# Patient Record
Sex: Female | Born: 1990
Health system: Southern US, Community
[De-identification: ages and names within clinical notes are randomized; demographics above are authoritative.]

## PROBLEM LIST (undated history)

## (undated) ENCOUNTER — Inpatient Hospital Stay (HOSPITAL_COMMUNITY): Payer: Self-pay

## (undated) DIAGNOSIS — F319 Bipolar disorder, unspecified: Secondary | ICD-10-CM

## (undated) DIAGNOSIS — R58 Hemorrhage, not elsewhere classified: Secondary | ICD-10-CM

## (undated) DIAGNOSIS — O139 Gestational [pregnancy-induced] hypertension without significant proteinuria, unspecified trimester: Secondary | ICD-10-CM

## (undated) DIAGNOSIS — F32A Depression, unspecified: Secondary | ICD-10-CM

## (undated) DIAGNOSIS — B999 Unspecified infectious disease: Secondary | ICD-10-CM

## (undated) DIAGNOSIS — R51 Headache: Secondary | ICD-10-CM

## (undated) DIAGNOSIS — R519 Headache, unspecified: Secondary | ICD-10-CM

## (undated) DIAGNOSIS — D649 Anemia, unspecified: Secondary | ICD-10-CM

## (undated) DIAGNOSIS — F329 Major depressive disorder, single episode, unspecified: Secondary | ICD-10-CM

## (undated) HISTORY — PX: FRACTURE SURGERY: SHX138

## (undated) HISTORY — PX: CYSTOLITHOTOMY: SHX1421

## (undated) HISTORY — PX: CYST REMOVAL HAND: SHX6279

---
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
## 2008-10-18 ENCOUNTER — Emergency Department (HOSPITAL_COMMUNITY): Admission: EM | Admit: 2008-10-18 | Discharge: 2008-10-19 | Payer: Self-pay | Admitting: Emergency Medicine

## 2009-05-20 ENCOUNTER — Emergency Department (HOSPITAL_COMMUNITY): Admission: EM | Admit: 2009-05-20 | Discharge: 2009-05-20 | Payer: Self-pay | Admitting: Emergency Medicine

## 2009-12-13 ENCOUNTER — Inpatient Hospital Stay (HOSPITAL_COMMUNITY): Admission: AD | Admit: 2009-12-13 | Discharge: 2009-12-16 | Payer: Self-pay | Admitting: Obstetrics & Gynecology

## 2011-01-23 ENCOUNTER — Emergency Department (HOSPITAL_COMMUNITY)
Admission: EM | Admit: 2011-01-23 | Discharge: 2011-01-23 | Disposition: A | Discharge status: Home / Self Care | Payer: Managed Care, Other (non HMO) | Attending: Emergency Medicine | Admitting: Emergency Medicine

## 2011-01-23 DIAGNOSIS — R3911 Hesitancy of micturition: Secondary | ICD-10-CM | POA: Insufficient documentation

## 2011-01-23 DIAGNOSIS — N390 Urinary tract infection, site not specified: Secondary | ICD-10-CM | POA: Insufficient documentation

## 2011-01-23 DIAGNOSIS — R109 Unspecified abdominal pain: Secondary | ICD-10-CM | POA: Insufficient documentation

## 2011-01-23 DIAGNOSIS — R350 Frequency of micturition: Secondary | ICD-10-CM | POA: Insufficient documentation

## 2011-01-23 DIAGNOSIS — R10819 Abdominal tenderness, unspecified site: Secondary | ICD-10-CM | POA: Insufficient documentation

## 2011-01-23 LAB — URINE MICROSCOPIC-ADD ON

## 2011-01-23 LAB — URINALYSIS, ROUTINE W REFLEX MICROSCOPIC
Glucose, UA: NEGATIVE mg/dL
Hgb urine dipstick: NEGATIVE
Protein, ur: NEGATIVE mg/dL
pH: 7 (ref 5.0–8.0)

## 2011-01-25 LAB — URINE CULTURE
Colony Count: >=100000
Culture  Setup Time: 201203162134

## 2011-01-28 LAB — CBC
HCT: 31.9 % — ABNORMAL LOW (ref 36.0–46.0)
HCT: 37.1 % (ref 36.0–46.0)
Hemoglobin: 10.4 g/dL — ABNORMAL LOW (ref 12.0–15.0)
Hemoglobin: 12.3 g/dL (ref 12.0–15.0)
MCHC: 32.6 g/dL (ref 30.0–36.0)
MCV: 87.7 fL (ref 78.0–100.0)
MCV: 89.1 fL (ref 78.0–100.0)
Platelets: 260 10*3/uL (ref 150–400)
RBC: 3.58 MIL/uL — ABNORMAL LOW (ref 3.87–5.11)
RBC: 4.23 MIL/uL (ref 3.87–5.11)
RDW: 16.6 % — ABNORMAL HIGH (ref 11.5–15.5)
WBC: 16.1 10*3/uL — ABNORMAL HIGH (ref 4.0–10.5)

## 2011-03-24 NOTE — Op Note (Signed)
NAMEESTRELLA, ALCARAZ            ACCOUNT NO.:  [id number]   MEDICAL RECORD NO.:  [id number]          PATIENT TYPE:  AMB   LOCATION:  DSC                          FACILITY:  [facility]   PHYSICIAN:  Tennis Must Meyerdierks, M.D.DATE OF BIRTH:  [birth year]   DATE OF PROCEDURE:  05/02/2007  DATE OF DISCHARGE:                               OPERATIVE REPORT   PREOPERATIVE DIAGNOSIS:  Mass right wrist.   POSTOPERATIVE DIAGNOSIS:  Dorsal ganglion, right wrist.   PROCEDURE:  Excision of dorsal ganglion, right wrist.   SURGEON:  Lowell Bouton, M.D.   ANESTHESIA:  General.   OPERATIVE FINDINGS:  The patient had a small cyst that appeared to arise  from the capsular tissue at the scapholunate interval.  There was  gelatinous fluid in it.   PROCEDURE:  Under general anesthesia with a tourniquet on the right arm,  the right hand was prepped and draped in the usual fashion and after  exsanguinating the limb, the tourniquet was inflated to 250 mmHg.  A  transverse incision was made over the dorsum of the wrist at the  radiocarpal joint.  Sharp dissection was carried through the  subcutaneous tissues and bleeding points were coagulated.  Blunt  dissection was carried through the subcutaneous tissues down to the  extensor tendon sheath and the tendons of the second and fourth dorsal  compartment were retracted radially and ulnarly.  Blunt dissection was  carried down to the capsule and a small dorsal ganglion was identified.  It was sharply dissected out with a knife and there was gelatinous fluid  present.  The cyst was completely excised along with some capsular  tissue.  After completely excising the cyst, the radiocarpal joint was  examined and was found to have no arthritic changes.  The wound was  irrigated copiously with saline.  The capsular tissue was repaired with  4-0 Vicryl.  Subcutaneous tissue was closed with 4-0 Vicryl and the skin  with a 3-0 subcuticular Prolene.   Steri-Strips were applied and 50%  Marcaine was inserted in the skin edges for pain control.  The patient  had a sterile dressing applied along with a volar wrist splint.  She  tolerated the procedure well and went to the recovery room awake and  stable in good condition.      Lowell Bouton, M.D.  Electronically Signed     EMM/MEDQ  D:  05/02/2007  T:  05/02/2007  Job:  [id number]

## 2011-08-14 LAB — URINALYSIS, ROUTINE W REFLEX MICROSCOPIC
Bilirubin Urine: NEGATIVE
Glucose, UA: NEGATIVE mg/dL
Protein, ur: NEGATIVE mg/dL

## 2011-08-14 LAB — WET PREP, GENITAL: Yeast Wet Prep HPF POC: NONE SEEN

## 2011-08-14 LAB — URINE MICROSCOPIC-ADD ON: RBC / HPF: NONE SEEN RBC/hpf (ref <3)

## 2011-08-14 LAB — GC/CHLAMYDIA PROBE AMP, GENITAL: Chlamydia, DNA Probe: NEGATIVE

## 2011-08-14 LAB — RPR: RPR Ser Ql: NONREACTIVE

## 2011-08-14 LAB — PREGNANCY, URINE: Preg Test, Ur: NEGATIVE

## 2012-05-20 ENCOUNTER — Emergency Department (HOSPITAL_COMMUNITY): Payer: Managed Care, Other (non HMO)

## 2012-05-20 ENCOUNTER — Emergency Department (HOSPITAL_COMMUNITY)
Admission: EM | Admit: 2012-05-20 | Discharge: 2012-05-20 | Disposition: A | Discharge status: Home / Self Care | Payer: Managed Care, Other (non HMO) | Attending: Emergency Medicine | Admitting: Emergency Medicine

## 2012-05-20 ENCOUNTER — Encounter (HOSPITAL_COMMUNITY): Payer: Self-pay | Admitting: Emergency Medicine

## 2012-05-20 DIAGNOSIS — R1011 Right upper quadrant pain: Secondary | ICD-10-CM | POA: Insufficient documentation

## 2012-05-20 DIAGNOSIS — N390 Urinary tract infection, site not specified: Secondary | ICD-10-CM

## 2012-05-20 DIAGNOSIS — R109 Unspecified abdominal pain: Secondary | ICD-10-CM

## 2012-05-20 LAB — CBC WITH DIFFERENTIAL/PLATELET
Eosinophils Relative: 1 % (ref 0–5)
HCT: 40.9 % (ref 36.0–46.0)
Lymphocytes Relative: 23 % (ref 12–46)
Lymphs Abs: 1.9 10*3/uL (ref 0.7–4.0)
MCH: 29.8 pg (ref 26.0–34.0)
MCV: 88.9 fL (ref 78.0–100.0)
Monocytes Absolute: 1 10*3/uL (ref 0.1–1.0)
RBC: 4.6 MIL/uL (ref 3.87–5.11)
WBC: 8.3 10*3/uL (ref 4.0–10.5)

## 2012-05-20 LAB — COMPREHENSIVE METABOLIC PANEL
BUN: 10 mg/dL (ref 6–23)
CO2: 25 mEq/L (ref 19–32)
Calcium: 9.7 mg/dL (ref 8.4–10.5)
Creatinine, Ser: 0.76 mg/dL (ref 0.50–1.10)
GFR calc Af Amer: >90 mL/min (ref >90)
GFR calc non Af Amer: >90 mL/min (ref >90)
Glucose, Bld: 89 mg/dL (ref 70–99)

## 2012-05-20 LAB — URINE MICROSCOPIC-ADD ON

## 2012-05-20 LAB — URINALYSIS, ROUTINE W REFLEX MICROSCOPIC
Nitrite: POSITIVE — AB
Protein, ur: NEGATIVE mg/dL
Urobilinogen, UA: 0.2 mg/dL (ref 0.0–1.0)

## 2012-05-20 LAB — POCT PREGNANCY, URINE: Preg Test, Ur: NEGATIVE

## 2012-05-20 LAB — LIPASE, BLOOD: Lipase: 20 U/L (ref 11–59)

## 2012-05-20 MED ORDER — ONDANSETRON HCL 4 MG/2ML IJ SOLN
4.0000 mg | Freq: Once | INTRAMUSCULAR | Status: AC
  Administered 2012-05-20: 4 mg via INTRAVENOUS
  Filled 2012-05-20: qty 2

## 2012-05-20 MED ORDER — ONDANSETRON 4 MG PO TBDP: 4.0000 mg | ORAL_TABLET | Freq: Three times a day (TID) | ORAL | Status: AC | PRN

## 2012-05-20 MED ORDER — NITROFURANTOIN MONOHYD MACRO 100 MG PO CAPS: 100.0000 mg | ORAL_CAPSULE | Freq: Two times a day (BID) | ORAL | Status: AC

## 2012-05-20 MED ORDER — HYDROCODONE-ACETAMINOPHEN 5-325 MG PO TABS: 1.0000 | ORAL_TABLET | ORAL | Status: AC | PRN

## 2012-05-20 MED ORDER — DIPHENOXYLATE-ATROPINE 2.5-0.025 MG PO TABS: 1.0000 | ORAL_TABLET | Freq: Four times a day (QID) | ORAL | Status: AC | PRN

## 2012-05-20 MED ORDER — MORPHINE SULFATE 4 MG/ML IJ SOLN
4.0000 mg | Freq: Once | INTRAMUSCULAR | Status: AC
  Administered 2012-05-20: 4 mg via INTRAVENOUS
  Filled 2012-05-20: qty 1

## 2012-05-20 NOTE — ED Notes (Signed)
Pt c/o RUQ pain x 4 days with diarrhea

## 2012-05-20 NOTE — ED Notes (Signed)
Pt. States RUQ abdominal pain X4 days that does not radiate. Abdomen soft and tender only in RUQ. Denies N/V or fevers. States Diarrhea X2 days.

## 2012-05-20 NOTE — ED Notes (Addendum)
Pt states she started to have rt upper abdominal pain about 4 days ago. Pt rates her pain at a 7 and states that the pain gets worst with movement. Pt states she has not taken anything for the pain today. Pt states she took 2 tylenol yesterday w/o relieve. Pt denies n/v but c/o having diarrhea. Pt states she had a bowel movement twice yesterday but not today. Pt states she has appetite but has not eating a lot in the last 3 days. Pt states she has been drinking fluids regularly

## 2012-05-20 NOTE — ED Provider Notes (Signed)
History     CSN: [id number]  Arrival date & time 05/20/12  1115   First MD Initiated Contact with Patient 05/20/12 1132      Chief Complaint  Patient presents with  . Abdominal Pain    (Consider location/radiation/quality/duration/timing/severity/associated sxs/prior treatment) HPI History from patient. 21 year old female who presents with right upper quadrant abdominal pain which has been present for the past 4 days. She states this is a sharp feeling which worsens with palpation and movement. It improves with rest. She took Tylenol which was minimally helpful in relieving the pain. She has never had this in the past. Associated with diarrhea. She denies any nausea, vomiting, urinary symptoms, vaginal bleeding or discharge. No fever or chills. Normal appetite.  History reviewed. No pertinent past medical history.  History reviewed. No pertinent past surgical history.  History reviewed. No pertinent family history.  History  Substance Use Topics  . Smoking status: Never Smoker   . Smokeless tobacco: Not on file  . Alcohol Use: Yes     occasional    OB History    Grav Para Term Preterm Abortions TAB SAB Ect Mult Living                  Review of Systems  Constitutional: Negative for fever, chills and appetite change.  Respiratory: Negative for shortness of breath.   Cardiovascular: Negative for chest pain.  Gastrointestinal: Positive for abdominal pain and diarrhea. Negative for nausea and vomiting.  Genitourinary: Negative for dysuria, vaginal bleeding and vaginal discharge.  Musculoskeletal: Negative for myalgias.  Skin: Negative for color change and rash.  Neurological: Negative for dizziness and weakness.  All other systems reviewed and are negative.    Allergies  Review of patient's allergies indicates no known allergies.  Home Medications   Current Outpatient Rx  Name Route Sig Dispense Refill  . LAMOTRIGINE 150 MG PO TABS Oral Take 150 mg by mouth at  bedtime.    Marland Kitchen RISPERIDONE 1 MG PO TABS Oral Take 1 mg by mouth at bedtime.      BP 114/65  Pulse 72  Temp 98.6 F (37 C) (Oral)  Resp 20  SpO2 100%  Physical Exam  Nursing note and vitals reviewed. Constitutional: She appears well-developed and well-nourished. No distress.  HENT:  Head: Normocephalic and atraumatic.  Mouth/Throat: Oropharynx is clear and moist.  Eyes:       Normal appearance  Neck: Normal range of motion.  Cardiovascular: Normal rate, regular rhythm and normal heart sounds.   Pulmonary/Chest: Effort normal and breath sounds normal. She exhibits no tenderness.  Abdominal: Soft. Bowel sounds are normal. There is tenderness.       Mildly tender to palpation to the right upper quadrant without rebound or guarding. Negative Murphy's sign.  Musculoskeletal: Normal range of motion.  Neurological: She is alert.  Skin: Skin is warm and dry. She is not diaphoretic.  Psychiatric: She has a normal mood and affect.    ED Course  Procedures (including critical care time)  Labs Reviewed  CBC WITH DIFFERENTIAL - Abnormal; Notable for the following:    Monocytes Relative 13 (*)     All other components within normal limits  COMPREHENSIVE METABOLIC PANEL - Abnormal; Notable for the following:    Total Bilirubin 0.2 (*)     All other components within normal limits  URINALYSIS, ROUTINE W REFLEX MICROSCOPIC - Abnormal; Notable for the following:    APPearance CLOUDY (*)     Hgb urine dipstick  SMALL (*)     Nitrite POSITIVE (*)     Leukocytes, UA MODERATE (*)     All other components within normal limits  URINE MICROSCOPIC-ADD ON - Abnormal; Notable for the following:    Squamous Epithelial / LPF FEW (*)     Bacteria, UA MANY (*)     All other components within normal limits  LIPASE, BLOOD  POCT PREGNANCY, URINE   US Abdomen Complete  05/20/2012  *RADIOLOGY REPORT*  Clinical Data:  Right upper quadrant pain  ULTRASOUND ABDOMEN:  Technique:  Sonography of upper  abdominal structures was performed.  Comparison:  03/19/2005  Gallbladder:  Normally distended without stones or wall thickening. No pericholecystic fluid or sonographic Murphy sign.  Common bile duct:  7.3 mm diameter proximally tapering to 5.5 mm diameter. No intrahepatic biliary dilatation  Liver:  Normal appearance  IVC:  Normal appearance  Pancreas:  Normal appearance  Spleen:  Normal appearance, the 7.7 cm length  Right kidney:  10.5 cm length.  Tiny cyst 1.3 cm greatest size.  No solid mass or hydronephrosis.  Left kidney:  12.2 cm length. Normal morphology without mass or hydronephrosis.  Aorta:  Normal caliber  Other:  No free fluid  IMPRESSION: Tiny right renal cyst. Minimal dilatation of the CBD though this does taper slightly more distally. Correlation with LFTs recommended.  Original Report Authenticated By: Lollie Marrow, M.D.     1. Abdominal pain   2. Urinary tract infection       MDM  Pt presents with pain to RUQ x 4 days. On exam, slight tenderness to the same, neg Murphy's. Normal vitals. Pt's labs appear reassuring with the exception of UTI. No GB disease per [place]. Pt will be treated with macrobid for UTI and prescriptions given for symptomatic tx. Reasons to return for worsening, changing, or new symptoms discussed.        Grant Fontana, PA-C 05/21/12 [phone]

## 2012-05-20 NOTE — ED Notes (Signed)
Reassessed pt pain/ pt stated it is at a 5

## 2012-05-20 NOTE — ED Notes (Signed)
Pt is in ultrasound. Will come to c[facility] after 

## 2012-05-21 NOTE — ED Provider Notes (Signed)
Medical screening examination/treatment/procedure(s) were performed by non-physician practitioner and as supervising physician I was immediately available for consultation/collaboration.  Doug Sou, MD 05/21/12 [phone]

## 2012-07-09 ENCOUNTER — Emergency Department (HOSPITAL_COMMUNITY)
Admission: EM | Admit: 2012-07-09 | Discharge: 2012-07-09 | Disposition: A | Discharge status: Home / Self Care | Payer: Managed Care, Other (non HMO) | Attending: Emergency Medicine | Admitting: Emergency Medicine

## 2012-07-09 ENCOUNTER — Encounter (HOSPITAL_COMMUNITY): Payer: Self-pay | Admitting: *Deleted

## 2012-07-09 DIAGNOSIS — F319 Bipolar disorder, unspecified: Secondary | ICD-10-CM | POA: Insufficient documentation

## 2012-07-09 DIAGNOSIS — R109 Unspecified abdominal pain: Secondary | ICD-10-CM | POA: Insufficient documentation

## 2012-07-09 DIAGNOSIS — N390 Urinary tract infection, site not specified: Secondary | ICD-10-CM | POA: Insufficient documentation

## 2012-07-09 DIAGNOSIS — R110 Nausea: Secondary | ICD-10-CM | POA: Insufficient documentation

## 2012-07-09 HISTORY — DX: Bipolar disorder, unspecified: F31.9

## 2012-07-09 LAB — URINALYSIS, ROUTINE W REFLEX MICROSCOPIC
Bilirubin Urine: NEGATIVE
Glucose, UA: NEGATIVE mg/dL
Ketones, ur: NEGATIVE mg/dL
Leukocytes, UA: NEGATIVE
Nitrite: NEGATIVE
Protein, ur: 100 mg/dL — AB
Specific Gravity, Urine: 1.02 (ref 1.005–1.030)
Urobilinogen, UA: 0.2 mg/dL (ref 0.0–1.0)
pH: 7 (ref 5.0–8.0)

## 2012-07-09 LAB — BASIC METABOLIC PANEL WITH GFR
BUN: 11 mg/dL (ref 6–23)
CO2: 23 meq/L (ref 19–32)
Calcium: 9.2 mg/dL (ref 8.4–10.5)
Chloride: 97 meq/L (ref 96–112)
Creatinine, Ser: 0.93 mg/dL (ref 0.50–1.10)
GFR calc Af Amer: >90 mL/min
GFR calc non Af Amer: 86 mL/min — ABNORMAL LOW
Glucose, Bld: 101 mg/dL — ABNORMAL HIGH (ref 70–99)
Potassium: 3.8 meq/L (ref 3.5–5.1)
Sodium: 132 meq/L — ABNORMAL LOW (ref 135–145)

## 2012-07-09 LAB — CBC
HCT: 36.6 % (ref 36.0–46.0)
Hemoglobin: 12.6 g/dL (ref 12.0–15.0)
MCH: 30.1 pg (ref 26.0–34.0)
MCV: 87.6 fL (ref 78.0–100.0)
RBC: 4.18 MIL/uL (ref 3.87–5.11)
WBC: 16.5 10*3/uL — ABNORMAL HIGH (ref 4.0–10.5)

## 2012-07-09 LAB — PREGNANCY, URINE: Preg Test, Ur: NEGATIVE

## 2012-07-09 LAB — URINE MICROSCOPIC-ADD ON

## 2012-07-09 MED ORDER — DEXTROSE 5 % IV SOLN
1.0000 g | Freq: Once | INTRAVENOUS | Status: AC
  Administered 2012-07-09: 1 g via INTRAVENOUS
  Filled 2012-07-09: qty 10

## 2012-07-09 MED ORDER — ACETAMINOPHEN 500 MG PO TABS
1000.0000 mg | ORAL_TABLET | Freq: Once | ORAL | Status: AC
  Administered 2012-07-09: 1000 mg via ORAL
  Filled 2012-07-09: qty 2

## 2012-07-09 MED ORDER — SODIUM CHLORIDE 0.9 % IV SOLN
Freq: Once | INTRAVENOUS | Status: AC
  Administered 2012-07-09: 16:00:00 via INTRAVENOUS

## 2012-07-09 NOTE — ED Notes (Signed)
Right flank pain began 2 days ago. Seen by PMD yesterday and dx with UTI. Pt states she isn't feeling any better today.

## 2012-07-09 NOTE — ED Provider Notes (Signed)
History   This chart was scribed for Peggy Lennert, MD scribed by Magnus Sinning. The patient was seen in room APA05/APA05 at 1613   CSN: [id number]  Arrival date & time 07/09/12  1447   First MD Initiated Contact with Patient 07/09/12 1613      Chief Complaint  Patient presents with  . Flank Pain    (Consider location/radiation/quality/duration/timing/severity/associated sxs/prior treatment) HPI Comments: Peggy Mckenzie is a 21 y.o. female who presents to the Emergency Department complaining of constant moderate right sided flank pain with associated nausea.  Patient states she found out yesterday with PCP that she had a UTI, and explains she was started on an abx.She states she has taken the abx as prescribed, as well as tylenol with mild relief. Patient denies emesis and does not report any other complaints at this time.   Patient is a 21 y.o. female presenting with flank pain. The history is provided by the patient. No language interpreter was used.  Flank Pain This is a new problem. The current episode started yesterday. The problem occurs constantly. The problem has not changed since onset.Pertinent negatives include no chest pain, no abdominal pain and no headaches. Nothing aggravates the symptoms. Nothing relieves the symptoms. She has tried acetaminophen for the symptoms. The treatment provided mild relief.    Past Medical History  Diagnosis Date  . Bipolar 1 disorder     History reviewed. No pertinent past surgical history.  No family history on file.  History  Substance Use Topics  . Smoking status: Never Smoker   . Smokeless tobacco: Not on file  . Alcohol Use: Yes     occasional    Review of Systems  Constitutional: Negative for fatigue.  HENT: Negative for congestion, sinus pressure and ear discharge.   Eyes: Negative for discharge.  Respiratory: Negative for cough.   Cardiovascular: Negative for chest pain.  Gastrointestinal: Positive for nausea.  Negative for vomiting, abdominal pain and diarrhea.  Genitourinary: Positive for flank pain. Negative for frequency and hematuria.  Musculoskeletal: Negative for back pain.  Skin: Negative for rash.  Neurological: Negative for seizures and headaches.  Hematological: Negative.   Psychiatric/Behavioral: Negative for hallucinations.  All other systems reviewed and are negative.    Allergies  Review of patient's allergies indicates no known allergies.  Home Medications   Current Outpatient Rx  Name Route Sig Dispense Refill  . CIPROFLOXACIN HCL 500 MG PO TABS Oral Take 500 mg by mouth 2 (two) times daily. Patient picked up on 07/08/12    . LAMOTRIGINE 150 MG PO TABS Oral Take 150 mg by mouth at bedtime.      BP 120/68  Pulse 120  Temp 102.9 F (39.4 C) (Oral)  Resp 18  Ht 5\' 3"  (1.6 m)  Wt 160 lb (72.576 kg)  BMI 28.34 kg/m2  SpO2 99%  LMP 06/18/2012  Physical Exam  Nursing note and vitals reviewed. Constitutional: She is oriented to person, place, and time. She appears well-developed and well-nourished. No distress.  HENT:  Head: Normocephalic and atraumatic.  Eyes: Conjunctivae and EOM are normal.  Neck: Neck supple.  Cardiovascular: Normal rate.   Abdominal: She exhibits no distension. There is tenderness. There is no rebound.       Mild right flank tenderness   Musculoskeletal: Normal range of motion. She exhibits no edema.  Neurological: She is alert and oriented to person, place, and time.  Skin: Skin is warm and dry. She is not diaphoretic.  Psychiatric:  She has a normal mood and affect. Her behavior is normal.    ED Course  Procedures (including critical care time) DIAGNOSTIC STUDIES: Oxygen Saturation is 99% on room air, normal by my interpretation.    COORDINATION OF CARE: 16:20: Physical exam started. 16:21: Physical exam completed.  18:33: Performed recheck. Noted results still show the kidney infection and provided recommendations to drink plenty of  fluids, continue to take the abx and tylenol. Patient agreeable. Labs Reviewed  URINALYSIS, ROUTINE W REFLEX MICROSCOPIC - Abnormal; Notable for the following:    Hgb urine dipstick MODERATE (*)     Protein, ur 100 (*)     All other components within normal limits  CBC - Abnormal; Notable for the following:    WBC 16.5 (*)     All other components within normal limits  BASIC METABOLIC PANEL - Abnormal; Notable for the following:    Sodium 132 (*)     Glucose, Bld 101 (*)     GFR calc non Af Amer 86 (*)     All other components within normal limits  URINE MICROSCOPIC-ADD ON - Abnormal; Notable for the following:    Squamous Epithelial / LPF MANY (*)     Bacteria, UA FEW (*)     All other components within normal limits  PREGNANCY, URINE   No results found.   No diagnosis found.    MDM   The chart was scribed for me under my direct supervision.  I personally performed the history, physical, and medical decision making and all procedures in the evaluation of this patient.Peggy Lennert, MD 07/09/12 [phone]

## 2012-07-09 NOTE — ED Notes (Signed)
Pt states she is feeling better .

## 2012-07-09 NOTE — ED Notes (Addendum)
Pt diaphoretic, hot to touch, temp 102.9

## 2012-07-09 NOTE — ED Notes (Signed)
Discharge instructions reviewed with pt, questions answered. Pt verbalized understanding.  

## 2013-06-09 ENCOUNTER — Ambulatory Visit (INDEPENDENT_AMBULATORY_CARE_PROVIDER_SITE_OTHER): Payer: Managed Care, Other (non HMO) | Admitting: Family Medicine

## 2013-06-09 ENCOUNTER — Encounter: Payer: Self-pay | Admitting: Family Medicine

## 2013-06-09 VITALS — BP 110/72 | HR 78 | Temp 97.7°F | Resp 18 | Wt 170.0 lb

## 2013-06-09 DIAGNOSIS — R109 Unspecified abdominal pain: Secondary | ICD-10-CM

## 2013-06-09 LAB — CBC WITH DIFFERENTIAL/PLATELET
Basophils Absolute: 0 10*3/uL (ref 0.0–0.1)
Basophils Relative: 0 % (ref 0–1)
Eosinophils Absolute: 0.2 10*3/uL (ref 0.0–0.7)
Eosinophils Relative: 2 % (ref 0–5)
MCH: 30.6 pg (ref 26.0–34.0)
MCV: 89 fL (ref 78.0–100.0)
Neutrophils Relative %: 65 % (ref 43–77)
Platelets: 331 10*3/uL (ref 150–400)
RBC: 4.44 MIL/uL (ref 3.87–5.11)
RDW: 13.4 % (ref 11.5–15.5)

## 2013-06-09 MED ORDER — NAPROXEN 500 MG PO TABS: 500.0000 mg | ORAL_TABLET | Freq: Two times a day (BID) | ORAL | Status: DC

## 2013-06-09 NOTE — Progress Notes (Signed)
  Subjective:    Patient ID: Peggy Mckenzie, female    DOB: 13-Oct-1991, 22 y.o.   MRN: [id number]  HPI  Patient here with left side pain for the past 6 weeks. She states in the beginning was on and off and will be a dull ache for about an hour. Over the past week she's had increased pain still described as a dull aching in her left side it is nonradiating. She denies any particular injury. Denies any upper respiratory infection or any cough. She denies any dysuria. She did state that a few episodes the pain was worse with eating she denies any nausea vomiting. She has not taken any medication.  Review of Systems - per above  GEN- denies fatigue, fever, weight loss,weakness, recent illness CVS- denies chest pain, palpitations RESP- denies SOB, cough, wheeze ABD- denies N/V, change in stools, abd pain GU- denies dysuria, hematuria, dribbling, incontinence MSK- denies joint pain, +muscle aches, injury        Objective:   Physical Exam GEN- NAD, alert and oriented x3 CVS- RRR, no murmur RESP-CTAB ABD-NABS,soft,NT,ND, no CVA tenderness, no HSM, neg Rosvings MSK- TTP over T11 left side, no brusing noted, Spine NT, neg SLR, HIP FROM EXT- No edema Pulses- Radial 2+        Assessment & Plan:

## 2013-06-09 NOTE — Patient Instructions (Addendum)
Use the heating pad  Take the naprosyn twice a day with food We will call with labs

## 2013-06-10 LAB — COMPREHENSIVE METABOLIC PANEL
ALT: 9 U/L (ref 0–35)
Albumin: 4.6 g/dL (ref 3.5–5.2)
Alkaline Phosphatase: 58 U/L (ref 39–117)
CO2: 25 mEq/L (ref 19–32)
Glucose, Bld: 72 mg/dL (ref 70–99)
Potassium: 4.5 mEq/L (ref 3.5–5.3)
Sodium: 137 mEq/L (ref 135–145)
Total Bilirubin: 0.5 mg/dL (ref 0.3–1.2)
Total Protein: 6.9 g/dL (ref 6.0–8.3)

## 2013-06-11 ENCOUNTER — Encounter: Payer: Self-pay | Admitting: Family Medicine

## 2013-06-11 DIAGNOSIS — R109 Unspecified abdominal pain: Secondary | ICD-10-CM | POA: Insufficient documentation

## 2013-06-11 NOTE — Assessment & Plan Note (Signed)
I think this is more MSK pain, ? Related to her diet. Will check [facility] for LFT and alk phos Advised NSAIDS and heating pad ABdominal exam is benign, no urinary symptoms, no vaginal symptoms

## 2013-06-28 ENCOUNTER — Other Ambulatory Visit: Payer: Self-pay | Admitting: Family Medicine

## 2013-06-28 ENCOUNTER — Ambulatory Visit (INDEPENDENT_AMBULATORY_CARE_PROVIDER_SITE_OTHER): Payer: Managed Care, Other (non HMO) | Admitting: Family Medicine

## 2013-06-28 ENCOUNTER — Encounter: Payer: Self-pay | Admitting: Family Medicine

## 2013-06-28 VITALS — BP 130/80 | HR 70 | Temp 99.0°F | Ht 61.5 in | Wt 171.0 lb

## 2013-06-28 DIAGNOSIS — B9689 Other specified bacterial agents as the cause of diseases classified elsewhere: Secondary | ICD-10-CM

## 2013-06-28 DIAGNOSIS — N760 Acute vaginitis: Secondary | ICD-10-CM

## 2013-06-28 DIAGNOSIS — R109 Unspecified abdominal pain: Secondary | ICD-10-CM

## 2013-06-28 DIAGNOSIS — A499 Bacterial infection, unspecified: Secondary | ICD-10-CM

## 2013-06-28 LAB — URINALYSIS, ROUTINE W REFLEX MICROSCOPIC
Hgb urine dipstick: NEGATIVE
Ketones, ur: NEGATIVE mg/dL
Leukocytes, UA: NEGATIVE
Nitrite: NEGATIVE
Protein, ur: NEGATIVE mg/dL
Urobilinogen, UA: 0.2 mg/dL (ref 0.0–1.0)

## 2013-06-28 LAB — URINALYSIS, MICROSCOPIC ONLY
Casts: NONE SEEN
Crystals: NONE SEEN

## 2013-06-28 LAB — WET PREP FOR TRICH, YEAST, CLUE: Trich, Wet Prep: NONE SEEN

## 2013-06-28 MED ORDER — METRONIDAZOLE 500 MG PO TABS: 500.0000 mg | ORAL_TABLET | Freq: Two times a day (BID) | ORAL | Status: DC

## 2013-06-28 NOTE — Progress Notes (Signed)
  Subjective:    Patient ID: Peggy Mckenzie, female    DOB: 24-Jan-1991, 24 y.o.   MRN: [id number]  HPI  Pt here with right lower abdominal and pelvic pain for the past week. Feel sharp pains on and off. Denies change in stool, +nausea, no emesis. Denies dysuria. LMP 8/2 but only lasted 2 days. Mild vaginal discharge but no itching or foul odor. She did note things floating in urine.     Review of Systems   GEN- denies fatigue, fever, weight loss,weakness, recent illness CVS- denies chest pain, palpitations RESP- denies SOB, cough, wheeze ABD- denies N/V, change in stools, +abd pain GU- denies dysuria, hematuria, dribbling, incontinence MSK- denies joint pain, muscle aches, injury Neuro- denies headache, dizziness, syncope, seizure activity      Objective:   Physical Exam GEN- NAD, alert and oriented x3 CVS- RRR, no murmur RESP-CTAB ABD-NABS,soft,ND,mild TTP right side of pelvis, no no mass felt no rebound GU- normal external genitalia, no bleeding noted, yellow discharge from os, no CMT, uterus normal size, no ovarian mass, vaginal mucosa pink EXT- No edema Pulses- Radial, DP- 2+        Assessment & Plan:

## 2013-06-28 NOTE — Patient Instructions (Signed)
Take the antibiotics as prescribed If your pain worsens you will need CT scan  F/U as needed

## 2013-06-29 DIAGNOSIS — B9689 Other specified bacterial agents as the cause of diseases classified elsewhere: Secondary | ICD-10-CM | POA: Insufficient documentation

## 2013-06-29 DIAGNOSIS — R109 Unspecified abdominal pain: Secondary | ICD-10-CM | POA: Insufficient documentation

## 2013-06-29 NOTE — Assessment & Plan Note (Signed)
Seen earlier this month for left side pain now resolved Her abd exam is fairly benign, doubt appendicitis , pain more in pelvic region when she is pointing UA neg, U preg neg Recent labs neg Treat for BV If pain worsens obtain CT abd/pelvis

## 2013-06-29 NOTE — Assessment & Plan Note (Signed)
Flagyl x 7 days

## 2013-09-14 ENCOUNTER — Ambulatory Visit (INDEPENDENT_AMBULATORY_CARE_PROVIDER_SITE_OTHER): Payer: Managed Care, Other (non HMO) | Admitting: Physician Assistant

## 2013-09-14 ENCOUNTER — Other Ambulatory Visit: Payer: Self-pay | Admitting: Physician Assistant

## 2013-09-14 ENCOUNTER — Encounter: Payer: Self-pay | Admitting: Physician Assistant

## 2013-09-14 VITALS — BP 118/84 | HR 68 | Temp 98.5°F | Resp 18 | Wt 175.0 lb

## 2013-09-14 DIAGNOSIS — N760 Acute vaginitis: Secondary | ICD-10-CM

## 2013-09-14 DIAGNOSIS — N912 Amenorrhea, unspecified: Secondary | ICD-10-CM

## 2013-09-14 DIAGNOSIS — B9689 Other specified bacterial agents as the cause of diseases classified elsewhere: Secondary | ICD-10-CM

## 2013-09-14 DIAGNOSIS — A499 Bacterial infection, unspecified: Secondary | ICD-10-CM

## 2013-09-14 LAB — PREGNANCY, URINE: Preg Test, Ur: NEGATIVE

## 2013-09-14 LAB — WET PREP FOR TRICH, YEAST, CLUE

## 2013-09-14 MED ORDER — METRONIDAZOLE 500 MG PO TABS: 500.0000 mg | ORAL_TABLET | Freq: Two times a day (BID) | ORAL | Status: DC

## 2013-09-14 NOTE — Progress Notes (Signed)
   Patient ID: SHAMIYAH NGU MRN: [id number], DOB: 05/28/1991, 22 y.o. Date of Encounter: 09/14/2013, 4:21 PM    Chief Complaint:  Chief Complaint  Patient presents with  . c/o vag inf, late menses     HPI: 22 y.o. year old female has complaints of vaginal itching and irritation. Also has noticed some vaginal white discharge.  Also reports that her periods are usually very regular and every 28 days. Says that her last period was 08/11/13. Says she has done a home pregnancy test that was negative but wanted to get this checked.     Home Meds: See attached medication section for any medications that were entered at today's visit. The computer does not put those onto this list.The following list is a list of meds entered prior to today's visit.   Current Outpatient Prescriptions on File Prior to Visit  Medication Sig Dispense Refill  . lamoTRIgine ([name]) 150 MG tablet Take 150 mg by mouth at bedtime.       No current facility-administered medications on file prior to visit.    Allergies: No Known Allergies    Review of Systems: See HPI for pertinent ROS. All other ROS negative.    Physical Exam: Blood pressure 118/84, pulse 68, temperature 98.5 F (36.9 C), temperature source Oral, resp. rate 18, weight 175 lb (79.379 kg), last menstrual period 08/11/2013., Body mass index is 32.53 kg/(m^2). General:  Alert well-developed Hispanic female .Appears in no acute distress. Lungs: Clear bilaterally to auscultation without wheezes, rales, or rhonchi. Breathing is unlabored. Heart: Regular rhythm. No murmurs, rubs, or gallops. Abdomen: Soft, non-tender, non-distended with normoactive bowel sounds. No hepatomegaly. No rebound/guarding. No obvious abdominal masses. Msk:  Strength and tone normal for age. Pelvic exam: External genitalia normal. Vaginal mucosa normal. Cervix normal. There is a moderate amount of liquidy white discharge. Bimanual exam is normal with no cervical motion  tenderness and no adnexal or uterine mass. Extremities/Skin: Warm and dry. No clubbing or cyanosis. No edema. No rashes or suspicious lesions. Neuro: Alert and oriented X 3. Moves all extremities spontaneously. Gait is normal. CNII-XII grossly in tact. Psych:  Responds to questions appropriately with a normal affect.   Results for orders placed in visit on 09/14/13  WET PREP FOR TRICH, YEAST, CLUE      Result Value Range   Yeast Wet Prep HPF POC NONE SEEN  NONE SEEN   Trich, Wet Prep NONE SEEN  NONE SEEN   Clue Cells Wet Prep HPF POC FEW (*) NONE SEEN   WBC, Wet Prep HPF POC FEW  NONE SEEN  PREGNANCY, URINE      Result Value Range   Preg Test, Ur NEG       ASSESSMENT AND PLAN:  22 y.o. year old female with  1. Bacterial vaginosis - metroNIDAZOLE (FLAGYL) 500 MG tablet; Take 1 tablet (500 mg total) by mouth 2 (two) times daily.  Dispense: 14 tablet; Refill: 0  2. Vaginitis and vulvovaginitis - WET PREP FOR TRICH, YEAST, CLUE - GC/chlamydia probe amp, genital  3. Amenorrhea Discussed that her irregular menses may be secondary to hormone fluctuations. Also can occur with stress. Told her to monitor for now. If menses continue to be irregular in followup for further evaluation. As well I discussed contraception and she says that she does not want contraception. - Pregnancy, urine   Signed, [address] [place], [place], [facility] 09/14/2013 4:21 PM

## 2013-09-15 ENCOUNTER — Ambulatory Visit: Payer: Managed Care, Other (non HMO) | Admitting: Family Medicine

## 2013-09-15 LAB — GC/CHLAMYDIA PROBE AMP: CT Probe RNA: NEGATIVE

## 2013-09-24 ENCOUNTER — Encounter (HOSPITAL_COMMUNITY): Payer: Self-pay | Admitting: Emergency Medicine

## 2013-09-24 DIAGNOSIS — B86 Scabies: Secondary | ICD-10-CM | POA: Insufficient documentation

## 2013-09-24 DIAGNOSIS — Z79899 Other long term (current) drug therapy: Secondary | ICD-10-CM | POA: Insufficient documentation

## 2013-09-24 DIAGNOSIS — J40 Bronchitis, not specified as acute or chronic: Secondary | ICD-10-CM | POA: Insufficient documentation

## 2013-09-24 DIAGNOSIS — Z792 Long term (current) use of antibiotics: Secondary | ICD-10-CM | POA: Insufficient documentation

## 2013-09-24 DIAGNOSIS — F319 Bipolar disorder, unspecified: Secondary | ICD-10-CM | POA: Insufficient documentation

## 2013-09-25 ENCOUNTER — Emergency Department (HOSPITAL_COMMUNITY)
Admission: EM | Admit: 2013-09-25 | Discharge: 2013-09-25 | Disposition: A | Discharge status: Home / Self Care | Payer: Managed Care, Other (non HMO) | Attending: Emergency Medicine | Admitting: Emergency Medicine

## 2013-09-25 DIAGNOSIS — J40 Bronchitis, not specified as acute or chronic: Secondary | ICD-10-CM

## 2013-09-25 DIAGNOSIS — B029 Zoster without complications: Secondary | ICD-10-CM

## 2013-09-25 MED ORDER — FAMCICLOVIR 500 MG PO TABS: 500.0000 mg | ORAL_TABLET | Freq: Three times a day (TID) | ORAL | Status: DC

## 2013-09-25 MED ORDER — ONDANSETRON HCL 4 MG PO TABS
4.0000 mg | ORAL_TABLET | Freq: Once | ORAL | Status: AC
  Administered 2013-09-25: 4 mg via ORAL
  Filled 2013-09-25: qty 1

## 2013-09-25 MED ORDER — KETOROLAC TROMETHAMINE 10 MG PO TABS
10.0000 mg | ORAL_TABLET | Freq: Once | ORAL | Status: AC
  Administered 2013-09-25: 10 mg via ORAL
  Filled 2013-09-25: qty 1

## 2013-09-25 MED ORDER — PREDNISONE 50 MG PO TABS
60.0000 mg | ORAL_TABLET | Freq: Once | ORAL | Status: AC
  Administered 2013-09-25: 60 mg via ORAL
  Filled 2013-09-25 (×2): qty 1

## 2013-09-25 MED ORDER — PREDNISONE 10 MG PO TABS: ORAL_TABLET | ORAL | Status: DC

## 2013-09-25 MED ORDER — ACYCLOVIR 800 MG PO TABS
800.0000 mg | ORAL_TABLET | Freq: Once | ORAL | Status: AC
  Administered 2013-09-25: 800 mg via ORAL
  Filled 2013-09-25: qty 1

## 2013-09-25 MED ORDER — HYDROCODONE-ACETAMINOPHEN 5-325 MG PO TABS: 1.0000 | ORAL_TABLET | ORAL | Status: DC | PRN

## 2013-09-25 MED ORDER — HYDROCODONE-ACETAMINOPHEN 5-325 MG PO TABS
1.0000 | ORAL_TABLET | Freq: Once | ORAL | Status: AC
  Administered 2013-09-25: 1 via ORAL
  Filled 2013-09-25: qty 1

## 2013-09-25 NOTE — ED Provider Notes (Signed)
CSN: [id number]     Arrival date & time 09/24/13  2305 History   First MD Initiated Contact with Patient 09/24/13 2325     Chief Complaint  Patient presents with  . Arm Pain  . Insect Bite   (Consider location/radiation/quality/duration/timing/severity/associated sxs/prior Treatment) Patient is a 22 y.o. female presenting with rash. The history is provided by the patient.  Rash Location:  Torso Torso rash location:  Upper back and L chest Quality: blistering, burning, painful and redness   Quality: not bruising, not draining, not scaling and not weeping   Pain details:    Quality:  Aching and burning   Severity:  Moderate   Onset quality:  Gradual   Duration:  4 days   Progression:  Worsening Severity:  Moderate Onset quality:  Gradual Timing:  Constant Progression:  Worsening Chronicity:  New Context: not animal contact, not hot tub use, not medications, not new detergent/soap and not plant contact   Context comment:  Pt is unsure of insect bite. Relieved by:  Nothing Worsened by:  Nothing tried Associated symptoms: no abdominal pain, no fever, no headaches, no hoarse voice, no joint pain, no nausea, no periorbital edema, no shortness of breath, no throat swelling, no tongue swelling and not wheezing     Past Medical History  Diagnosis Date  . Bipolar 1 disorder    History reviewed. No pertinent past surgical history. No family history on file. History  Substance Use Topics  . Smoking status: Never Smoker   . Smokeless tobacco: Not on file  . Alcohol Use: Yes     Comment: occasional   OB History   Grav Para Term Preterm Abortions TAB SAB Ect Mult Living                 Review of Systems  Constitutional: Negative for fever and activity change.       All ROS Neg except as noted in HPI  HENT: Negative for hoarse voice and nosebleeds.   Eyes: Negative for photophobia and discharge.  Respiratory: Positive for cough. Negative for shortness of breath and wheezing.     Cardiovascular: Negative for chest pain and palpitations.  Gastrointestinal: Negative for nausea, abdominal pain and blood in stool.  Genitourinary: Negative for dysuria, frequency and hematuria.  Musculoskeletal: Negative for arthralgias, back pain and neck pain.  Skin: Positive for rash.  Neurological: Negative for dizziness, seizures, speech difficulty and headaches.  Psychiatric/Behavioral: Negative for hallucinations and confusion.    Allergies  Review of patient's allergies indicates no known allergies.  Home Medications   Current Outpatient Rx  Name  Route  Sig  Dispense  Refill  . lamoTRIgine ([name]) 100 MG tablet   Oral   Take 50 mg by mouth every morning.         . lamoTRIgine ([name]) 150 MG tablet   Oral   Take 150 mg by mouth at bedtime.         . metroNIDAZOLE (FLAGYL) 500 MG tablet   Oral   Take 1 tablet (500 mg total) by mouth 2 (two) times daily.   14 tablet   0   . famciclovir (FAMVIR) 500 MG tablet   Oral   Take 1 tablet (500 mg total) by mouth 3 (three) times daily.   21 tablet   0   . HYDROcodone-acetaminophen (NORCO) 5-325 MG per tablet   Oral   Take 1 tablet by mouth every 4 (four) hours as needed for moderate pain.   20  tablet   0   . predniSONE (DELTASONE) 10 MG tablet      5,4,3,2,1 - take with food   15 tablet   0    BP 147/72  Pulse 91  Temp(Src) 98.2 F (36.8 C) (Oral)  Resp 16  Ht 5\' 3"  (1.6 m)  Wt 174 lb (78.926 kg)  BMI 30.83 kg/m2  SpO2 100%  LMP 08/11/2013 Physical Exam  Nursing note and vitals reviewed. Constitutional: She is oriented to person, place, and time. She appears well-developed and well-nourished.  Non-toxic appearance.  HENT:  Head: Normocephalic.  Right Ear: Tympanic membrane and external ear normal.  Left Ear: Tympanic membrane and external ear normal.  Eyes: EOM and lids are normal. Pupils are equal, round, and reactive to light.  Neck: Normal range of motion. Neck supple. Carotid bruit is  not present.  Cardiovascular: Normal rate, regular rhythm, normal heart sounds, intact distal pulses and normal pulses.   Pulmonary/Chest: No respiratory distress. She has rhonchi.      Red raised lesions that seem to follow a dermatone. Painful to palpation. One lesion has a small blister. Chaperone present during exam. Bilat rhonchi present. Symmetrical rise and fall of the chest. Pt speaks in complete sentences.  Abdominal: Soft. Bowel sounds are normal. There is no tenderness. There is no guarding.  Musculoskeletal: Normal range of motion.  Lymphadenopathy:       Head (right side): No submandibular adenopathy present.       Head (left side): No submandibular adenopathy present.    She has no cervical adenopathy.  Neurological: She is alert and oriented to person, place, and time. She has normal strength. No cranial nerve deficit or sensory deficit.  Grip symmetrical. No motor or sensory deficit of the upper extremities.  Skin: Skin is warm and dry.  Psychiatric: She has a normal mood and affect. Her speech is normal.    ED Course  Procedures (including critical care time) Labs Review Labs Reviewed - No data to display Imaging Review No results found.  EKG Interpretation   None       MDM   1. Shingles   2. Bronchitis    **I have reviewed nursing notes, vital signs, and all appropriate lab and imaging results for this patient.*  Pt has a rash that is consistent with Shingles. Will treat with famvir and norco. Pt has course rhonchi present on lung exam. Will treat with prednisone and increase fluids. Pt to see her primary MD for rcheck in the office.  Pt reported pain in the right arm from the wrist to just above the elbow. No neuro-vascular deficit noted. Pt wil follow up with Dr Romeo Apple for orthopedic evaluation.  Kathie Dike, PA-C 09/25/13 0112  Kathie Dike, PA-C 09/25/13 1651

## 2013-09-25 NOTE — ED Provider Notes (Signed)
Medical screening examination/treatment/procedure(s) were performed by non-physician practitioner and as supervising physician I was immediately available for consultation/collaboration.    Vida Roller, MD 09/25/13 2250

## 2013-11-09 NOTE — L&D Delivery Note (Addendum)
Patient was C/C/+3 and pushed for 12 minutes with epidural.   NSVD  female infant, Apgars 8,9, weight P.   The patient had no lacerations. Fundus was firm. EBL was expected amount. Placenta was delivered intact. Vagina was clear.  Baby was vigorous and doing skin to skin with mother.  Peggy Mckenzie

## 2013-11-29 ENCOUNTER — Ambulatory Visit (INDEPENDENT_AMBULATORY_CARE_PROVIDER_SITE_OTHER): Payer: Self-pay | Admitting: Physician Assistant

## 2013-11-29 ENCOUNTER — Encounter: Payer: Self-pay | Admitting: Physician Assistant

## 2013-11-29 VITALS — BP 120/70 | HR 68 | Temp 98.5°F | Resp 18 | Wt 174.0 lb

## 2013-11-29 DIAGNOSIS — R112 Nausea with vomiting, unspecified: Secondary | ICD-10-CM

## 2013-11-29 DIAGNOSIS — A084 Viral intestinal infection, unspecified: Secondary | ICD-10-CM

## 2013-11-29 DIAGNOSIS — R197 Diarrhea, unspecified: Secondary | ICD-10-CM

## 2013-11-29 DIAGNOSIS — A088 Other specified intestinal infections: Secondary | ICD-10-CM

## 2013-11-29 LAB — INFLUENZA A AND B
INFLUENZA A AG: NEGATIVE
INFLUENZA B AG: NEGATIVE

## 2013-11-29 NOTE — Progress Notes (Signed)
    Patient ID: Peggy EdmanStephanie R Marquess MRN: [id number], DOB: Mar 14, 1991, 22 y.o. Date of Encounter: 11/29/2013, 3:10 PM    Chief Complaint:  Chief Complaint  Patient presents with  . nausea,chills, HA, diarrhea    x 1 day     HPI: 23 y.o. year old female states that yesterday  her stomach just seemed to be "off." Last night she developed nausea headache and diarrhea.  Today she has also had body aches and chills. She has had no vomiting. Has had 4 episodes of diarrhea today. 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 She went to work this morning but they sent her home. She is scheduled to be back to work tomorrow. She has just had some gurgily feeling in her abdomen but no focal/localized/significant amount of pain in her abdomen. No fevers.     Home Meds: See attached medication section for any medications that were entered at today's visit. The computer does not put those onto this list.The following list is a list of meds entered prior to today's visit.   Current Outpatient Prescriptions on File Prior to Visit  Medication Sig Dispense Refill  . lamoTRIgine ([name]) 100 MG tablet Take 50 mg by mouth every morning.      . lamoTRIgine ([name]) 150 MG tablet Take 150 mg by mouth at bedtime.       No current facility-administered medications on file prior to visit.    Allergies: No Known Allergies    Review of Systems: See HPI for pertinent ROS. All other ROS negative.    Physical Exam: Blood pressure 120/70, pulse 68, temperature 98.5 F (36.9 C), temperature source Oral, resp. rate 18, weight 174 lb (78.926 kg), last menstrual period 11/09/2013., Body mass index is 30.83 kg/(m^2). General: WNWD Female. Appears in no acute distress. Lungs: Clear bilaterally to auscultation without wheezes, rales, or rhonchi. Breathing is unlabored. Heart: Regular rhythm. No murmurs, rubs, or gallops. Abdomen: Soft, non-tender, non-distended with normoactive bowel sounds. No  hepatomegaly. No rebound/guarding. No obvious abdominal masses. Even with palpation, there is no area of tenderness. Even the Peri-umbilical and right lower quadrants are not tender with palpation. Msk:  Strength and tone normal for age. Extremities/Skin: Warm and dry. No clubbing or cyanosis. No edema. No rashes or suspicious lesions. Neuro: Alert and oriented X 3. Moves all extremities spontaneously. Gait is normal. CNII-XII grossly in tact. Psych:  Responds to questions appropriately with a normal affect.     ASSESSMENT AND PLAN:  23 y.o. year old female with  1. Viral gastroenteritis Discussed this is a virus and should resolve on its own. Discussed that in the meantime we need to prevent dehydration as she is losing extra fluid with this the diarrhea. Drink frequent small sips of fluids such as ginger ale frequently. Clear liquid diet including ginger ale and chicken noodle soup. Add crackers as tolerated. Once symptoms improve, gradually advance to a bland diet.  Note given for out of work for today and tomorrow. If Still with diarrhea tomorrow, call us and we will write her for out of work further. Follow up if symptoms worsen or she develops new symptoms especially fever or focal/localized abdominal pain.  2. Nausea vomiting and diarrhea - Influenza a and b: Negative   Signed, [address]Mary Beth CusterDixon, [place]PA, [facility] 11/29/2013 3:10 PM

## 2013-11-30 ENCOUNTER — Telehealth: Payer: Self-pay | Admitting: Family Medicine

## 2013-11-30 NOTE — Telephone Encounter (Signed)
Still with some diarrhea.  Needs one more day home before returning to work.

## 2013-12-04 NOTE — Telephone Encounter (Signed)
Pt was to call me back and tell me whether she was going to pick up note or have us fax to employer.  She never called back

## 2014-01-15 ENCOUNTER — Inpatient Hospital Stay (HOSPITAL_COMMUNITY)
Admission: AD | Admit: 2014-01-15 | Discharge: 2014-01-15 | Disposition: A | Discharge status: Home / Self Care | Payer: BC Managed Care – PPO | Source: Ambulatory Visit | Attending: Obstetrics and Gynecology | Admitting: Obstetrics and Gynecology

## 2014-01-15 ENCOUNTER — Encounter (HOSPITAL_COMMUNITY): Payer: Self-pay | Admitting: *Deleted

## 2014-01-15 DIAGNOSIS — O034 Incomplete spontaneous abortion without complication: Secondary | ICD-10-CM

## 2014-01-15 DIAGNOSIS — R109 Unspecified abdominal pain: Secondary | ICD-10-CM | POA: Insufficient documentation

## 2014-01-15 DIAGNOSIS — F319 Bipolar disorder, unspecified: Secondary | ICD-10-CM | POA: Insufficient documentation

## 2014-01-15 DIAGNOSIS — N898 Other specified noninflammatory disorders of vagina: Secondary | ICD-10-CM | POA: Insufficient documentation

## 2014-01-15 LAB — WET PREP, GENITAL
Clue Cells Wet Prep HPF POC: NONE SEEN
Trich, Wet Prep: NONE SEEN
Yeast Wet Prep HPF POC: NONE SEEN

## 2014-01-15 LAB — CBC
HCT: 39.1 % (ref 36.0–46.0)
Hemoglobin: 13 g/dL (ref 12.0–15.0)
MCH: 29.3 pg (ref 26.0–34.0)
MCHC: 33.2 g/dL (ref 30.0–36.0)
MCV: 88.1 fL (ref 78.0–100.0)
PLATELETS: 278 10*3/uL (ref 150–400)
RBC: 4.44 MIL/uL (ref 3.87–5.11)
RDW: 12.3 % (ref 11.5–15.5)
WBC: 8.2 10*3/uL (ref 4.0–10.5)

## 2014-01-15 LAB — URINALYSIS, ROUTINE W REFLEX MICROSCOPIC
Bilirubin Urine: NEGATIVE
Glucose, UA: NEGATIVE mg/dL
KETONES UR: NEGATIVE mg/dL
LEUKOCYTES UA: NEGATIVE
NITRITE: NEGATIVE
PH: 6 (ref 5.0–8.0)
Protein, ur: NEGATIVE mg/dL
SPECIFIC GRAVITY, URINE: 1.015 (ref 1.005–1.030)
UROBILINOGEN UA: 0.2 mg/dL (ref 0.0–1.0)

## 2014-01-15 LAB — POCT PREGNANCY, URINE: PREG TEST UR: NEGATIVE

## 2014-01-15 LAB — ABO/RH: ABO/RH(D): O POS

## 2014-01-15 LAB — URINE MICROSCOPIC-ADD ON

## 2014-01-15 LAB — HCG, QUANTITATIVE, PREGNANCY: hCG, Beta Chain, Quant, S: 10 m[IU]/mL — ABNORMAL HIGH (ref <5)

## 2014-01-15 MED ORDER — PROMETHAZINE HCL 25 MG PO TABS: 25.0000 mg | ORAL_TABLET | Freq: Four times a day (QID) | ORAL | Status: DC | PRN

## 2014-01-15 MED ORDER — HYDROCODONE-ACETAMINOPHEN 5-325 MG PO TABS: 1.0000 | ORAL_TABLET | Freq: Four times a day (QID) | ORAL | Status: DC | PRN

## 2014-01-15 NOTE — MAU Provider Note (Signed)
History     CSN: [id number]  Arrival date and time: 01/15/14 1615   None     Chief Complaint  Patient presents with  . Vaginal Bleeding  . Abdominal Pain   HPI This is a 23 y.o. female at early gestation who presents with bleeding   Had 5 + UPTs at home but they were all very light lines. Started seeing blood with wiping today. Mild cramping.  Has appt next week for New OB. Has not been to [facility] office since last year. Last pregnancy there was normal, vaginal delivery with no complications.   RN Note: 5 pos HPT's last week, appt @ [facility] next week. Noted blood with wiping @ 1330, sharp lower abd & pelvic pain.       OB History   Grav Para Term Preterm Abortions TAB SAB Ect Mult Living   1 1 1       1       Past Medical History  Diagnosis Date  . Bipolar 1 disorder     Past Surgical History  Procedure Laterality Date  . Fracture surgery    . Cyst removal hand      History reviewed. No pertinent family history.  History  Substance Use Topics  . Smoking status: Never Smoker   . Smokeless tobacco: Never Used  . Alcohol Use: Yes     Comment: occasional    Allergies: No Known Allergies  Prescriptions prior to admission  Medication Sig Dispense Refill  . lamoTRIgine ([name]) 150 MG tablet Take 150 mg by mouth at bedtime.      . Prenatal Vit-Fe Fumarate-FA (PRENATAL MULTIVITAMIN) TABS tablet Take 1 tablet by mouth daily at 12 noon.      . traZODone (DESYREL) 50 MG tablet Take 25 mg by mouth at bedtime as needed for sleep.        Review of Systems  Constitutional: Negative for fever, chills and malaise/fatigue.  Gastrointestinal: Positive for abdominal pain. Negative for nausea, vomiting, diarrhea and constipation.  Genitourinary: Negative for dysuria.       Light bleeding   Neurological: Negative for dizziness and headaches.   Physical Exam   Blood pressure 132/80, pulse 71, temperature 98.3 F (36.8 C), temperature source Oral, resp. rate 18, height  5' 2.5" (1.588 m), weight 78.926 kg (174 lb), last menstrual period 12/10/2013.  Physical Exam  Constitutional: She is oriented to person, place, and time. She appears well-developed and well-nourished. No distress.  HENT:  Head: Normocephalic.  Cardiovascular: Normal rate.   Respiratory: Effort normal.  GI: Soft. She exhibits no distension and no mass. There is tenderness (suprapubic). There is no rebound and no guarding.  Genitourinary: Uterus normal. Vaginal discharge (moderate blood in vault) found.  Uterus tender, small Adnexa tender bilaterally  Musculoskeletal: Normal range of motion.  Neurological: She is alert and oriented to person, place, and time.  Skin: Skin is warm and dry.  Psychiatric: She has a normal mood and affect.    MAU Course  Procedures  MDM Results for orders placed during the hospital encounter of 01/15/14 (from the past 24 hour(s))  URINALYSIS, ROUTINE W REFLEX MICROSCOPIC     Status: Abnormal   Collection Time    01/15/14  4:35 PM      Result Value Ref Range   Color, Urine YELLOW  YELLOW   APPearance CLEAR  CLEAR   Specific Gravity, Urine 1.015  1.005 - 1.030   pH 6.0  5.0 - 8.0   Glucose,  UA NEGATIVE  NEGATIVE mg/dL   Hgb urine dipstick SMALL (*) NEGATIVE   Bilirubin Urine NEGATIVE  NEGATIVE   Ketones, ur NEGATIVE  NEGATIVE mg/dL   Protein, ur NEGATIVE  NEGATIVE mg/dL   Urobilinogen, UA 0.2  0.0 - 1.0 mg/dL   Nitrite NEGATIVE  NEGATIVE   Leukocytes, UA NEGATIVE  NEGATIVE  URINE MICROSCOPIC-ADD ON     Status: None   Collection Time    01/15/14  4:35 PM      Result Value Ref Range   Squamous Epithelial / LPF RARE  RARE   RBC / HPF 0-2  <3 RBC/hpf  POCT PREGNANCY, URINE     Status: None   Collection Time    01/15/14  4:40 PM      Result Value Ref Range   Preg Test, Ur NEGATIVE  NEGATIVE  CBC     Status: None   Collection Time    01/15/14  5:40 PM      Result Value Ref Range   WBC 8.2  4.0 - 10.5 K/uL   RBC 4.44  3.87 - 5.11 MIL/uL    Hemoglobin 13.0  12.0 - 15.0 g/dL   HCT 81.139.1  91.436.0 - 78.246.0 %   MCV 88.1  78.0 - 100.0 fL   MCH 29.3  26.0 - 34.0 pg   MCHC 33.2  30.0 - 36.0 g/dL   RDW 95.612.3  21.311.5 - 08.615.5 %   Platelets 278  150 - 400 K/uL  ABO/RH     Status: None   Collection Time    01/15/14  5:40 PM      Result Value Ref Range   ABO/RH(D) O POS    HCG, QUANTITATIVE, PREGNANCY     Status: Abnormal   Collection Time    01/15/14  5:40 PM      Result Value Ref Range   hCG, Beta Chain, Quant, S 10 (*) <5 mIU/mL  WET PREP, GENITAL     Status: Abnormal   Collection Time    01/15/14  6:14 PM      Result Value Ref Range   Yeast Wet Prep HPF POC NONE SEEN  NONE SEEN   Trich, Wet Prep NONE SEEN  NONE SEEN   Clue Cells Wet Prep HPF POC NONE SEEN  NONE SEEN   WBC, Wet Prep HPF POC FEW (*) NONE SEEN     Assessment and Plan  A:  Pregnancy at 5.1wks by LMP      Low Quant of 10      Moderate bleeding, probably inevitable SAB given HCG values  P:  Discussed findings       Will repeat one more Quant on Wednesday        Advised to keep appt for next week but to change it to a post SAB visit  [facility] HospitalWILLIAMS,Peggy Mckenzie 01/15/2014, 5:36 PM

## 2014-01-15 NOTE — MAU Note (Signed)
5 pos HPT's last week, [facility]  Noted blood with wiping @ 1330, sharp lower abd & pelvic pain.

## 2014-01-15 NOTE — Discharge Instructions (Signed)
Incomplete Miscarriage °A miscarriage is the sudden loss of an unborn baby (fetus) before the 20th week of pregnancy. In an incomplete miscarriage, parts of the fetus or placenta (afterbirth) remain in the body.  °Having a miscarriage can be an emotional experience. Talk with your health care provider about any questions you may have about miscarrying, the grieving process, and your future pregnancy plans. °CAUSES  °· Problems with the fetal chromosomes that make it impossible for the baby to develop normally. Problems with the baby's genes or chromosomes are most often the result of errors that occur by chance as the embryo divides and grows. The problems are not inherited from the parents. °· Infection of the cervix or uterus. °· Hormone problems. °· Problems with the cervix, such as having an incompetent cervix. This is when the tissue in the cervix is not strong enough to hold the pregnancy. °· Problems with the uterus, such as an abnormally shaped uterus, uterine fibroids, or congenital abnormalities. °· Certain medical conditions. °· Smoking, drinking alcohol, or taking illegal drugs. °· Trauma. °SYMPTOMS  °· Vaginal bleeding or spotting, with or without cramps or pain. °· Pain or cramping in the abdomen or lower back. °· Passing fluid, tissue, or blood clots from the vagina. °DIAGNOSIS  °Your health care provider will perform a physical exam. You may also have an ultrasound to confirm the miscarriage. Blood or urine tests may also be ordered. °TREATMENT  °· Usually, a dilation and curettage (D&C) procedure is performed. During a D&C procedure, the cervix is widened (dilated) and any remaining fetal or placental tissue is gently removed from the uterus. °· Antibiotic medicines are prescribed if there is an infection. Other medicines may be given to reduce the size of the uterus (contract) if there is a lot of bleeding. °· If you have Rh negative blood and your baby was Rh positive, you will need an Rho(D)  immune globulin shot. This shot will protect any future baby from having Rh blood problems in future pregnancies. °· You may be confined to bed rest. This means you should stay in bed and only get up to use the bathroom. °HOME CARE INSTRUCTIONS  °· Rest as directed by your health care provider. °· Restrict activity as directed by your health care provider. You may be allowed to continue light activity if curettage was not done but you require further treatment. °· Keep track of the number of pads you use each day. Keep track of how soaked (saturated) they are. Record this information. °· Do not  use tampons. °· Do not douche or have sexual intercourse until approved by your health care provider. °· Keep all follow-up appointments for re-evaluation and continuing management. °· Only take over-the-counter or prescription medicines for pain, fever, or discomfort as directed by your health care provider. °· Take antibiotic medicine as directed by your health care provider. Make sure you finish it even if you start to feel better. °SEEK IMMEDIATE MEDICAL CARE IF:  °· You experience severe cramps in your stomach, back, or abdomen. °· You have an unexplained temperature (make sure to record these temperatures). °· You pass large clots or tissue (save these for your health care provider to inspect). °· Your bleeding increases. °· You become light-headed, weak, or have fainting episodes. °MAKE SURE YOU:  °· Understand these instructions. °· Will watch your condition. °· Will get help right away if you are not doing well or get worse. °Document Released: 10/26/2005 Document Revised: 08/16/2013 Document Reviewed: 05/25/2013 °  Exit[employer]® Patient Information ©2014 [employer]. ° °

## 2014-01-16 LAB — GC/CHLAMYDIA PROBE AMP
CT Probe RNA: NEGATIVE
GC PROBE AMP APTIMA: NEGATIVE

## 2014-01-17 ENCOUNTER — Inpatient Hospital Stay (HOSPITAL_COMMUNITY)
Admission: AD | Admit: 2014-01-17 | Discharge: 2014-01-17 | Disposition: A | Discharge status: Home / Self Care | Payer: BC Managed Care – PPO | Source: Ambulatory Visit | Attending: Obstetrics & Gynecology | Admitting: Obstetrics & Gynecology

## 2014-01-17 ENCOUNTER — Encounter (HOSPITAL_COMMUNITY): Payer: Self-pay

## 2014-01-17 DIAGNOSIS — F319 Bipolar disorder, unspecified: Secondary | ICD-10-CM | POA: Insufficient documentation

## 2014-01-17 DIAGNOSIS — O039 Complete or unspecified spontaneous abortion without complication: Secondary | ICD-10-CM | POA: Insufficient documentation

## 2014-01-17 DIAGNOSIS — R109 Unspecified abdominal pain: Secondary | ICD-10-CM | POA: Insufficient documentation

## 2014-01-17 LAB — HCG, QUANTITATIVE, PREGNANCY: HCG, BETA CHAIN, QUANT, S: 3 m[IU]/mL (ref <5)

## 2014-01-17 NOTE — MAU Provider Note (Signed)
History     CSN: [id number]  Arrival date and time: 01/17/14 1707   None     Chief Complaint  Patient presents with  . [name]   HPI This is a 23 y.o. female who presents for followup Quant HCG.  She was seen 2 days ago with heavy bleeding and negative Urine pregnancy test.  Peggy ButtersQuant was 10.  Today her bleeding is lighter and she has some cramps in vagina and lower abdomen. Has appt at office next week.    Previous visit note (2 days ago):   A: Pregnancy at 5.1wks by LMP  Low Quant of 10  Moderate bleeding, probably inevitable SAB given HCG values  P: Discussed findings  Will repeat one more Quant on Wednesday  Advised to keep appt for next week but to change it to a post SAB visit   OB History   Grav Para Term Preterm Abortions TAB SAB Ect Mult Living   2 1 1       1       Past Medical History  Diagnosis Date  . Bipolar 1 disorder     Past Surgical History  Procedure Laterality Date  . Fracture surgery    . Cyst removal hand      No family history on file.  History  Substance Use Topics  . Smoking status: Never Smoker   . Smokeless tobacco: Never Used  . Alcohol Use: Yes     Comment: occasional    Allergies: No Known Allergies  Prescriptions prior to admission  Medication Sig Dispense Refill  . HYDROcodone-acetaminophen (NORCO/VICODIN) 5-325 MG per tablet Take 1 tablet by mouth every 6 (six) hours as needed.  20 tablet  0  . lamoTRIgine ([name]) 150 MG tablet Take 150 mg by mouth at bedtime.      . Prenatal Vit-Fe Fumarate-FA (PRENATAL MULTIVITAMIN) TABS tablet Take 1 tablet by mouth daily at 12 noon.      . promethazine (PHENERGAN) 25 MG tablet Take 1 tablet (25 mg total) by mouth every 6 (six) hours as needed for nausea or vomiting.  20 tablet  0  . traZODone (DESYREL) 50 MG tablet Take 25 mg by mouth at bedtime as needed for sleep.        Review of Systems  Constitutional: Negative for fever, chills and malaise/fatigue.  Gastrointestinal:  Positive for abdominal pain. Negative for nausea, vomiting, diarrhea and constipation.  Genitourinary:       Light to moderate bleeding  Neurological: Negative for dizziness and headaches.   Physical Exam   Blood pressure 119/79, pulse 70, resp. rate 16, last menstrual period 12/10/2013, SpO2 100.00%.  Physical Exam  Constitutional: She is oriented to person, place, and time. She appears well-developed and well-nourished. No distress.  Cardiovascular: Normal rate.   Respiratory: Effort normal.  GI: Soft. There is no tenderness.  Genitourinary:  Pelvic exam deferred  Musculoskeletal: Normal range of motion.  Neurological: She is alert and oriented to person, place, and time.  Skin: Skin is warm and dry.  Psychiatric: She has a normal mood and affect.    MAU Course  Procedures  MDM Results for orders placed during the hospital encounter of 01/17/14 (from the past 24 hour(s))  HCG, QUANTITATIVE, PREGNANCY     Status: None   Collection Time    01/17/14  5:31 PM      Result Value Ref Range   hCG, Beta Chain, Quant, S 3  <5 mIU/mL     Assessment and Plan  A:  Pregnancy, Spontaneous Abortion  P;  Discussed with Dr Mora Appl       Discharge home       Call office in AM to arrange follow up appt. May want to keep next week's appt and change it to followup        Bleeding precautions.    [facility] 01/17/2014, 6:31 PM

## 2014-01-17 NOTE — Discharge Instructions (Signed)
Miscarriage A miscarriage is the sudden loss of an unborn baby (fetus) before the 20th week of pregnancy. Most miscarriages happen in the first 3 months of pregnancy. Sometimes, it happens before a woman even knows she is pregnant. A miscarriage is also called a "spontaneous miscarriage" or "early pregnancy loss." Having a miscarriage can be an emotional experience. Talk with your caregiver about any questions you may have about miscarrying, the grieving process, and your future pregnancy plans. CAUSES   Problems with the fetal chromosomes that make it impossible for the baby to develop normally. Problems with the baby's genes or chromosomes are most often the result of errors that occur, by chance, as the embryo divides and grows. The problems are not inherited from the parents.  Infection of the cervix or uterus.   Hormone problems.   Problems with the cervix, such as having an incompetent cervix. This is when the tissue in the cervix is not strong enough to hold the pregnancy.   Problems with the uterus, such as an abnormally shaped uterus, uterine fibroids, or congenital abnormalities.   Certain medical conditions.   Smoking, drinking alcohol, or taking illegal drugs.   Trauma.  Often, the cause of a miscarriage is unknown.  SYMPTOMS   Vaginal bleeding or spotting, with or without cramps or pain.  Pain or cramping in the abdomen or lower back.  Passing fluid, tissue, or blood clots from the vagina. DIAGNOSIS  Your caregiver will perform a physical exam. You may also have an ultrasound to confirm the miscarriage. Blood or urine tests may also be ordered. TREATMENT   Sometimes, treatment is not necessary if you naturally pass all the fetal tissue that was in the uterus. If some of the fetus or placenta remains in the body (incomplete miscarriage), tissue left behind may become infected and must be removed. Usually, a dilation and curettage (D and C) procedure is performed.  During a D and C procedure, the cervix is widened (dilated) and any remaining fetal or placental tissue is gently removed from the uterus.  Antibiotic medicines are prescribed if there is an infection. Other medicines may be given to reduce the size of the uterus (contract) if there is a lot of bleeding.  If you have Rh negative blood and your baby was Rh positive, you will need a Rh immunoglobulin shot. This shot will protect any future baby from having Rh blood problems in future pregnancies. HOME CARE INSTRUCTIONS   Your caregiver may order bed rest or may allow you to continue light activity. Resume activity as directed by your caregiver.  Have someone help with home and family responsibilities during this time.   Keep track of the number of sanitary pads you use each day and how soaked (saturated) they are. Write down this information.   Do not use tampons. Do not douche or have sexual intercourse until approved by your caregiver.   Only take over-the-counter or prescription medicines for pain or discomfort as directed by your caregiver.   Do not take aspirin. Aspirin can cause bleeding.   Keep all follow-up appointments with your caregiver.   If you or your partner have problems with grieving, talk to your caregiver or seek counseling to help cope with the pregnancy loss. Allow enough time to grieve before trying to get pregnant again.  SEEK IMMEDIATE MEDICAL CARE IF:   You have severe cramps or pain in your back or abdomen.  You have a fever.  You pass large blood clots (walnut-sized   or larger) ortissue from your vagina. Save any tissue for your caregiver to inspect.   Your bleeding increases.   You have a thick, bad-smelling vaginal discharge.  You become lightheaded, weak, or you faint.   You have chills.  MAKE SURE YOU:  Understand these instructions.  Will watch your condition.  Will get help right away if you are not doing well or get  worse. Document Released: 04/21/2001 Document Revised: 02/20/2013 Document Reviewed: 12/15/2011 Exit[employer] Patient Information 2014 [employer].  

## 2014-01-17 NOTE — MAU Note (Signed)
Patient to MAU for follow up BHCG. States she had called the office and reported left lower abdominal pain and was told to come to MAU for a repeat BHCG and an ultrasound. Patient reports bleeding that is a little more than spotting. States Dr. Mora ApplPinn was going to call orders.

## 2014-01-25 ENCOUNTER — Other Ambulatory Visit: Payer: Self-pay | Admitting: Obstetrics and Gynecology

## 2014-01-25 NOTE — MAU Provider Note (Signed)
I agree with above  WSP

## 2014-02-21 ENCOUNTER — Other Ambulatory Visit: Payer: Self-pay | Admitting: Obstetrics and Gynecology

## 2014-04-04 LAB — OB RESULTS CONSOLE GC/CHLAMYDIA
CHLAMYDIA, DNA PROBE: NEGATIVE
Gonorrhea: NEGATIVE

## 2014-04-04 LAB — OB RESULTS CONSOLE ABO/RH: RH TYPE: POSITIVE

## 2014-04-04 LAB — OB RESULTS CONSOLE HEPATITIS B SURFACE ANTIGEN: Hepatitis B Surface Ag: NEGATIVE

## 2014-04-04 LAB — OB RESULTS CONSOLE RPR: RPR: NONREACTIVE

## 2014-04-04 LAB — OB RESULTS CONSOLE ANTIBODY SCREEN: Antibody Screen: NEGATIVE

## 2014-04-04 LAB — OB RESULTS CONSOLE RUBELLA ANTIBODY, IGM: Rubella: IMMUNE

## 2014-04-04 LAB — OB RESULTS CONSOLE HIV ANTIBODY (ROUTINE TESTING): HIV: NONREACTIVE

## 2014-04-24 ENCOUNTER — Inpatient Hospital Stay (HOSPITAL_COMMUNITY)
Admission: AD | Admit: 2014-04-24 | Discharge: 2014-04-24 | Disposition: A | Discharge status: Home / Self Care | Payer: BC Managed Care – PPO | Source: Ambulatory Visit | Attending: Obstetrics and Gynecology | Admitting: Obstetrics and Gynecology

## 2014-04-24 ENCOUNTER — Encounter (HOSPITAL_COMMUNITY): Payer: Self-pay

## 2014-04-24 DIAGNOSIS — O9989 Other specified diseases and conditions complicating pregnancy, childbirth and the puerperium: Principal | ICD-10-CM

## 2014-04-24 DIAGNOSIS — E860 Dehydration: Secondary | ICD-10-CM | POA: Insufficient documentation

## 2014-04-24 DIAGNOSIS — R209 Unspecified disturbances of skin sensation: Secondary | ICD-10-CM | POA: Insufficient documentation

## 2014-04-24 DIAGNOSIS — R1011 Right upper quadrant pain: Secondary | ICD-10-CM | POA: Insufficient documentation

## 2014-04-24 DIAGNOSIS — O99891 Other specified diseases and conditions complicating pregnancy: Secondary | ICD-10-CM | POA: Insufficient documentation

## 2014-04-24 LAB — URINALYSIS, ROUTINE W REFLEX MICROSCOPIC
Bilirubin Urine: NEGATIVE
Glucose, UA: NEGATIVE mg/dL
Ketones, ur: NEGATIVE mg/dL
Leukocytes, UA: NEGATIVE
NITRITE: NEGATIVE
Protein, ur: NEGATIVE mg/dL
SPECIFIC GRAVITY, URINE: 1.015 (ref 1.005–1.030)
UROBILINOGEN UA: 0.2 mg/dL (ref 0.0–1.0)
pH: 6 (ref 5.0–8.0)

## 2014-04-24 LAB — URINE MICROSCOPIC-ADD ON

## 2014-04-24 NOTE — MAU Provider Note (Signed)
History     CSN: [id number]  Arrival date and time: 04/24/14 [id number]   First Provider Initiated Contact with Patient 04/24/14 1925      Chief Complaint  Patient presents with  . Dizziness  . Numbness  . Fatigue   HPI [name] is a 23 y.o. G3P1011 at 2941w2d who presents to MAU today with multiple complaints. The patient states that she has had frequent dizzy spells recently and sometimes will see spots during these episodes. She also states that she has had intermittent numbness and tingling of the upper and lower extremities. She also endorses RUQ pain after eating occasionally. She has had some nausea without vomiting, diarrhea or constipation. She states normal PO intake recently. She also states that she occasionally has sharp shooting pains in her vagina. She denies vaginal bleeding, discharge or LOF. She also denies fever.   OB History   Grav Para Term Preterm Abortions TAB SAB Ect Mult Living   3 1 1  1  1   1       Past Medical History  Diagnosis Date  . Bipolar 1 disorder     Past Surgical History  Procedure Laterality Date  . Cyst removal hand    . Fracture surgery Left     leg    History reviewed. No pertinent family history.  History  Substance Use Topics  . Smoking status: Never Smoker   . Smokeless tobacco: Never Used  . Alcohol Use: No     Comment: occasional    Allergies: No Known Allergies  Prescriptions prior to admission  Medication Sig Dispense Refill  . acetaminophen (TYLENOL) 500 MG tablet Take 500 mg by mouth every 6 (six) hours as needed for headache.      . calcium carbonate (TUMS - DOSED IN MG ELEMENTAL CALCIUM) 500 MG chewable tablet Chew 2 tablets by mouth daily as needed for indigestion or heartburn.      . folic acid (FOLVITE) 800 MCG tablet Take 400 mcg by mouth at bedtime.       . Prenatal Vit-Fe Fumarate-FA (PRENATAL MULTIVITAMIN) TABS tablet Take 1 tablet by mouth at bedtime.         Review of Systems  Constitutional:  Negative for fever and malaise/fatigue.  Gastrointestinal: Positive for nausea and abdominal pain. Negative for vomiting, diarrhea and constipation.  Genitourinary: Negative for dysuria, urgency and frequency.       Neg - vaginal bleeding, discharge, LOF  Neurological: Positive for dizziness and tingling. Negative for loss of consciousness and weakness.   Physical Exam   Blood pressure 134/72, pulse 83, temperature 99.6 F (37.6 C), temperature source Oral, resp. rate 16, height 5\' 2"  (1.575 m), weight 182 lb 3.2 oz (82.645 kg), last menstrual period 12/10/2013, SpO2 100.00%.  Physical Exam  Constitutional: She is oriented to person, place, and time. She appears well-developed and well-nourished. No distress.  HENT:  Head: Normocephalic and atraumatic.  Cardiovascular: Normal rate, regular rhythm and normal heart sounds.   Respiratory: Effort normal and breath sounds normal. No respiratory distress.  GI: Soft. Bowel sounds are normal. She exhibits no distension and no mass. There is no tenderness. There is no rebound and no guarding.  Musculoskeletal: She exhibits no edema and no tenderness.  Neurological: She is alert and oriented to person, place, and time. She has normal strength.  Skin: Skin is warm and dry. No erythema.  Psychiatric: She has a normal mood and affect.   Results for orders placed during  the hospital encounter of 04/24/14 (from the past 24 hour(s))  URINALYSIS, ROUTINE W REFLEX MICROSCOPIC     Status: Abnormal   Collection Time    04/24/14  7:00 PM      Result Value Ref Range   Color, Urine YELLOW  YELLOW   APPearance CLEAR  CLEAR   Specific Gravity, Urine 1.015  1.005 - 1.030   pH 6.0  5.0 - 8.0   Glucose, UA NEGATIVE  NEGATIVE mg/dL   Hgb urine dipstick TRACE (*) NEGATIVE   Bilirubin Urine NEGATIVE  NEGATIVE   Ketones, ur NEGATIVE  NEGATIVE mg/dL   Protein, ur NEGATIVE  NEGATIVE mg/dL   Urobilinogen, UA 0.2  0.0 - 1.0 mg/dL   Nitrite NEGATIVE  NEGATIVE    Leukocytes, UA NEGATIVE  NEGATIVE  URINE MICROSCOPIC-ADD ON     Status: Abnormal   Collection Time    04/24/14  7:00 PM      Result Value Ref Range   Squamous Epithelial / LPF FEW (*) RARE   WBC, UA 0-2  <3 WBC/hpf   RBC / HPF 0-2  <3 RBC/hpf   Bacteria, UA RARE  RARE     MAU Course  Procedures None  MDM FHR - 160 bpm with doppler Discussed with Dr. Claiborne Billingsallahan. Advised increased hydration and limit of fatty foods in diet. Ok for discharge. [name] as scheduled.   Assessment and Plan  A: SIUP at 2542w2d Dehydration RUQ pain  P: Discharge home Patient advised to increased PO hydration as tolerated Patient advised to rest and elevate LE at night Patient advised to limit fatty foods in her diet in attempt to avoid RUQ pain Patient advised to follow-up in the office as scheduled or sooner if symptoms worsen Patient may return to MAU as needed or if her condition were to change or worsen   Freddi StarrJulie N Ethier, PA-C  04/24/2014, 8:10 PM

## 2014-04-24 NOTE — Discharge Instructions (Signed)
Dehydration, Adult  Dehydration means your body does not have as much fluid as it needs. Your kidneys, brain, and heart will not work properly without the right amount of fluids and salt.   HOME CARE   Ask your doctor how to replace body fluid losses (rehydrate).   Drink enough fluids to keep your pee (urine) clear or pale yellow.   Drink small amounts of fluids often if you feel sick to your stomach (nauseous) or throw up (vomit).   Eat like you normally do.   Avoid:   Foods or drinks high in sugar.   Bubbly (carbonated) drinks.   Juice.   Very hot or cold fluids.   Drinks with caffeine.   Fatty, greasy foods.   Alcohol.   Tobacco.   Eating too much.   Gelatin desserts.   Wash your hands to avoid spreading germs (bacteria, viruses).   Only take medicine as told by your doctor.   Keep all doctor visits as told.  GET HELP RIGHT AWAY IF:    You cannot drink something without throwing up.   You get worse even with treatment.   Your vomit has blood in it or looks greenish.   Your poop (stool) has blood in it or looks black and tarry.   You have not peed in 6 to 8 hours.   You pee a small amount of very dark pee.   You have a fever.   You pass out (faint).   You have belly (abdominal) pain that gets worse or stays in one spot (localizes).   You have a rash, stiff neck, or bad headache.   You get easily annoyed, sleepy, or are hard to wake up.   You feel weak, dizzy, or very thirsty.  MAKE SURE YOU:    Understand these instructions.   Will watch your condition.   Will get help right away if you are not doing well or get worse.  Document Released: 08/22/2009 Document Revised: 01/18/2012 Document Reviewed: 06/15/2011  [employer] Patient Information 2014 [employer].

## 2014-04-24 NOTE — MAU Note (Signed)
Patient states that over the past few days she will have periods of feeling dizzy and seeing black spots, numbness in feet, toes and fingers that comes and goes. Has upper right abdominal pain after eating. Sharp pain in the vagina that comes and goes. Denies bleeding or discharge. States she just finished antibiotics yesterday for a kidney infection.

## 2014-05-17 ENCOUNTER — Encounter (HOSPITAL_COMMUNITY): Payer: Self-pay | Admitting: *Deleted

## 2014-05-17 ENCOUNTER — Inpatient Hospital Stay (HOSPITAL_COMMUNITY)
Admission: AD | Admit: 2014-05-17 | Discharge: 2014-05-17 | Disposition: A | Discharge status: Home / Self Care | Payer: BC Managed Care – PPO | Source: Ambulatory Visit | Attending: Obstetrics and Gynecology | Admitting: Obstetrics and Gynecology

## 2014-05-17 DIAGNOSIS — N390 Urinary tract infection, site not specified: Secondary | ICD-10-CM

## 2014-05-17 DIAGNOSIS — O99891 Other specified diseases and conditions complicating pregnancy: Secondary | ICD-10-CM | POA: Insufficient documentation

## 2014-05-17 DIAGNOSIS — O9989 Other specified diseases and conditions complicating pregnancy, childbirth and the puerperium: Principal | ICD-10-CM

## 2014-05-17 DIAGNOSIS — R109 Unspecified abdominal pain: Secondary | ICD-10-CM | POA: Insufficient documentation

## 2014-05-17 DIAGNOSIS — O26899 Other specified pregnancy related conditions, unspecified trimester: Secondary | ICD-10-CM

## 2014-05-17 LAB — URINE MICROSCOPIC-ADD ON

## 2014-05-17 LAB — URINALYSIS, ROUTINE W REFLEX MICROSCOPIC
Bilirubin Urine: NEGATIVE
GLUCOSE, UA: NEGATIVE mg/dL
KETONES UR: NEGATIVE mg/dL
Nitrite: NEGATIVE
PROTEIN: NEGATIVE mg/dL
Specific Gravity, Urine: 1.01 (ref 1.005–1.030)
Urobilinogen, UA: 0.2 mg/dL (ref 0.0–1.0)
pH: 6 (ref 5.0–8.0)

## 2014-05-17 MED ORDER — CEPHALEXIN 500 MG PO CAPS: 500.0000 mg | ORAL_CAPSULE | Freq: Four times a day (QID) | ORAL | Status: DC

## 2014-05-17 MED ORDER — PHENAZOPYRIDINE HCL 100 MG PO TABS: 100.0000 mg | ORAL_TABLET | Freq: Three times a day (TID) | ORAL | Status: DC | PRN

## 2014-05-17 NOTE — Discharge Instructions (Signed)

## 2014-05-17 NOTE — MAU Note (Signed)
Some cramping last night.  Constant since 0630 this morning. No leaking.

## 2014-05-17 NOTE — MAU Provider Note (Signed)
History     CSN: [id number]  Arrival date and time: 05/17/14 1124   First Provider Initiated Contact with Patient 05/17/14 1151      Chief Complaint  Patient presents with  . Abdominal Cramping   HPI This is a 23 y.o. female at 4646w6d who presents with c/o cramping in lower abdomen. Denies leaking or bleeding. No problems with this pregnancy so far. No hx PTL  RN Note:  Some cramping last night. Constant since 0630 this morning. No leaking.       OB History   Grav Para Term Preterm Abortions TAB SAB Ect Mult Living   3 1 1  1  1   1       Past Medical History  Diagnosis Date  . Bipolar 1 disorder     Past Surgical History  Procedure Laterality Date  . Cyst removal hand    . Fracture surgery Left     leg    History reviewed. No pertinent family history.  History  Substance Use Topics  . Smoking status: Never Smoker   . Smokeless tobacco: Never Used  . Alcohol Use: No     Comment: occasional    Allergies: No Known Allergies  Prescriptions prior to admission  Medication Sig Dispense Refill  . acetaminophen (TYLENOL) 500 MG tablet Take 500 mg by mouth every 6 (six) hours as needed for headache.      . folic acid (FOLVITE) 800 MCG tablet Take 400 mcg by mouth at bedtime.       . Prenatal Vit-Fe Fumarate-FA (PRENATAL MULTIVITAMIN) TABS tablet Take 1 tablet by mouth at bedtime.         Review of Systems  Constitutional: Negative for fever, chills and malaise/fatigue.  Gastrointestinal: Positive for abdominal pain. Negative for nausea, vomiting, diarrhea and constipation.  Genitourinary: Negative for dysuria.  Musculoskeletal: Negative for myalgias.  Neurological: Negative for dizziness.   Physical Exam   Blood pressure 122/71, pulse 79, temperature 98.8 F (37.1 C), temperature source Oral, resp. rate 18, last menstrual period 12/10/2013.  Physical Exam  Constitutional: She is oriented to person, place, and time. She appears well-developed and  well-nourished. No distress.  Cardiovascular: Normal rate.   Respiratory: Effort normal.  GI: Soft. She exhibits no distension and no mass. There is no tenderness. There is no rebound and no guarding.  Genitourinary: Vagina normal and uterus normal. No vaginal discharge found.  Cervix long/closed   Musculoskeletal: Normal range of motion.  Neurological: She is alert and oriented to person, place, and time.  Skin: Skin is warm and dry.  Psychiatric: She has a normal mood and affect.    MAU Course  Procedures  MDM Results for orders placed during the hospital encounter of 05/17/14 (from the past 24 hour(s))  URINALYSIS, ROUTINE W REFLEX MICROSCOPIC     Status: Abnormal   Collection Time    05/17/14 11:31 AM      Result Value Ref Range   Color, Urine YELLOW  YELLOW   APPearance CLEAR  CLEAR   Specific Gravity, Urine 1.010  1.005 - 1.030   pH 6.0  5.0 - 8.0   Glucose, UA NEGATIVE  NEGATIVE mg/dL   Hgb urine dipstick LARGE (*) NEGATIVE   Bilirubin Urine NEGATIVE  NEGATIVE   Ketones, ur NEGATIVE  NEGATIVE mg/dL   Protein, ur NEGATIVE  NEGATIVE mg/dL   Urobilinogen, UA 0.2  0.0 - 1.0 mg/dL   Nitrite NEGATIVE  NEGATIVE   Leukocytes, UA TRACE (*) NEGATIVE  URINE MICROSCOPIC-ADD ON     Status: Abnormal   Collection Time    05/17/14 11:31 AM      Result Value Ref Range   Squamous Epithelial / LPF RARE  RARE   WBC, UA 3-6  <3 WBC/hpf   RBC / HPF 11-20  <3 RBC/hpf   Bacteria, UA FEW (*) RARE     Assessment and Plan  A:  SIUP at [redacted]w[redacted]d       Probable UTI      Cramping  P:  Discussed with Dr Dareen Piano       Urine to culture       Will treat presumptively for UTI and use Pyridium for pain       Call office tomorrow with progress report  [facility] 05/17/2014, 11:59 AM

## 2014-08-14 ENCOUNTER — Encounter (HOSPITAL_COMMUNITY): Payer: Self-pay | Admitting: *Deleted

## 2014-08-14 ENCOUNTER — Inpatient Hospital Stay (HOSPITAL_COMMUNITY)
Admission: AD | Admit: 2014-08-14 | Discharge: 2014-08-14 | Disposition: A | Discharge status: Home / Self Care | Payer: BC Managed Care – PPO | Source: Ambulatory Visit | Attending: Obstetrics and Gynecology | Admitting: Obstetrics and Gynecology

## 2014-08-14 DIAGNOSIS — Z3A30 30 weeks gestation of pregnancy: Secondary | ICD-10-CM | POA: Insufficient documentation

## 2014-08-14 DIAGNOSIS — M549 Dorsalgia, unspecified: Secondary | ICD-10-CM | POA: Diagnosis present

## 2014-08-14 DIAGNOSIS — O4703 False labor before 37 completed weeks of gestation, third trimester: Secondary | ICD-10-CM

## 2014-08-14 HISTORY — DX: Unspecified infectious disease: B99.9

## 2014-08-14 HISTORY — DX: Headache: R51

## 2014-08-14 HISTORY — DX: Headache, unspecified: R51.9

## 2014-08-14 LAB — URINALYSIS, ROUTINE W REFLEX MICROSCOPIC
Bilirubin Urine: NEGATIVE
Glucose, UA: NEGATIVE mg/dL
Ketones, ur: NEGATIVE mg/dL
Leukocytes, UA: NEGATIVE
NITRITE: NEGATIVE
PH: 7 (ref 5.0–8.0)
Protein, ur: NEGATIVE mg/dL
SPECIFIC GRAVITY, URINE: 1.01 (ref 1.005–1.030)
Urobilinogen, UA: 0.2 mg/dL (ref 0.0–1.0)

## 2014-08-14 LAB — URINE MICROSCOPIC-ADD ON

## 2014-08-14 MED ORDER — NIFEDIPINE 10 MG PO CAPS: 10.0000 mg | ORAL_CAPSULE | ORAL | Status: DC | PRN

## 2014-08-14 MED ORDER — NIFEDIPINE 10 MG PO CAPS
10.0000 mg | ORAL_CAPSULE | ORAL | Status: AC
  Administered 2014-08-14: 10 mg via ORAL
  Filled 2014-08-14: qty 1

## 2014-08-14 NOTE — MAU Provider Note (Signed)
Chief Complaint:  Abdominal Pain and Back Pain   First Provider Initiated Contact with Patient 08/14/14 0311      HPI: Peggy Mckenzie is a 23 y.o. G3P1011 at 3630w2dwho presents to maternity admissions reporting constant back pain and intermittent abdominal pain, described as like menstrual cramps, with onset 24 hours ago.  She reports good fetal movement, denies LOF, vaginal bleeding, vaginal itching/burning, urinary symptoms, h/a, dizziness, n/v, or fever/chills.     Past Medical History: Past Medical History  Diagnosis Date  . Bipolar 1 disorder   . Infection     frequent UTI's  . Headache     Past obstetric history: OB History  Gravida Para Term Preterm AB SAB TAB Ectopic Multiple Living  3 1 1  1 1    1     # Outcome Date GA Lbr Len/2nd Weight Sex Delivery Anes PTL Lv  3 CUR           2 SAB 01/2014          1 TRM 12/14/09    M SVD EPI  Y      Past Surgical History: Past Surgical History  Procedure Laterality Date  . Cyst removal hand    . Fracture surgery Left     leg    Family History: No family history on file.  Social History: History  Substance Use Topics  . Smoking status: Never Smoker   . Smokeless tobacco: Never Used  . Alcohol Use: No     Comment: occasional    Allergies:  Allergies  Allergen Reactions  . Betadine [Povidone Iodine] Itching    Meds:  Prescriptions prior to admission  Medication Sig Dispense Refill  . cephALEXin (KEFLEX) 500 MG capsule Take 1 capsule (500 mg total) by mouth 4 (four) times daily.  28 capsule  2  . phenazopyridine (PYRIDIUM) 100 MG tablet Take 1 tablet (100 mg total) by mouth 3 (three) times daily as needed for pain.  10 tablet  0  . acetaminophen (TYLENOL) 500 MG tablet Take 500 mg by mouth every 6 (six) hours as needed for headache.      . folic acid (FOLVITE) 800 MCG tablet Take 400 mcg by mouth at bedtime.       . Prenatal Vit-Fe Fumarate-FA (PRENATAL MULTIVITAMIN) TABS tablet Take 1 tablet by mouth at  bedtime.         ROS: Pertinent findings in history of present illness.  Physical Exam  Blood pressure 128/75, pulse 97, temperature 98.7 F (37.1 C), temperature source Oral, resp. rate 18, height 5\' 3"  (1.6 m), weight 91.173 kg (201 lb), last menstrual period 12/10/2013, SpO2 100.00%. GENERAL: Well-developed, well-nourished female in no acute distress.  HEENT: normocephalic HEART: normal rate RESP: normal effort ABDOMEN: Soft, non-tender, gravid appropriate for gestational age EXTREMITIES: Nontender, no edema NEURO: alert and oriented SPECULUM EXAM: NEFG, physiologic discharge, no blood, cervix clean Dilation: Closed Effacement (%): Thick Cervical Position: Posterior Exam by:: Sharen CounterLisa Leftwich-Kirby CNM  FHT:  Baseline 135 , moderate variability, accelerations present (15x15), no decelerations Contractions: 4/hour, mild to palpation   Labs: Results for orders placed during the hospital encounter of 08/14/14 (from the past 24 hour(s))  URINALYSIS, ROUTINE W REFLEX MICROSCOPIC     Status: Abnormal   Collection Time    08/14/14  1:56 AM      Result Value Ref Range   Color, Urine YELLOW  YELLOW   APPearance CLEAR  CLEAR   Specific Gravity, Urine 1.010  1.005 -  1.030   pH 7.0  5.0 - 8.0   Glucose, UA NEGATIVE  NEGATIVE mg/dL   Hgb urine dipstick SMALL (*) NEGATIVE   Bilirubin Urine NEGATIVE  NEGATIVE   Ketones, ur NEGATIVE  NEGATIVE mg/dL   Protein, ur NEGATIVE  NEGATIVE mg/dL   Urobilinogen, UA 0.2  0.0 - 1.0 mg/dL   Nitrite NEGATIVE  NEGATIVE   Leukocytes, UA NEGATIVE  NEGATIVE  URINE MICROSCOPIC-ADD ON     Status: Abnormal   Collection Time    08/14/14  1:56 AM      Result Value Ref Range   Squamous Epithelial / LPF FEW (*) RARE   WBC, UA 0-2  <3 WBC/hpf   RBC / HPF 3-6  <3 RBC/hpf   Bacteria, UA FEW (*) RARE   Urine-Other MUCOUS PRESENT      ED Course Procardia 10 mg PO given  Assessment: 1. Preterm contractions, third trimester     Plan: Consult Dr  Tenny Craw Procardia dose in MAU Discharge home PTL precautions and fetal kick counts Procardia 10 mg Q 4-6 hours PRN F/U in office this week Return to MAU as needed for emergencies     [name] Information   Schedule an appointment as soon as possible for a visit with Almon Hercules., MD. (this week)    Specialty:  Obstetrics and Gynecology   Contact information:   [address] [place] [address] [phone]       Follow up with [facility] [place] [facility]ERN[facility] ADMISS[facility]. (As needed for emergencies)    Contact information:   [address] [id number] [place] [address] [phone]       Medication List    STOP taking these medications       cephALEXin 500 MG capsule  Commonly known as:  KEFLEX     phenazopyridine 100 MG tablet  Commonly known as:  PYRIDIUM      TAKE these medications       acetaminophen 500 MG tablet  Commonly known as:  TYLENOL  Take 500 mg by mouth every 6 (six) hours as needed for headache.     folic acid 800 MCG tablet  Commonly known as:  FOLVITE  Take 400 mcg by mouth at bedtime.     NIFEdipine 10 MG capsule  Commonly known as:  PROCARDIA  Take 1 capsule (10 mg total) by mouth every 4 (four) hours as needed.     prenatal multivitamin Tabs tablet  Take 1 tablet by mouth at bedtime.        Sharen Counter Certified Nurse-Midwife 08/14/2014 4:58 AM

## 2014-08-14 NOTE — Progress Notes (Signed)
No s/s of adverse reaction to medication given. Pt to dc home.

## 2014-08-14 NOTE — MAU Note (Signed)
Pt reports constant lower back ache and lower abd cramping for the last 24 hours.

## 2014-08-14 NOTE — Discharge Instructions (Signed)

## 2014-09-02 ENCOUNTER — Emergency Department (HOSPITAL_COMMUNITY): Payer: BC Managed Care – PPO

## 2014-09-02 ENCOUNTER — Encounter (HOSPITAL_COMMUNITY): Payer: Self-pay | Admitting: Emergency Medicine

## 2014-09-02 ENCOUNTER — Emergency Department (HOSPITAL_COMMUNITY)
Admission: EM | Admit: 2014-09-02 | Discharge: 2014-09-02 | Disposition: A | Discharge status: Home / Self Care | Payer: BC Managed Care – PPO | Attending: Emergency Medicine | Admitting: Emergency Medicine

## 2014-09-02 DIAGNOSIS — Z79899 Other long term (current) drug therapy: Secondary | ICD-10-CM | POA: Diagnosis not present

## 2014-09-02 DIAGNOSIS — O9A219 Injury, poisoning and certain other consequences of external causes complicating pregnancy, unspecified trimester: Secondary | ICD-10-CM | POA: Diagnosis present

## 2014-09-02 DIAGNOSIS — Y929 Unspecified place or not applicable: Secondary | ICD-10-CM | POA: Insufficient documentation

## 2014-09-02 DIAGNOSIS — Y939 Activity, unspecified: Secondary | ICD-10-CM | POA: Diagnosis not present

## 2014-09-02 DIAGNOSIS — W0110XA Fall on same level from slipping, tripping and stumbling with subsequent striking against unspecified object, initial encounter: Secondary | ICD-10-CM | POA: Insufficient documentation

## 2014-09-02 DIAGNOSIS — W19XXXA Unspecified fall, initial encounter: Secondary | ICD-10-CM

## 2014-09-02 DIAGNOSIS — Z8659 Personal history of other mental and behavioral disorders: Secondary | ICD-10-CM | POA: Diagnosis not present

## 2014-09-02 DIAGNOSIS — S8002XA Contusion of left knee, initial encounter: Secondary | ICD-10-CM | POA: Diagnosis not present

## 2014-09-02 DIAGNOSIS — S8392XA Sprain of unspecified site of left knee, initial encounter: Secondary | ICD-10-CM | POA: Diagnosis not present

## 2014-09-02 DIAGNOSIS — Z3A30 30 weeks gestation of pregnancy: Secondary | ICD-10-CM | POA: Insufficient documentation

## 2014-09-02 DIAGNOSIS — T1490XA Injury, unspecified, initial encounter: Secondary | ICD-10-CM

## 2014-09-02 DIAGNOSIS — Z87440 Personal history of urinary (tract) infections: Secondary | ICD-10-CM | POA: Diagnosis not present

## 2014-09-02 NOTE — Discharge Instructions (Signed)
Knee Sprain °A knee sprain is a tear in one of the strong, fibrous tissues that connect the bones (ligaments) in your knee. The severity of the sprain depends on how much of the ligament is torn. The tear can be either partial or complete. °CAUSES  °Often, sprains are a result of a fall or injury. The force of the impact causes the fibers of your ligament to stretch too much. This excess tension causes the fibers of your ligament to tear. °SIGNS AND SYMPTOMS  °You may have some loss of motion in your knee. Other symptoms include: °· Bruising. °· Pain in the knee area. °· Tenderness of the knee to the touch. °· Swelling. °DIAGNOSIS  °To diagnose a knee sprain, your health care provider will physically examine your knee. Your health care provider may also suggest an X-ray exam of your knee to make sure no bones are broken. °TREATMENT  °If your ligament is only partially torn, treatment usually involves keeping the knee in a fixed position (immobilization) or bracing your knee for activities that require movement for several weeks. To do this, your health care provider will apply a bandage, cast, or splint to keep your knee from moving and to support your knee during movement until it heals. For a partially torn ligament, the healing process usually takes 4-6 weeks. °If your ligament is completely torn, depending on which ligament it is, you may need surgery to reconnect the ligament to the bone or reconstruct it. After surgery, a cast or splint may be applied and will need to stay on your knee for 4-6 weeks while your ligament heals. °HOME CARE INSTRUCTIONS °· Keep your injured knee elevated to decrease swelling. °· To ease pain and swelling, apply ice to the injured area: °¨ Put ice in a plastic bag. °¨ Place a towel between your skin and the bag. °¨ Leave the ice on for 20 minutes, 2-3 times a day. °· Only take medicine for pain as directed by your health care provider. °· Do not leave your knee unprotected until  pain and stiffness go away (usually 4-6 weeks). °· If you have a cast or splint, do not allow it to get wet. If you have been instructed not to remove it, cover it with a plastic bag when you shower or bathe. Do not swim. °· Your health care provider may suggest exercises for you to do during your recovery to prevent or limit permanent weakness and stiffness. °SEEK IMMEDIATE MEDICAL CARE IF: °· Your cast or splint becomes damaged. °· Your pain becomes worse. °· You have significant pain, swelling, or numbness below the cast or splint. °MAKE SURE YOU: °· Understand these instructions. °· Will watch your condition. °· Will get help right away if you are not doing well or get worse. °Document Released: 10/26/2005 Document Revised: 08/16/2013 Document Reviewed: 06/07/2013 °ExitCare® Patient Information ©2015 [employer]. This information is not intended to replace advice given to you by your health care provider. Make sure you discuss any questions you have with your health care provider. °Crutch Use °Crutches are used to take weight off one of your legs or feet when you stand or walk. It is important to use crutches that fit properly. When fitted properly: °· Each crutch should be 2-3 finger widths below the armpit. °· Your weight should be supported by your hand, and not by resting the armpit on the crutch. ° °RISKS AND COMPLICATIONS °Damage to the nerves that extend from your armpit to your hand   and arm. To prevent this from happening, make sure your crutches fit properly and do not put pressure on your armpit when using them. HOW TO USE YOUR CRUTCHES If you have been instructed to use partial weight bearing, apply (bear) the amount of weight as your health care provider suggests. Do not bear weight in an amount that causes pain to the area of injury. Walking 1. Step with the crutches. 2. Swing the healthy leg slightly ahead of the crutches. Going Up Steps If there is no handrail: 1. Step up with the  healthy leg. 2. Step up with the crutches and injured leg. 3. Continue in this way. If there is a handrail: 1. Hold both crutches in one hand. 2. Place your free hand on the handrail. 3. While putting your weight on your arms, lift your healthy leg to the step. 4. Bring the crutches and the injured leg up to that step. 5. Continue in this way. Going Down Steps Be very careful, as going down stairs with crutches is very challenging. If there is no handrail: 1. Step down with the injured leg and crutches. 2. Step down with the healthy leg. If there is a handrail: 1. Place your hand on the handrail. 2. Hold both crutches with your free hand. 3. Lower your injured leg and crutch to the step below you. Make sure to keep the crutch tips in the center of the step, never on the edge. 4. Lower your healthy leg to that step. 5. Continue in this way. Standing Up 1. Hold the injured leg forward. 2. Grab the armrest with one hand and the top of the crutches with the other hand. 3. Using these supports, pull yourself up to a standing position. Sitting Down 1. Hold the injured leg forward. 2. Grab the armrest with one hand and the top of the crutches with the other hand. 3. Lower yourself to a sitting position. SEEK MEDICAL CARE IF:  You still feel unsteady on your feet.  You develop new pain, for example in your armpits, back, shoulder, wrist, or hip.  You develop any numbness or tingling. SEEK IMMEDIATE MEDICAL CARE IF: You fall. Document Released: 10/23/2000 Document Revised: 10/31/2013 Document Reviewed: 07/03/2013 [facility] Patient Information 2015 [place], [place]. This information is not intended to replace advice given to you by your health care provider. Make sure you discuss any questions you have with your health care provider.

## 2014-09-02 NOTE — Progress Notes (Signed)
Spoke with Dr. Dareen PianoAnderson. U/S results= placenta posterior, NL fluid. No abruption. Category 1 fhr tracing. No uc's. Pt okay to be d/c home. Obstetrically cleared.

## 2014-09-02 NOTE — Progress Notes (Signed)
Pt is a G3P1 at 33 weeks . She says she fell today and hit her knee. Says that when she fell her thigh did come into contact with her abd. No c/o vag bleeding or leaking of fluid. Pt's left knee is swollen and has been x-rayed. Results are not back yet.

## 2014-09-02 NOTE — Progress Notes (Signed)
Spoke with Dr. Freida BusmanAllen. Pt is obstetrically cleared and can be d/c home.

## 2014-09-02 NOTE — ED Notes (Signed)
MD at bedside. 

## 2014-09-02 NOTE — ED Notes (Signed)
Ice pack and PO fluids given to patient by Alexia FreestonePatty, RN, per EDP

## 2014-09-02 NOTE — ED Provider Notes (Signed)
CSN: [id number]     Arrival date & time 09/02/14  1019 History   First MD Initiated Contact with Patient 09/02/14 1248     Chief Complaint  Patient presents with  . Knee Pain  . Fall     (Consider location/radiation/quality/duration/timing/severity/associated sxs/prior Treatment) HPI Comments: Patient here complaining of left knee pain after she tripped over a baby gate yesterday. States she did strike the lower portion of her abdomen but denies any abdominal pain at this time. She is currently [redacted] weeks pregnant and she is had no contractions or vaginal bleeding. She notes normal fetal movement. Did have some low back pain but denies any radicular symptoms. History of surgery on her left knee in the past. Pain characterized as sharp and worse with walking better with rest. Has used Tylenol with temporary relief.  Patient is a 23 y.o. female presenting with knee pain and fall. The history is provided by the patient.  Knee Pain Fall    Past Medical History  Diagnosis Date  . Bipolar 1 disorder   . Infection     frequent UTI's  . Headache    Past Surgical History  Procedure Laterality Date  . Cyst removal hand    . Fracture surgery Left     leg   History reviewed. No pertinent family history. History  Substance Use Topics  . Smoking status: Never Smoker   . Smokeless tobacco: Never Used  . Alcohol Use: No     Comment: occasional   OB History   Grav Para Term Preterm Abortions TAB SAB Ect Mult Living   3 1 1  1  1   1      Review of Systems  All other systems reviewed and are negative.     Allergies  Betadine  Home Medications   Prior to Admission medications   Medication Sig Start Date End Date Taking? Authorizing Provider  acetaminophen (TYLENOL) 500 MG tablet Take 500 mg by mouth every 6 (six) hours as needed for headache.   Yes Historical Provider, MD  NIFEdipine (PROCARDIA) 10 MG capsule Take 1 capsule (10 mg total) by mouth every 4 (four) hours as needed.  08/14/14  Yes Hurshel PartyLisa A Leftwich-Kirby, CNM  Prenatal Vit-Fe Fumarate-FA (PRENATAL MULTIVITAMIN) TABS tablet Take 1 tablet by mouth at bedtime.    Yes Historical Provider, MD  ranitidine (ZANTAC) 150 MG tablet Take 150 mg by mouth at bedtime.   Yes Historical Provider, MD   BP 112/94  Pulse 93  Temp(Src) 98 F (36.7 C) (Oral)  Resp 20  SpO2 100%  LMP 12/10/2013 Physical Exam  Vitals reviewed. Constitutional: She is oriented to person, place, and time. She appears well-developed and well-nourished.  Non-toxic appearance. No distress.  HENT:  Head: Normocephalic and atraumatic.  Eyes: Conjunctivae, EOM and lids are normal. Pupils are equal, round, and reactive to light.  Neck: Normal range of motion. Neck supple. No tracheal deviation present. No mass present.  Cardiovascular: Normal rate, regular rhythm and normal heart sounds.  Exam reveals no gallop.   No murmur heard. Pulmonary/Chest: Effort normal and breath sounds normal. No stridor. No respiratory distress. She has no decreased breath sounds. She has no wheezes. She has no rhonchi. She has no rales.  Abdominal: Soft. Normal appearance and bowel sounds are normal. She exhibits no distension. There is no tenderness. There is no rigidity, no rebound, no guarding and no CVA tenderness.  No visible bruising noted  Musculoskeletal: Normal range of motion. She exhibits no  edema.       Left knee: She exhibits ecchymosis. She exhibits no effusion and no deformity. Tenderness found. Medial joint line, lateral joint line and patellar tendon tenderness noted.       Legs: Neurological: She is alert and oriented to person, place, and time. She has normal strength. No cranial nerve deficit or sensory deficit. GCS eye subscore is 4. GCS verbal subscore is 5. GCS motor subscore is 6.  Skin: Skin is warm and dry. No abrasion and no rash noted.  Psychiatric: She has a normal mood and affect. Her speech is normal and behavior is normal.    ED Course    Procedures (including critical care time) Labs Review Labs Reviewed - No data to display  Imaging Review No results found.   EKG Interpretation None      MDM   Final diagnoses:  Fall    Patient was seen by the rapid response nurse and had OB ultrasound which showed no signs of abruption. This was discussed with the patient's OB doctor and there is no acute obstetrical emergency. X-ray was without fracture and patient will be given crutches and follow-up with her doctor    Toy BakerAnthony T Kolson Chovanec, MD 09/02/14 1517

## 2014-09-02 NOTE — ED Notes (Signed)
RR OB RN at bedside.  

## 2014-09-02 NOTE — ED Notes (Addendum)
Rapid response OB nurse called at [phone] and made aware of FHT and that patient reports a decrease in fetal movement/activity since she fell Per RR OB RN, recommend that patient have OB US Per RR OB RN, if US testing will take too long, [facility]  Will make EDP aware of above

## 2014-09-02 NOTE — ED Notes (Signed)
Patient transported to X-ray via stretcher Patient in NAD upon leaving for testing 

## 2014-09-02 NOTE — ED Notes (Signed)
Per EDP, RR OB RN needs to come monitor patient RR OB RN called and made aware Per RN, [facility]

## 2014-09-02 NOTE — ED Notes (Signed)
This RN spoke w/ Rapid Response L & D nurse.  Anderson MD does not think the PT needs to be monitored, but would like the Pt to have an OB ultrasound.  He can be reached at [phone] (cell).

## 2014-09-02 NOTE — ED Notes (Signed)
FHT 128 bpm Patient reports a decrease in fetal movement since she fell EDP made aware

## 2014-09-02 NOTE — ED Notes (Signed)
Pt c/o L knee and lower back pain after tripping and falling over a baby gait.  Pain score 7/10, increasing w/ ambulation.  Pt reports "my knee hit my belly as I fell, but I don't have any abdominal pain."

## 2014-09-10 ENCOUNTER — Encounter (HOSPITAL_COMMUNITY): Payer: Self-pay | Admitting: Emergency Medicine

## 2014-09-17 ENCOUNTER — Other Ambulatory Visit: Payer: Self-pay | Admitting: Obstetrics and Gynecology

## 2014-09-17 LAB — OB RESULTS CONSOLE GBS: GBS: NEGATIVE

## 2014-10-16 ENCOUNTER — Encounter (HOSPITAL_COMMUNITY): Payer: Self-pay | Admitting: *Deleted

## 2014-10-16 ENCOUNTER — Telehealth (HOSPITAL_COMMUNITY): Payer: Self-pay | Admitting: *Deleted

## 2014-10-16 NOTE — Telephone Encounter (Signed)
Preadmission screen  

## 2014-10-17 ENCOUNTER — Inpatient Hospital Stay (HOSPITAL_COMMUNITY)
Admission: RE | Admit: 2014-10-17 | Discharge: 2014-10-19 | DRG: 775 | Disposition: A | Discharge status: Home / Self Care | Payer: BC Managed Care – PPO | Source: Ambulatory Visit | Attending: Obstetrics and Gynecology | Admitting: Obstetrics and Gynecology

## 2014-10-17 ENCOUNTER — Encounter (HOSPITAL_COMMUNITY): Payer: Self-pay

## 2014-10-17 DIAGNOSIS — F319 Bipolar disorder, unspecified: Secondary | ICD-10-CM | POA: Diagnosis present

## 2014-10-17 DIAGNOSIS — Z3490 Encounter for supervision of normal pregnancy, unspecified, unspecified trimester: Secondary | ICD-10-CM

## 2014-10-17 DIAGNOSIS — Z3A39 39 weeks gestation of pregnancy: Secondary | ICD-10-CM | POA: Diagnosis present

## 2014-10-17 LAB — CBC
HCT: 34.6 % — ABNORMAL LOW (ref 36.0–46.0)
HEMOGLOBIN: 11.9 g/dL — AB (ref 12.0–15.0)
MCH: 30.3 pg (ref 26.0–34.0)
MCHC: 34.4 g/dL (ref 30.0–36.0)
MCV: 88 fL (ref 78.0–100.0)
Platelets: 261 10*3/uL (ref 150–400)
RBC: 3.93 MIL/uL (ref 3.87–5.11)
RDW: 14.6 % (ref 11.5–15.5)
WBC: 9.5 10*3/uL (ref 4.0–10.5)

## 2014-10-17 LAB — RPR

## 2014-10-17 LAB — TYPE AND SCREEN
ABO/RH(D): O POS
Antibody Screen: NEGATIVE

## 2014-10-17 MED ORDER — EPHEDRINE 5 MG/ML INJ
10.0000 mg | INTRAVENOUS | Status: DC | PRN
  Filled 2014-10-17: qty 2

## 2014-10-17 MED ORDER — ACETAMINOPHEN 325 MG PO TABS: 650.0000 mg | ORAL_TABLET | ORAL | Status: DC | PRN

## 2014-10-17 MED ORDER — OXYCODONE-ACETAMINOPHEN 5-325 MG PO TABS
1.0000 | ORAL_TABLET | ORAL | Status: DC | PRN
Start: 2014-10-17 — End: 2014-10-18

## 2014-10-17 MED ORDER — LACTATED RINGERS IV SOLN: 500.0000 mL | INTRAVENOUS | Status: DC | PRN

## 2014-10-17 MED ORDER — LACTATED RINGERS IV SOLN
500.0000 mL | Freq: Once | INTRAVENOUS | Status: AC
  Administered 2014-10-18: 500 mL via INTRAVENOUS

## 2014-10-17 MED ORDER — PHENYLEPHRINE 40 MCG/ML (10ML) SYRINGE FOR IV PUSH (FOR BLOOD PRESSURE SUPPORT)
80.0000 ug | PREFILLED_SYRINGE | INTRAVENOUS | Status: DC | PRN
  Filled 2014-10-17: qty 10
  Filled 2014-10-17: qty 2

## 2014-10-17 MED ORDER — OXYTOCIN 40 UNITS IN LACTATED RINGERS INFUSION - SIMPLE MED
1.0000 m[IU]/min | INTRAVENOUS | Status: DC
  Administered 2014-10-17: 2 m[IU]/min via INTRAVENOUS
  Filled 2014-10-17: qty 1000

## 2014-10-17 MED ORDER — OXYTOCIN 40 UNITS IN LACTATED RINGERS INFUSION - SIMPLE MED: 62.5000 mL/h | INTRAVENOUS | Status: DC

## 2014-10-17 MED ORDER — OXYCODONE-ACETAMINOPHEN 5-325 MG PO TABS: 2.0000 | ORAL_TABLET | ORAL | Status: DC | PRN

## 2014-10-17 MED ORDER — CITRIC ACID-SODIUM CITRATE 334-500 MG/5ML PO SOLN: 30.0000 mL | ORAL | Status: DC | PRN

## 2014-10-17 MED ORDER — LIDOCAINE HCL (PF) 1 % IJ SOLN
30.0000 mL | INTRAMUSCULAR | Status: DC | PRN
  Filled 2014-10-17: qty 30

## 2014-10-17 MED ORDER — BUTORPHANOL TARTRATE 1 MG/ML IJ SOLN
1.0000 mg | INTRAMUSCULAR | Status: AC
  Administered 2014-10-17: 1 mg via INTRAVENOUS

## 2014-10-17 MED ORDER — LACTATED RINGERS IV SOLN
INTRAVENOUS | Status: DC
  Administered 2014-10-17 (×2): 125 mL/h via INTRAVENOUS
  Administered 2014-10-18 (×2): via INTRAVENOUS

## 2014-10-17 MED ORDER — PHENYLEPHRINE 40 MCG/ML (10ML) SYRINGE FOR IV PUSH (FOR BLOOD PRESSURE SUPPORT)
80.0000 ug | PREFILLED_SYRINGE | INTRAVENOUS | Status: DC | PRN
  Filled 2014-10-17: qty 2

## 2014-10-17 MED ORDER — DIPHENHYDRAMINE HCL 50 MG/ML IJ SOLN: 12.5000 mg | INTRAMUSCULAR | Status: DC | PRN

## 2014-10-17 MED ORDER — ONDANSETRON HCL 4 MG/2ML IJ SOLN: 4.0000 mg | Freq: Four times a day (QID) | INTRAMUSCULAR | Status: DC | PRN

## 2014-10-17 MED ORDER — TERBUTALINE SULFATE 1 MG/ML IJ SOLN: 0.2500 mg | Freq: Once | INTRAMUSCULAR | Status: AC | PRN

## 2014-10-17 MED ORDER — BUTORPHANOL TARTRATE 1 MG/ML IJ SOLN
INTRAMUSCULAR | Status: AC
  Filled 2014-10-17: qty 1

## 2014-10-17 MED ORDER — OXYTOCIN BOLUS FROM INFUSION: 500.0000 mL | INTRAVENOUS | Status: DC

## 2014-10-17 MED ORDER — FENTANYL 2.5 MCG/ML BUPIVACAINE 1/10 % EPIDURAL INFUSION (WH - ANES)
14.0000 mL/h | INTRAMUSCULAR | Status: DC | PRN
  Administered 2014-10-18: 14 mL/h via EPIDURAL
  Filled 2014-10-17: qty 125

## 2014-10-17 NOTE — Plan of Care (Signed)
Problem: Consults Goal: Automotive engineer Patient Education (See Patient Education module for education specifics.)  Outcome: Completed/Met Date Met:  10/17/14 Goal: Birthing Suites Patient Information Press F2 to bring up selections list   Pt 37-[redacted] weeks EGA and Inpatient induction Goal: Skin Care Protocol Initiated - if Braden Score 18 or less If consults are not indicated, leave blank or document N/A  Outcome: Not Applicable Date Met:  86/16/83 Goal: Chaplain Consult Outcome: Not Applicable Date Met:  72/90/21 Goal: Case Manager Consult Outcome: Not Applicable Date Met:  11/55/20 Goal: Lactation Consult Initiated if indicated Outcome: Not Applicable Date Met:  80/22/33 Goal: Home Health Consult Outcome: Not Applicable Date Met:  61/22/44 Goal: Neonatologist Consult Outcome: Not Applicable Date Met:  97/53/00 Goal: Nutrition Consult-if indicated Outcome: Not Applicable Date Met:  51/10/21 Goal: Perinatal education as indicated Outcome: Not Applicable Date Met:  11/73/56 Goal: Physical Therapy Consult Outcome: Not Applicable Date Met:  70/14/10 Goal: Social Worker Consult (Birthing Suites) Outcome: Not Applicable Date Met:  30/13/14 Goal: NICU Tour Outcome: Not Applicable Date Met:  38/88/75 Goal: Diabetes Guidelines per MD order/protocol Outcome: Not Applicable Date Met:  79/72/82 Goal: Prenatal labs/testing reviewed upon admission Outcome: Completed/Met Date Met:  10/17/14 Goal: Orientation to unit: Plan of Care Outcome: Completed/Met Date Met:  10/17/14 Goal: Orientation to unit: Room Outcome: Completed/Met Date Met:  10/17/14 Goal: Orientation to unit: [place] (smoking, visitation, chaplain services, helpline)  Outcome: Completed/Met Date Met:  10/17/14 Goal: Orientation to unit: Other (Specify with a note) Outcome: Not Applicable Date Met:  04/10/55  Problem: Phase I Progression Outcomes Goal:  Assess per MD/Nurse,Routine-VS,FHR,UC,Head to Toe assess Outcome: Completed/Met Date Met:  10/17/14 Goal: Obtain and review prenatal records Outcome: Completed/Met Date Met:  10/17/14 Goal: Tolerating diet Outcome: Completed/Met Date Met:  10/17/14 Goal: Medications/IV Fluids N/A Outcome: Not Applicable Date Met:  15/37/94 Goal: Induction meds as ordered Outcome: Completed/Met Date Met:  10/17/14 Goal: Pitocin as ordered Outcome: Completed/Met Date Met:  10/17/14

## 2014-10-17 NOTE — Progress Notes (Signed)
Mildly uncomfortable w contractions  SVE: unchanged 3/50/posterior EFM: Cat 1 Toco: q683min  G3P1011 @ 7363w3d w EIOL No cervical change despite 10h pitocin, now at 20mU. Discussed options, including resting pitocin and restarting, AROM, or potentially d/c home given elective nature of induction and intact membranes.   Will rest off pit x 2hr, then restart.  FSR

## 2014-10-17 NOTE — H&P (Signed)
23 y.o. [id number] @ 5995w3d presents for elective IOL at term.  Dated by 1st trimester US.  Occ ctx.  Otherwise has good fetal movement. Scant dark brown vaginal bleeding this AM.  Past Medical History  Diagnosis Date  . Bipolar 1 disorder   . Infection     frequent UTI's  . Headache     Past Surgical History  Procedure Laterality Date  . Cyst removal hand    . Fracture surgery Left     leg    OB History  Gravida Para Term Preterm AB SAB TAB Ectopic Multiple Living  3 1 1  1 1    1     # Outcome Date GA Lbr Len/2nd Weight Sex Delivery Anes PTL Lv  3 Current           2 SAB 01/2014          1 Term 12/14/09    M Vag-Spont EPI  Y      History   Social History  . Marital Status: Married    Spouse Name: N/A    Number of Children: N/A  . Years of Education: N/A   Occupational History  . Not on file.   Social History Main Topics  . Smoking status: Never Smoker   . Smokeless tobacco: Never Used  . Alcohol Use: No     Comment: occasional  . Drug Use: No  . Sexual Activity: Yes    Birth Control/ Protection: None   Other Topics Concern  . Not on file   Social History Narrative   Betadine    Prenatal Transfer Tool  Maternal Diabetes: No Genetic Screening: Normal Maternal Ultrasounds/Referrals: Normal Fetal Ultrasounds or other Referrals:  None Maternal Substance Abuse:  No Significant Maternal Medications:  None Significant Maternal Lab Results: Lab values include: Group B Strep negative  ABO, Rh: O/Positive/-- (05/27 0000) Antibody: Negative (05/27 0000) Rubella:  Immune RPR: Nonreactive (05/27 0000)  HBsAg: Negative (05/27 0000)  HIV: Non-reactive (05/27 0000)  GBS: Negative (11/09 0000)    Other PNC: uncomplicated.    Filed Vitals:   10/17/14 0839  BP: 123/69  Pulse: 81  Temp:   Resp:      General:  NAD Lungs: CTAB Cardiac: RRR Abdomen:  soft, gravid, EFW 7.5-8# Ex:  1+ edema SVE:  3/50/-1/soft/posterior, vtx by sutures FHTs:  150s, mod var, +  accels, Cat 1 Toco:  q5-7 min   A/P   23 y.o. 2095w3d  G3P1011 presents for elective IOL Cervix favorable, will start pitocin Epidural upon maternal request H/o BPAD type 1: on lamictal at start of pregnancy.  Plan short interval f/u w psych after delivery given risk of PPD. FSR/ vtx/ GBS neg  Peggy Mckenzie, [facility]ANNA

## 2014-10-18 ENCOUNTER — Inpatient Hospital Stay (HOSPITAL_COMMUNITY): Payer: BC Managed Care – PPO | Admitting: Anesthesiology

## 2014-10-18 ENCOUNTER — Encounter (HOSPITAL_COMMUNITY): Payer: Self-pay

## 2014-10-18 MED ORDER — SODIUM CHLORIDE 0.9 % IV SOLN: 250.0000 mL | INTRAVENOUS | Status: DC | PRN

## 2014-10-18 MED ORDER — ZOLPIDEM TARTRATE 5 MG PO TABS: 5.0000 mg | ORAL_TABLET | Freq: Every evening | ORAL | Status: DC | PRN

## 2014-10-18 MED ORDER — METHYLERGONOVINE MALEATE 0.2 MG/ML IJ SOLN: 0.2000 mg | INTRAMUSCULAR | Status: DC | PRN

## 2014-10-18 MED ORDER — DIBUCAINE 1 % RE OINT: 1.0000 "application " | TOPICAL_OINTMENT | RECTAL | Status: DC | PRN

## 2014-10-18 MED ORDER — FERROUS SULFATE 325 (65 FE) MG PO TABS
325.0000 mg | ORAL_TABLET | Freq: Two times a day (BID) | ORAL | Status: DC
  Administered 2014-10-19: 325 mg via ORAL
  Filled 2014-10-18: qty 1

## 2014-10-18 MED ORDER — TETANUS-DIPHTH-ACELL PERTUSSIS 5-2.5-18.5 LF-MCG/0.5 IM SUSP: 0.5000 mL | Freq: Once | INTRAMUSCULAR | Status: DC

## 2014-10-18 MED ORDER — SENNOSIDES-DOCUSATE SODIUM 8.6-50 MG PO TABS
2.0000 | ORAL_TABLET | ORAL | Status: DC
  Administered 2014-10-18: 2 via ORAL
  Filled 2014-10-18: qty 2

## 2014-10-18 MED ORDER — ONDANSETRON HCL 4 MG/2ML IJ SOLN: 4.0000 mg | INTRAMUSCULAR | Status: DC | PRN

## 2014-10-18 MED ORDER — SODIUM CHLORIDE 0.9 % IJ SOLN: 3.0000 mL | INTRAMUSCULAR | Status: DC | PRN

## 2014-10-18 MED ORDER — MAGNESIUM HYDROXIDE 400 MG/5ML PO SUSP: 30.0000 mL | ORAL | Status: DC | PRN

## 2014-10-18 MED ORDER — OXYCODONE-ACETAMINOPHEN 5-325 MG PO TABS
1.0000 | ORAL_TABLET | ORAL | Status: DC | PRN
  Administered 2014-10-18: 1 via ORAL
  Filled 2014-10-18: qty 1

## 2014-10-18 MED ORDER — SIMETHICONE 80 MG PO CHEW: 80.0000 mg | CHEWABLE_TABLET | ORAL | Status: DC | PRN

## 2014-10-18 MED ORDER — BUTORPHANOL TARTRATE 1 MG/ML IJ SOLN
1.0000 mg | INTRAMUSCULAR | Status: DC | PRN
  Administered 2014-10-18: 1 mg via INTRAVENOUS
  Filled 2014-10-18: qty 1

## 2014-10-18 MED ORDER — LANOLIN HYDROUS EX OINT: TOPICAL_OINTMENT | CUTANEOUS | Status: DC | PRN

## 2014-10-18 MED ORDER — MEASLES, MUMPS & RUBELLA VAC ~~LOC~~ INJ: 0.5000 mL | INJECTION | Freq: Once | SUBCUTANEOUS | Status: DC

## 2014-10-18 MED ORDER — ONDANSETRON HCL 4 MG PO TABS: 4.0000 mg | ORAL_TABLET | ORAL | Status: DC | PRN

## 2014-10-18 MED ORDER — IBUPROFEN 800 MG PO TABS
800.0000 mg | ORAL_TABLET | Freq: Three times a day (TID) | ORAL | Status: DC
  Administered 2014-10-18 – 2014-10-19 (×2): 800 mg via ORAL
  Filled 2014-10-18 (×2): qty 1

## 2014-10-18 MED ORDER — WITCH HAZEL-GLYCERIN EX PADS: 1.0000 "application " | MEDICATED_PAD | CUTANEOUS | Status: DC | PRN

## 2014-10-18 MED ORDER — METHYLERGONOVINE MALEATE 0.2 MG PO TABS: 0.2000 mg | ORAL_TABLET | ORAL | Status: DC | PRN

## 2014-10-18 MED ORDER — SODIUM CHLORIDE 0.9 % IJ SOLN: 3.0000 mL | Freq: Two times a day (BID) | INTRAMUSCULAR | Status: DC

## 2014-10-18 MED ORDER — BENZOCAINE-MENTHOL 20-0.5 % EX AERO
1.0000 "application " | INHALATION_SPRAY | CUTANEOUS | Status: DC | PRN
  Administered 2014-10-19: 1 via TOPICAL
  Filled 2014-10-18: qty 56

## 2014-10-18 MED ORDER — OXYCODONE-ACETAMINOPHEN 5-325 MG PO TABS: 2.0000 | ORAL_TABLET | ORAL | Status: DC | PRN

## 2014-10-18 MED ORDER — LIDOCAINE HCL (PF) 1 % IJ SOLN
INTRAMUSCULAR | Status: DC | PRN
  Administered 2014-10-18 (×2): 9 mL

## 2014-10-18 MED ORDER — DIPHENHYDRAMINE HCL 25 MG PO CAPS: 25.0000 mg | ORAL_CAPSULE | Freq: Four times a day (QID) | ORAL | Status: DC | PRN

## 2014-10-18 MED ORDER — FENTANYL 2.5 MCG/ML BUPIVACAINE 1/10 % EPIDURAL INFUSION (WH - ANES)
INTRAMUSCULAR | Status: DC | PRN
  Administered 2014-10-18: 14 mL/h via EPIDURAL

## 2014-10-18 MED ORDER — PRENATAL MULTIVITAMIN CH
1.0000 | ORAL_TABLET | Freq: Every day | ORAL | Status: DC
Start: 2014-10-19 — End: 2014-10-19

## 2014-10-18 NOTE — Plan of Care (Signed)
Problem: Phase II Progression Outcomes Goal: Activity as indicated Outcome: Completed/Met Date Met:  10/18/14

## 2014-10-18 NOTE — Anesthesia Procedure Notes (Signed)
Epidural Patient location during procedure: OB Start time: 10/18/2014 7:07 AM End time: 10/18/2014 7:11 AM  Staffing Anesthesiologist: Leilani AbleHATCHETT, Milia Warth Performed by: anesthesiologist   Preanesthetic Checklist Completed: patient identified, surgical consent, pre-op evaluation, timeout performed, IV checked, risks and benefits discussed and monitors and equipment checked  Epidural Patient position: sitting Prep: site prepped and draped and DuraPrep Patient monitoring: continuous pulse ox and blood pressure Approach: midline Location: L3-L4 Injection technique: LOR air  Needle:  Needle type: Tuohy  Needle gauge: 17 G Needle length: 9 cm and 9 Needle insertion depth: 7 cm Catheter type: closed end flexible Catheter size: 19 Gauge Catheter at skin depth: 12 cm Test dose: negative and Other  Assessment Sensory level: T9 Events: blood not aspirated, injection not painful, no injection resistance, negative IV test and no paresthesia  Additional Notes Reason for block:procedure for pain

## 2014-10-18 NOTE — Progress Notes (Signed)
Attempt to AROM by Dr Chestine Sporelark, unable d/t pt discomfort. Plan to place epidural then AROM when comfortable. Bolus started

## 2014-10-18 NOTE — Plan of Care (Signed)
Problem: Phase II Progression Outcomes Goal: Clear liquid diet Outcome: Completed/Met Date Met:  10/18/14

## 2014-10-18 NOTE — Plan of Care (Signed)
Problem: Phase II Progression Outcomes Goal: Fetal monitoring per orders Outcome: Completed/Met Date Met:  10/18/14

## 2014-10-18 NOTE — Plan of Care (Signed)
Problem: Phase I Progression Outcomes Goal: Other Phase I Outcomes/Goals Outcome: Not Applicable Date Met:  10/18/14     

## 2014-10-18 NOTE — Plan of Care (Signed)
Problem: Phase I Progression Outcomes Goal: IV Pain medications as ordered Outcome: Completed/Met Date Met:  10/18/14

## 2014-10-18 NOTE — Anesthesia Preprocedure Evaluation (Signed)
Anesthesia Evaluation  Patient identified by MRN, date of birth, ID band Patient awake    Reviewed: Allergy & Precautions, H&P , NPO status , Patient's Chart, lab work & pertinent test results  Airway Mallampati: II  TM Distance: >3 FB Neck ROM: full    Dental no notable dental hx.    Pulmonary neg pulmonary ROS,    Pulmonary exam normal       Cardiovascular negative cardio ROS      Neuro/Psych    GI/Hepatic negative GI ROS, Neg liver ROS,   Endo/Other  Morbid obesity  Renal/GU negative Renal ROS     Musculoskeletal   Abdominal (+) + obese,   Peds  Hematology negative hematology ROS (+)   Anesthesia Other Findings   Reproductive/Obstetrics (+) Pregnancy                             Anesthesia Physical Anesthesia Plan  ASA: III  Anesthesia Plan: Epidural   Post-op Pain Management:    Induction:   Airway Management Planned:   Additional Equipment:   Intra-op Plan:   Post-operative Plan:   Informed Consent: I have reviewed the patients History and Physical, chart, labs and discussed the procedure including the risks, benefits and alternatives for the proposed anesthesia with the patient or authorized representative who has indicated his/her understanding and acceptance.     Plan Discussed with:   Anesthesia Plan Comments:         Anesthesia Quick Evaluation  

## 2014-10-18 NOTE — Plan of Care (Signed)
Problem: Phase I Progression Outcomes Goal: Medical plan of care initiated within 2 hrs of admission Outcome: Completed/Met Date Met:  10/18/14

## 2014-10-18 NOTE — Plan of Care (Signed)
Problem: Phase I Progression Outcomes Goal: Voiding adequately Outcome: Completed/Met Date Met:  10/18/14 Goal: Foley catheter patent Outcome: Not Applicable Date Met:  34/91/79 Goal: OOB as tolerated unless otherwise ordered Outcome: Completed/Met Date Met:  10/18/14 Goal: IS, TCDB as ordered Outcome: Not Applicable Date Met:  15/05/69 Goal: VS, stable, temp < 100.4 degrees F Outcome: Completed/Met Date Met:  10/18/14 Goal: Initial discharge plan identified Outcome: Completed/Met Date Met:  10/18/14 Goal: Other Phase I Outcomes/Goals Outcome: Completed/Met Date Met:  10/18/14  Problem: Phase II Progression Outcomes Goal: Incision intact & without signs/symptoms of infection Outcome: Not Applicable Date Met:  79/48/01 Goal: Rh isoimmunization per orders Outcome: Not Applicable Date Met:  65/53/74 Goal: Tolerating diet Outcome: Completed/Met Date Met:  10/18/14  Problem: Discharge Progression Outcomes Goal: Remove staples per MD order Outcome: Not Applicable Date Met:  82/70/78 Goal: MMR given as ordered Outcome: Not Applicable Date Met:  67/54/49

## 2014-10-18 NOTE — Plan of Care (Signed)
Problem: Phase II Progression Outcomes Goal: Regular diet Outcome: Not Applicable Date Met:  73/71/06

## 2014-10-18 NOTE — Plan of Care (Signed)
Problem: Phase II Progression Outcomes Goal: Empty bladder prn Outcome: Completed/Met Date Met:  10/18/14

## 2014-10-18 NOTE — Plan of Care (Signed)
Problem: Phase I Progression Outcomes Goal: OOB as tolerated unless otherwise ordered Outcome: Completed/Met Date Met:  10/18/14     

## 2014-10-18 NOTE — Plan of Care (Signed)
Problem: Phase I Progression Outcomes Goal: Discharge home if all goals are met Outcome: Not Applicable Date Met:  16/42/90

## 2014-10-18 NOTE — Plan of Care (Signed)
Problem: Phase II Progression Outcomes Goal: Other Phase II Outcomes/Goals Outcome: Not Applicable Date Met:  10/18/14     

## 2014-10-18 NOTE — Progress Notes (Signed)
Pt just now beginning to get uncomfortable w contractions  Discomfort w cervical exam has increased significantly overnight and she is unable to tolerate SVE EFM: 130s, mod var, + accels, Cat 1 Toco: q2-3 min  Long discussion again with [place] regarding options.  Her membranes are still in tact.  She has been on pitocin for >20h with suspected minimal cervical change (unable to assess sve accurately due to patient discomfort).  Discussed epidural for pain controlled followed by AROM or discharge home to await spontaneously labor.  She is aware that ROM may not be successful at inducing labor, at which  Point our only other option would be cesarean delivery.  She is not interested in stopping the induction.  She elects to obtain epidural followed by AROM.

## 2014-10-18 NOTE — Progress Notes (Signed)
Pt comfortable with epidural.  FHTS 120s gSTV, NST R Toc q3 SVE 4-5/80/-1, AROM clear

## 2014-10-19 LAB — CBC
HCT: 32.4 % — ABNORMAL LOW (ref 36.0–46.0)
HEMOGLOBIN: 10.6 g/dL — AB (ref 12.0–15.0)
MCH: 29 pg (ref 26.0–34.0)
MCHC: 32.7 g/dL (ref 30.0–36.0)
MCV: 88.8 fL (ref 78.0–100.0)
Platelets: 258 10*3/uL (ref 150–400)
RBC: 3.65 MIL/uL — ABNORMAL LOW (ref 3.87–5.11)
RDW: 14.9 % (ref 11.5–15.5)
WBC: 12.1 10*3/uL — ABNORMAL HIGH (ref 4.0–10.5)

## 2014-10-19 LAB — CCBB MATERNAL DONOR DRAW

## 2014-10-19 NOTE — Progress Notes (Signed)
Patient is eating, ambulating, voiding.  Pain control is good.  Filed Vitals:   10/18/14 1811 10/18/14 1953 10/19/14 0320 10/19/14 0515  BP: 141/75 141/64 130/64 135/74  Pulse: 98 94 82 81  Temp: 98.4 F (36.9 C) 99.4 F (37.4 C) 98.2 F (36.8 C) 98 F (36.7 C)  TempSrc: Oral Oral Oral Oral  Resp:  18 20 20   Height:      Weight:      SpO2: 98% 99% 98%     Fundus firm Perineum without swelling.  Lab Results  Component Value Date   WBC 12.1* 10/19/2014   HGB 10.6* 10/19/2014   HCT 32.4* 10/19/2014   MCV 88.8 10/19/2014   PLT 258 10/19/2014    --/--/O POS (12/09 0745)/RI  A/P Post partum day 1.  Routine care.  Expect d/c routine.    Dakari Stabler A

## 2014-10-19 NOTE — Discharge Summary (Signed)
Obstetric Discharge Summary Reason for Admission: induction of labor Prenatal Procedures: none Intrapartum Procedures: spontaneous vaginal delivery Postpartum Procedures: none Complications-Operative and Postpartum: none HEMOGLOBIN  Date Value Ref Range Status  10/19/2014 10.6* 12.0 - 15.0 g/dL Final   HCT  Date Value Ref Range Status  10/19/2014 32.4* 36.0 - 46.0 % Final    Discharge Diagnoses: Term Pregnancy-delivered  Discharge Information: Date: 10/19/2014 Activity: pelvic rest Diet: routine Medications: Ibuprofen Condition: stable Instructions: refer to practice specific booklet Discharge to: home [name] Information    Follow up with Isha Seefeld A, MD In 4 weeks.   Specialty:  Obstetrics and Gynecology   Contact information:   [address] [place] [address] [phone]       Newborn Data: Live born female  Birth Weight: 7 lb 13.9 oz (3570 g) APGAR: 8, 9  Home with mother.  Peggy Mckenzie A 10/19/2014, 7:49 AM

## 2014-10-19 NOTE — Anesthesia Postprocedure Evaluation (Signed)
Anesthesia Post Note  Patient: Peggy EdmanStephanie R Castrillo  Procedure(s) Performed: * No procedures listed *  Anesthesia type: Epidural  Patient location: Mother/Baby  Post pain: Pain level controlled  Post assessment: Post-op Vital signs reviewed  Last Vitals:  Filed Vitals:   10/19/14 0826  BP: 122/69  Pulse: 79  Temp: 36.8 C  Resp: 19    Post vital signs: Reviewed  Level of consciousness:alert  Complications: No apparent anesthesia complications

## 2014-10-22 NOTE — Addendum Note (Signed)
Addendum  created 10/22/14 1733 by Dana AllanAmy Guthrie Lemme, MD   Modules edited: Notes Section   Notes Section:  File: [id number]

## 2014-10-22 NOTE — Anesthesia Post-op Follow-up Note (Signed)
Called patient after referred for c/o headache.  Patient had epidural around 0700 on 10/18/14.  Had SVD at 1232 on 10/18/14.  Pt noted onset of headache around 1400 on 10/18/14 and has had headache ever since.  HA has not responded to tylenol or ibuprofen.  Improves with lying down.  Is not interrupting her sleep.  She endorses photophobia and upper neck stiffness, but denies visual disturbance, phonophobia, N/V.  Has not improved or worsened over the past four days.  Saw Dr Claiborne Billingsallahan at the office today - BP was 130s/80s.  Some of patient's history is consistent with PDPH (positional HA, neck stiffness, photophobia), but timing of onset and lack of documented dural puncture are not.  Discussed natural history of PDPH with patient.  Discussed management options.  At this time, patient elects to try conservative management with Fioricet (per Dr Claiborne Billingsallahan whom I spoke with directly), increased PO fluid intake, increased bedrest, and increased PO caffeine intake.  We discussed that she should contact us or come to MAU if she continues to have the headache after 7 days, or if headache is unbearable and she wants to try other treatment options (ACTH, or EBP).  Patient expressed understanding and gratitude for plan.  Jasmine DecemberA. Daimion Adamcik, MD

## 2015-08-29 ENCOUNTER — Other Ambulatory Visit: Payer: Self-pay | Admitting: Obstetrics and Gynecology

## 2015-09-17 ENCOUNTER — Ambulatory Visit (INDEPENDENT_AMBULATORY_CARE_PROVIDER_SITE_OTHER): Payer: BLUE CROSS/BLUE SHIELD | Admitting: Physician Assistant

## 2015-09-17 VITALS — BP 132/80 | HR 64 | Temp 98.2°F | Resp 18 | Ht 62.0 in | Wt 168.8 lb

## 2015-09-17 DIAGNOSIS — Z202 Contact with and (suspected) exposure to infections with a predominantly sexual mode of transmission: Secondary | ICD-10-CM | POA: Diagnosis not present

## 2015-09-17 DIAGNOSIS — N898 Other specified noninflammatory disorders of vagina: Secondary | ICD-10-CM | POA: Diagnosis not present

## 2015-09-17 LAB — POCT URINALYSIS DIP (MANUAL ENTRY)
Bilirubin, UA: NEGATIVE
Blood, UA: NEGATIVE
Glucose, UA: NEGATIVE
Ketones, POC UA: NEGATIVE
Leukocytes, UA: NEGATIVE
Nitrite, UA: NEGATIVE
Protein Ur, POC: NEGATIVE
Spec Grav, UA: 1.02
Urobilinogen, UA: 0.2
pH, UA: 7

## 2015-09-17 LAB — POCT WET + KOH PREP
Trich by wet prep: ABSENT
Yeast by KOH: ABSENT
Yeast by wet prep: ABSENT

## 2015-09-17 NOTE — Progress Notes (Signed)
Subjective:    Patient ID: Peggy Mckenzie, female    DOB: [birth year], 24 y.o.   MRN: [id number]  HPI Patient presents for STD screen following finding out her husband has been unfaithful for the past 2 months. States that she was treated for BV 1 week ago with Flagyl for 7 days, however, discharge has gotten worse. Believes that she initially started having vaginal discharge 6 weeks ago. States that it smells worse and has more of it. Discharge white in color. Now having vaginal pain and irritation. Denies fever/chills, myalgias, pelvic/back/abdominal pain, N/V, urinary/bowel sx, vaginal bleeding. Last pap 2015 was normal. LMP 09/06/15 normal. Had Hep C screening 1 month ago when had exposure from needle stick at work and was cleared. Med allergy betadine.    Review of Systems As noted above.    Objective:   Physical Exam  Constitutional: She is oriented to person, place, and time. She appears well-developed and well-nourished. No distress.  Blood pressure 132/80, pulse 64, temperature 98.2 F (36.8 C), temperature source Oral, resp. rate 18, height  (1.575 m), weight 168 lb 12.8 oz (76.567 kg), last menstrual period 09/06/2015, SpO2 98 %, unknown if currently breastfeeding.   HENT:  Head: Normocephalic and atraumatic.  Right Ear: External ear normal.  Left Ear: External ear normal.  Eyes: Conjunctivae are normal. Right eye exhibits no discharge. Left eye exhibits no discharge. No scleral icterus.  Pulmonary/Chest: Effort normal.  Abdominal: Soft. Bowel sounds are normal. She exhibits no distension. There is no hepatosplenomegaly. There is no tenderness. There is no rebound, no guarding and no CVA tenderness. No hernia.  Genitourinary: Uterus normal. No labial fusion. There is no rash, tenderness, lesion or injury on the right labia. There is no rash, tenderness, lesion or injury on the left labia. Cervix exhibits no motion tenderness, no discharge and no friability. Right adnexum  displays no mass, no tenderness and no fullness. Left adnexum displays no mass, no tenderness and no fullness. There is tenderness in the vagina. No erythema or bleeding in the vagina. No foreign body around the vagina. No signs of injury around the vagina. Vaginal discharge (copious amount of off-white discharge) found.  Neurological: She is alert and oriented to person, place, and time.  Skin: Skin is warm and dry. No rash noted. She is not diaphoretic. No erythema. No pallor.  Psychiatric: She has a normal mood and affect. Her behavior is normal. Judgment and thought content normal.   Results for orders placed or performed in visit on 09/17/15  POCT urinalysis dipstick  Result Value Ref Range   Color, UA yellow yellow   Clarity, UA hazy (A) clear   Glucose, UA negative negative   Bilirubin, UA negative negative   Ketones, POC UA negative negative   Spec Grav, UA 1.020    Blood, UA negative negative   pH, UA 7.0    Protein Ur, POC negative negative   Urobilinogen, UA 0.2    Nitrite, UA Negative Negative   Leukocytes, UA Negative Negative  POCT Wet + KOH Prep  Result Value Ref Range   Yeast by KOH Absent Present, Absent   Yeast by wet prep Absent Present, Absent   WBC by wet prep Moderate (A) None, Few, Too numerous to count   Clue Cells Wet Prep HPF POC None None, Too numerous to count   Trich by wet prep Absent Present, Absent   Bacteria Wet Prep HPF POC Few None, Few, Too numerous to count  Epithelial Cells By [employer] Financial[employer]et Pref (UMFC) Few None, Few, Too numerous to count   RBC,UR,HPF,POC None None RBC/hpf       Assessment & Plan:  1. Vaginal discharge 2. STD exposure Wet prep neg will await rest of labs to make definitive diagnosis, but appears that BV from previous infection cleared. - POCT urinalysis dipstick - POCT Wet + KOH Prep - GC/Chlamydia Probe Amp - HIV antibody - RPR    Calvary Difranco PA-C  [facility] 09/17/2015  5:56 PM

## 2015-09-18 ENCOUNTER — Telehealth: Payer: Self-pay | Admitting: Physician Assistant

## 2015-09-18 LAB — GC/CHLAMYDIA PROBE AMP
CT Probe RNA: NEGATIVE
GC Probe RNA: NEGATIVE

## 2015-09-18 LAB — RPR

## 2015-09-18 LAB — HIV ANTIBODY (ROUTINE TESTING W REFLEX): HIV 1&2 Ab, 4th Generation: NONREACTIVE

## 2015-09-18 NOTE — Telephone Encounter (Signed)
Left vmail. Results negative.

## 2015-09-20 ENCOUNTER — Other Ambulatory Visit: Payer: Self-pay | Admitting: Obstetrics and Gynecology

## 2015-09-20 ENCOUNTER — Encounter: Payer: Self-pay | Admitting: *Deleted

## 2015-11-10 NOTE — L&D Delivery Note (Signed)
Delivery Note At 12:55 PM a viable and healthy female was delivered via Vaginal, Spontaneous Delivery (Presentation: Right Occipiput anterior;  ).  APGAR: 9, 10; weight pending .   Placenta status: spontaneous, intact.  Cord: 3V    Anesthesia:  Epidural Episiotomy: None Lacerations: None Suture Repair: na Est. Blood Loss (mL): 50  Mom to postpartum.  Baby to Couplet care / Skin to Skin.  Mykira Hofmeister H. 08/21/2016, 1:10 PM

## 2016-01-02 ENCOUNTER — Other Ambulatory Visit: Payer: Self-pay | Admitting: Obstetrics and Gynecology

## 2016-03-09 ENCOUNTER — Other Ambulatory Visit: Payer: Self-pay | Admitting: Obstetrics and Gynecology

## 2016-07-07 ENCOUNTER — Inpatient Hospital Stay (HOSPITAL_COMMUNITY)
Admission: AD | Admit: 2016-07-07 | Discharge: 2016-07-07 | Disposition: A | Discharge status: Home / Self Care | Payer: BLUE CROSS/BLUE SHIELD | Source: Ambulatory Visit | Attending: Obstetrics & Gynecology | Admitting: Obstetrics & Gynecology

## 2016-07-07 ENCOUNTER — Encounter (HOSPITAL_COMMUNITY): Payer: Self-pay | Admitting: *Deleted

## 2016-07-07 DIAGNOSIS — Z3A33 33 weeks gestation of pregnancy: Secondary | ICD-10-CM | POA: Diagnosis not present

## 2016-07-07 DIAGNOSIS — Z87440 Personal history of urinary (tract) infections: Secondary | ICD-10-CM | POA: Diagnosis not present

## 2016-07-07 DIAGNOSIS — O4703 False labor before 37 completed weeks of gestation, third trimester: Secondary | ICD-10-CM | POA: Insufficient documentation

## 2016-07-07 HISTORY — DX: Bipolar disorder, unspecified: F31.9

## 2016-07-07 HISTORY — DX: Major depressive disorder, single episode, unspecified: F32.9

## 2016-07-07 HISTORY — DX: Depression, unspecified: F32.A

## 2016-07-07 LAB — URINE MICROSCOPIC-ADD ON

## 2016-07-07 LAB — URINALYSIS, ROUTINE W REFLEX MICROSCOPIC
Bilirubin Urine: NEGATIVE
Glucose, UA: NEGATIVE mg/dL
KETONES UR: NEGATIVE mg/dL
Nitrite: NEGATIVE
PROTEIN: NEGATIVE mg/dL
Specific Gravity, Urine: 1.01 (ref 1.005–1.030)
pH: 5.5 (ref 5.0–8.0)

## 2016-07-07 LAB — FETAL FIBRONECTIN: Fetal Fibronectin: NEGATIVE

## 2016-07-07 MED ORDER — NIFEDIPINE 10 MG PO CAPS: 10.0000 mg | ORAL_CAPSULE | Freq: Two times a day (BID) | ORAL | 0 refills | Status: DC | PRN

## 2016-07-07 MED ORDER — NIFEDIPINE 10 MG PO CAPS
10.0000 mg | ORAL_CAPSULE | Freq: Once | ORAL | Status: AC
  Administered 2016-07-07: 10 mg via ORAL
  Filled 2016-07-07: qty 1

## 2016-07-07 MED ORDER — LACTATED RINGERS IV BOLUS (SEPSIS)
1000.0000 mL | Freq: Once | INTRAVENOUS | Status: AC
  Administered 2016-07-07: 1000 mL via INTRAVENOUS

## 2016-07-07 NOTE — MAU Note (Signed)
Started having some contractions and cramping this morning around 7.  The longer she worked, the worse they were getting.no bleeding or leaking. No hx of PTL. Denies urinary symptoms. Had some diarrhea yesterday

## 2016-07-07 NOTE — Discharge Instructions (Signed)

## 2016-07-07 NOTE — MAU Provider Note (Signed)
History     CSN: [id number]  Arrival date and time: 07/07/16 1407   First Provider [id number] Contact with Patient 07/07/16 1452      Chief Complaint  Patient presents with  . Contractions   HPI  Pt is [age] [id number] who presents with abd contractions like menstrual cramps. 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  Pt started having cramping and contractions as the day progressed.  Pt has ctx about 4 in 30 minutes. Pt denies bleeding or LOF. Pt had PTL with previous pregnancy and was given Procardia and carried pregnancy to term. Pt denies UTI sx,or  Constipation.  nPt has had some  diarrhea RN note: Started having some contractions and cramping this morning around 7.  The longer she worked, the worse they were getting.no bleeding or leaking. No hx of PTL. Denies urinary symptoms. Had some diarrhea yesterday  Past Medical History:  Diagnosis Date  . Bipolar 1 disorder (HCC)   . Bipolar 1 disorder, depressed (HCC)    currently stable, no meds, no problems  . Depression   . Headache   . Infection    frequent UTI's    Past Surgical History:  Procedure Laterality Date  . CYST REMOVAL HAND    . FRACTURE SURGERY Left    leg    Family History  Problem Relation Age of Onset  . Birth defects Paternal Aunt     spina bifida  . Hypertension Paternal Grandmother     Social History  Substance Use Topics  . Smoking status: Never Smoker  . Smokeless tobacco: Never Used  . Alcohol use No     Comment: occasional,, not now    Allergies:  Allergies  Allergen Reactions  . Betadine [Povidone Iodine] Itching    Prescriptions Prior to Admission  Medication Sig Dispense Refill Last Dose  . Prenatal Vit-Fe Fumarate-FA (PRENATAL MULTIVITAMIN) TABS tablet Take 1 tablet by mouth at bedtime.    07/06/2016 at Unknown time    Review of Systems  Constitutional: Negative for chills and fever.  Gastrointestinal: Positive for abdominal pain and diarrhea. Negative  for constipation, nausea and vomiting.  Genitourinary: Negative for dysuria and urgency.  Musculoskeletal: Positive for back pain.  Neurological: Negative for dizziness and headaches.   Physical Exam   Blood pressure 115/72, pulse 90, temperature 99 F (37.2 C), temperature source Oral, resp. rate 18, last menstrual period 09/06/2015, unknown if currently breastfeeding.  Physical Exam  Nursing note and vitals reviewed. Constitutional: She is oriented to person, place, and time. She appears well-developed and well-nourished. No distress.  HENT:  Head: Normocephalic.  Eyes: Pupils are equal, round, and reactive to light.  Neck: Normal range of motion. Neck supple.  Cardiovascular: Normal rate.   GI: Soft.  Ctx q 4-10 minutes- mild  Genitourinary:  Genitourinary Comments: Cervix closed inner os, parous cervix by RN  Musculoskeletal: Normal range of motion.  Neurological: She is alert and oriented to person, place, and time.  Skin: Skin is warm and dry.  Psychiatric: She has a normal mood and affect.    MAU Course  Procedures Ctx 5-9 minutes, mild with baseline 130-135 bpm 15x15 accelerations and 6-25 bpm variability Pt given Procardia and IVF  Pt's ctx ceased and pt discharged home with RX for Procardia 10mg  2 times daily for ctx. Pt has appointment in office on Thurs Results for orders placed or performed during the hospital encounter of 07/07/16 (from the past 24 hour(s))  Urinalysis, Routine  w reflex microscopic (not at [facility])     Status: Abnormal   Collection Time: 07/07/16  2:19 PM  Result Value Ref Range   Color, Urine YELLOW YELLOW   APPearance CLEAR CLEAR   Specific Gravity, Urine 1.010 1.005 - 1.030   pH 5.5 5.0 - 8.0   Glucose, UA NEGATIVE NEGATIVE mg/dL   Hgb urine dipstick TRACE (A) NEGATIVE   Bilirubin Urine NEGATIVE NEGATIVE   Ketones, ur NEGATIVE NEGATIVE mg/dL   Protein, ur NEGATIVE NEGATIVE mg/dL   Nitrite NEGATIVE NEGATIVE   Leukocytes, UA SMALL (A)  NEGATIVE  Urine microscopic-add on     Status: Abnormal   Collection Time: 07/07/16  2:19 PM  Result Value Ref Range   Squamous Epithelial / LPF 6-30 (A) NONE SEEN   WBC, UA 0-5 0 - 5 WBC/hpf   RBC / HPF 0-5 0 - 5 RBC/hpf   Bacteria, UA MANY (A) NONE SEEN  Fetal fibronectin     Status: None   Collection Time: 07/07/16  3:35 PM  Result Value Ref Range   Fetal Fibronectin NEGATIVE NEGATIVE    Assessment and Plan  Threatened preterm labor- Rx Procardia 10mg  BID prn ctx F/u in office as scheduled on Thursday Return for increase in pain, any bleeding or concerns Work note - out tomorrow then f/u in office thursday Fetal fibronectin neg Discussed with Dr. Janit Bern 07/07/2016, 2:52 PM

## 2016-07-21 ENCOUNTER — Other Ambulatory Visit: Payer: Self-pay | Admitting: Obstetrics & Gynecology

## 2016-07-21 LAB — OB RESULTS CONSOLE GBS: STREP GROUP B AG: NEGATIVE

## 2016-08-02 ENCOUNTER — Encounter (HOSPITAL_COMMUNITY): Payer: Self-pay | Admitting: *Deleted

## 2016-08-02 ENCOUNTER — Inpatient Hospital Stay (HOSPITAL_COMMUNITY)
Admission: AD | Admit: 2016-08-02 | Discharge: 2016-08-03 | Disposition: A | Discharge status: Home / Self Care | Payer: BLUE CROSS/BLUE SHIELD | Source: Ambulatory Visit | Attending: Obstetrics & Gynecology | Admitting: Obstetrics & Gynecology

## 2016-08-02 DIAGNOSIS — O4703 False labor before 37 completed weeks of gestation, third trimester: Secondary | ICD-10-CM | POA: Insufficient documentation

## 2016-08-02 DIAGNOSIS — Z3A36 36 weeks gestation of pregnancy: Secondary | ICD-10-CM | POA: Insufficient documentation

## 2016-08-02 NOTE — MAU Note (Signed)
Contractions all day, lower back pain and cramping.

## 2016-08-03 DIAGNOSIS — O4703 False labor before 37 completed weeks of gestation, third trimester: Secondary | ICD-10-CM | POA: Diagnosis not present

## 2016-08-03 DIAGNOSIS — Z3A36 36 weeks gestation of pregnancy: Secondary | ICD-10-CM | POA: Diagnosis not present

## 2016-08-19 ENCOUNTER — Other Ambulatory Visit: Payer: Self-pay | Admitting: Obstetrics and Gynecology

## 2016-08-21 ENCOUNTER — Encounter (HOSPITAL_COMMUNITY): Payer: Self-pay

## 2016-08-21 ENCOUNTER — Inpatient Hospital Stay (HOSPITAL_COMMUNITY): Payer: BLUE CROSS/BLUE SHIELD | Admitting: Anesthesiology

## 2016-08-21 ENCOUNTER — Inpatient Hospital Stay (HOSPITAL_COMMUNITY)
Admission: RE | Admit: 2016-08-21 | Discharge: 2016-08-22 | DRG: 775 | Disposition: A | Discharge status: Home / Self Care | Payer: BLUE CROSS/BLUE SHIELD | Source: Ambulatory Visit | Attending: Obstetrics and Gynecology | Admitting: Obstetrics and Gynecology

## 2016-08-21 DIAGNOSIS — Z3A39 39 weeks gestation of pregnancy: Secondary | ICD-10-CM | POA: Diagnosis not present

## 2016-08-21 DIAGNOSIS — Z8249 Family history of ischemic heart disease and other diseases of the circulatory system: Secondary | ICD-10-CM | POA: Diagnosis not present

## 2016-08-21 DIAGNOSIS — O26893 Other specified pregnancy related conditions, third trimester: Secondary | ICD-10-CM | POA: Diagnosis present

## 2016-08-21 DIAGNOSIS — Z6838 Body mass index (BMI) 38.0-38.9, adult: Secondary | ICD-10-CM | POA: Diagnosis not present

## 2016-08-21 DIAGNOSIS — O99214 Obesity complicating childbirth: Principal | ICD-10-CM | POA: Diagnosis present

## 2016-08-21 DIAGNOSIS — Z3490 Encounter for supervision of normal pregnancy, unspecified, unspecified trimester: Secondary | ICD-10-CM

## 2016-08-21 LAB — CBC
HCT: 37.6 % (ref 36.0–46.0)
HEMOGLOBIN: 12.7 g/dL (ref 12.0–15.0)
MCH: 30.5 pg (ref 26.0–34.0)
MCHC: 33.8 g/dL (ref 30.0–36.0)
MCV: 90.2 fL (ref 78.0–100.0)
Platelets: 251 10*3/uL (ref 150–400)
RBC: 4.17 MIL/uL (ref 3.87–5.11)
RDW: 13.9 % (ref 11.5–15.5)
WBC: 11.4 10*3/uL — ABNORMAL HIGH (ref 4.0–10.5)

## 2016-08-21 LAB — TYPE AND SCREEN
ABO/RH(D): O POS
Antibody Screen: NEGATIVE

## 2016-08-21 LAB — RPR: RPR Ser Ql: NONREACTIVE

## 2016-08-21 MED ORDER — LACTATED RINGERS IV SOLN
500.0000 mL | INTRAVENOUS | Status: DC | PRN
  Administered 2016-08-21: 500 mL via INTRAVENOUS

## 2016-08-21 MED ORDER — IBUPROFEN 600 MG PO TABS
600.0000 mg | ORAL_TABLET | Freq: Four times a day (QID) | ORAL | Status: DC
  Administered 2016-08-21 – 2016-08-22 (×4): 600 mg via ORAL
  Filled 2016-08-21 (×5): qty 1

## 2016-08-21 MED ORDER — LACTATED RINGERS IV SOLN
500.0000 mL | Freq: Once | INTRAVENOUS | Status: DC
Start: 2016-08-21 — End: 2016-08-21

## 2016-08-21 MED ORDER — PRENATAL MULTIVITAMIN CH
1.0000 | ORAL_TABLET | Freq: Every day | ORAL | Status: DC
  Administered 2016-08-22: 1 via ORAL
  Filled 2016-08-21: qty 1

## 2016-08-21 MED ORDER — LACTATED RINGERS IV SOLN: 500.0000 mL | Freq: Once | INTRAVENOUS | Status: DC

## 2016-08-21 MED ORDER — OXYTOCIN 40 UNITS IN LACTATED RINGERS INFUSION - SIMPLE MED: 2.5000 [IU]/h | INTRAVENOUS | Status: DC

## 2016-08-21 MED ORDER — FENTANYL 2.5 MCG/ML BUPIVACAINE 1/10 % EPIDURAL INFUSION (WH - ANES): 14.0000 mL/h | INTRAMUSCULAR | Status: DC | PRN

## 2016-08-21 MED ORDER — FLEET ENEMA 7-19 GM/118ML RE ENEM: 1.0000 | ENEMA | RECTAL | Status: DC | PRN

## 2016-08-21 MED ORDER — LIDOCAINE HCL (PF) 1 % IJ SOLN
INTRAMUSCULAR | Status: DC | PRN
  Administered 2016-08-21: 7 mL via EPIDURAL
  Administered 2016-08-21: 8 mL via EPIDURAL

## 2016-08-21 MED ORDER — DIPHENHYDRAMINE HCL 50 MG/ML IJ SOLN: 12.5000 mg | INTRAMUSCULAR | Status: DC | PRN

## 2016-08-21 MED ORDER — OXYTOCIN 40 UNITS IN LACTATED RINGERS INFUSION - SIMPLE MED: 1.0000 m[IU]/min | INTRAVENOUS | Status: DC

## 2016-08-21 MED ORDER — SENNOSIDES-DOCUSATE SODIUM 8.6-50 MG PO TABS
2.0000 | ORAL_TABLET | ORAL | Status: DC
  Administered 2016-08-22: 2 via ORAL
  Filled 2016-08-21: qty 2

## 2016-08-21 MED ORDER — ACETAMINOPHEN 325 MG PO TABS: 650.0000 mg | ORAL_TABLET | ORAL | Status: DC | PRN

## 2016-08-21 MED ORDER — LIDOCAINE HCL (PF) 1 % IJ SOLN
30.0000 mL | INTRAMUSCULAR | Status: DC | PRN
  Filled 2016-08-21: qty 30

## 2016-08-21 MED ORDER — ZOLPIDEM TARTRATE 5 MG PO TABS: 5.0000 mg | ORAL_TABLET | Freq: Every evening | ORAL | Status: DC | PRN

## 2016-08-21 MED ORDER — OXYCODONE-ACETAMINOPHEN 5-325 MG PO TABS: 2.0000 | ORAL_TABLET | ORAL | Status: DC | PRN

## 2016-08-21 MED ORDER — DIBUCAINE 1 % RE OINT: 1.0000 "application " | TOPICAL_OINTMENT | RECTAL | Status: DC | PRN

## 2016-08-21 MED ORDER — PHENYLEPHRINE 40 MCG/ML (10ML) SYRINGE FOR IV PUSH (FOR BLOOD PRESSURE SUPPORT): 80.0000 ug | PREFILLED_SYRINGE | INTRAVENOUS | Status: DC | PRN

## 2016-08-21 MED ORDER — FENTANYL 2.5 MCG/ML BUPIVACAINE 1/10 % EPIDURAL INFUSION (WH - ANES)
14.0000 mL/h | INTRAMUSCULAR | Status: DC | PRN
  Administered 2016-08-21: 14 mL/h via EPIDURAL
  Filled 2016-08-21: qty 125

## 2016-08-21 MED ORDER — COCONUT OIL OIL: 1.0000 "application " | TOPICAL_OIL | Status: DC | PRN

## 2016-08-21 MED ORDER — ONDANSETRON HCL 4 MG PO TABS: 4.0000 mg | ORAL_TABLET | ORAL | Status: DC | PRN

## 2016-08-21 MED ORDER — OXYTOCIN 40 UNITS IN LACTATED RINGERS INFUSION - SIMPLE MED
1.0000 m[IU]/min | INTRAVENOUS | Status: DC
  Administered 2016-08-21: 1 m[IU]/min via INTRAVENOUS
  Filled 2016-08-21: qty 1000

## 2016-08-21 MED ORDER — METHYLERGONOVINE MALEATE 0.2 MG PO TABS: 0.2000 mg | ORAL_TABLET | ORAL | Status: DC | PRN

## 2016-08-21 MED ORDER — PHENYLEPHRINE 40 MCG/ML (10ML) SYRINGE FOR IV PUSH (FOR BLOOD PRESSURE SUPPORT)
80.0000 ug | PREFILLED_SYRINGE | INTRAVENOUS | Status: DC | PRN
  Filled 2016-08-21: qty 5

## 2016-08-21 MED ORDER — METHYLERGONOVINE MALEATE 0.2 MG/ML IJ SOLN: 0.2000 mg | INTRAMUSCULAR | Status: DC | PRN

## 2016-08-21 MED ORDER — PHENYLEPHRINE 40 MCG/ML (10ML) SYRINGE FOR IV PUSH (FOR BLOOD PRESSURE SUPPORT)
80.0000 ug | PREFILLED_SYRINGE | INTRAVENOUS | Status: DC | PRN
  Filled 2016-08-21: qty 10
  Filled 2016-08-21: qty 5

## 2016-08-21 MED ORDER — EPHEDRINE 5 MG/ML INJ
10.0000 mg | INTRAVENOUS | Status: DC | PRN
  Filled 2016-08-21: qty 4

## 2016-08-21 MED ORDER — SOD CITRATE-CITRIC ACID 500-334 MG/5ML PO SOLN: 30.0000 mL | ORAL | Status: DC | PRN

## 2016-08-21 MED ORDER — BENZOCAINE-MENTHOL 20-0.5 % EX AERO: 1.0000 "application " | INHALATION_SPRAY | CUTANEOUS | Status: DC | PRN

## 2016-08-21 MED ORDER — EPHEDRINE 5 MG/ML INJ: 10.0000 mg | INTRAVENOUS | Status: DC | PRN

## 2016-08-21 MED ORDER — TETANUS-DIPHTH-ACELL PERTUSSIS 5-2.5-18.5 LF-MCG/0.5 IM SUSP: 0.5000 mL | Freq: Once | INTRAMUSCULAR | Status: DC

## 2016-08-21 MED ORDER — LACTATED RINGERS IV SOLN
INTRAVENOUS | Status: DC
  Administered 2016-08-21 (×2): 125 mL/h via INTRAVENOUS

## 2016-08-21 MED ORDER — WITCH HAZEL-GLYCERIN EX PADS: 1.0000 "application " | MEDICATED_PAD | CUTANEOUS | Status: DC | PRN

## 2016-08-21 MED ORDER — OXYCODONE-ACETAMINOPHEN 5-325 MG PO TABS: 1.0000 | ORAL_TABLET | ORAL | Status: DC | PRN

## 2016-08-21 MED ORDER — OXYTOCIN BOLUS FROM INFUSION
500.0000 mL | Freq: Once | INTRAVENOUS | Status: AC
  Administered 2016-08-21: 500 mL via INTRAVENOUS

## 2016-08-21 MED ORDER — ONDANSETRON HCL 4 MG/2ML IJ SOLN: 4.0000 mg | Freq: Four times a day (QID) | INTRAMUSCULAR | Status: DC | PRN

## 2016-08-21 MED ORDER — DIPHENHYDRAMINE HCL 25 MG PO CAPS: 25.0000 mg | ORAL_CAPSULE | Freq: Four times a day (QID) | ORAL | Status: DC | PRN

## 2016-08-21 MED ORDER — METHYLERGONOVINE MALEATE 0.2 MG/ML IJ SOLN
0.2000 mg | Freq: Once | INTRAMUSCULAR | Status: AC
  Administered 2016-08-21: 0.2 mg via INTRAMUSCULAR
  Filled 2016-08-21: qty 1

## 2016-08-21 MED ORDER — ONDANSETRON HCL 4 MG/2ML IJ SOLN: 4.0000 mg | INTRAMUSCULAR | Status: DC | PRN

## 2016-08-21 MED ORDER — TERBUTALINE SULFATE 1 MG/ML IJ SOLN
0.2500 mg | Freq: Once | INTRAMUSCULAR | Status: DC | PRN
  Filled 2016-08-21: qty 1

## 2016-08-21 MED ORDER — SIMETHICONE 80 MG PO CHEW: 80.0000 mg | CHEWABLE_TABLET | ORAL | Status: DC | PRN

## 2016-08-21 NOTE — Anesthesia Procedure Notes (Signed)
Epidural Patient location during procedure: OB Start time: 08/21/2016 10:50 AM End time: 08/21/2016 10:54 AM  Staffing Anesthesiologist: Leilani AbleHATCHETT, Jovanny Tobey Performed: anesthesiologist   Preanesthetic Checklist Completed: patient identified, surgical consent, pre-op evaluation, timeout performed, IV checked, risks and benefits discussed and monitors and equipment checked  Epidural Patient position: sitting Prep: site prepped and draped and DuraPrep Patient monitoring: continuous pulse ox and blood pressure Approach: midline Location: L3-L4 Injection technique: LOR air  Needle:  Needle type: Tuohy  Needle gauge: 17 G Needle length: 9 cm and 9 Needle insertion depth: 7 cm Catheter type: closed end flexible Catheter size: 19 Gauge Catheter at skin depth: 12 cm Test dose: negative  Assessment Sensory level: T9 Events: blood not aspirated, injection not painful, no injection resistance, negative IV test and no paresthesia  Additional Notes Reason for block:procedure for pain

## 2016-08-21 NOTE — Anesthesia Preprocedure Evaluation (Signed)
Anesthesia Evaluation  Patient identified by MRN, date of birth, ID band Patient awake    Reviewed: Allergy & Precautions, H&P , NPO status , Patient's Chart, lab work & pertinent test results  Airway Mallampati: II  TM Distance: >3 FB Neck ROM: full    Dental no notable dental hx.    Pulmonary neg pulmonary ROS,    Pulmonary exam normal        Cardiovascular negative cardio ROS Normal cardiovascular exam     Neuro/Psych    GI/Hepatic negative GI ROS, Neg liver ROS,   Endo/Other  Morbid obesity  Renal/GU negative Renal ROS     Musculoskeletal   Abdominal (+) + obese,   Peds  Hematology negative hematology ROS (+)   Anesthesia Other Findings   Reproductive/Obstetrics (+) Pregnancy                             Anesthesia Physical Anesthesia Plan  ASA: III  Anesthesia Plan: Epidural   Post-op Pain Management:    Induction:   Airway Management Planned:   Additional Equipment:   Intra-op Plan:   Post-operative Plan:   Informed Consent: I have reviewed the patients History and Physical, chart, labs and discussed the procedure including the risks, benefits and alternatives for the proposed anesthesia with the patient or authorized representative who has indicated his/her understanding and acceptance.     Plan Discussed with:   Anesthesia Plan Comments:         Anesthesia Quick Evaluation  

## 2016-08-21 NOTE — Anesthesia Pain Management Evaluation Note (Signed)
  [facility] Pain Management Visit Note  Patient: Peggy EdmanStephanie R Palazzi, 25 y.o., female  "Hello I am a member of the anesthesia team at [facility]. We have an anesthesia team available at all times to provide care throughout the hospital, including epidural management and anesthesia for C-section. I don't know your plan for the delivery whether it a natural birth, water birth, IV sedation, nitrous supplementation, doula or epidural, but we want to meet your pain goals."   1.Was your pain managed to your expectations on prior hospitalizations?   Yes   2.What is your expectation for pain management during this hospitalization?     Epidural  3.How can we help you reach that goal? Epidural now.  Record the patient's initial score and the patient's pain goal.   Pain: 5  Pain Goal: 3 [facility] wants you to be able to say your pain was always managed very well.  [name] 08/21/2016

## 2016-08-21 NOTE — H&P (Signed)
Peggy EdmanStephanie R Franchi is a 25 y.o. female presenting for elective induction of labor  25 yo [id number] @ 39+3 presents for elective IOL. Her pregnancy has been uncomplicated to this point.  OB History    Gravida Para Term Preterm AB Living   4 2 2   1 2    SAB TAB Ectopic Multiple Live Births   1     0 2     Past Medical History:  Diagnosis Date  . Bipolar 1 disorder (HCC)   . Bipolar 1 disorder, depressed (HCC)    currently stable, no meds, no problems  . Depression   . Headache   . Infection    frequent UTI's   Past Surgical History:  Procedure Laterality Date  . CYST REMOVAL HAND    . FRACTURE SURGERY Left    leg   Family History: family history includes Birth defects in her paternal aunt; Hypertension in her paternal grandmother. Social History:  reports that she has never smoked. She has never used smokeless tobacco. She reports that she does not drink alcohol or use drugs.     Maternal Diabetes: No Genetic Screening: Normal Maternal Ultrasounds/Referrals: Normal Fetal Ultrasounds or other Referrals:  None Maternal Substance Abuse:  No Significant Maternal Medications:  None Significant Maternal Lab Results:  None Other Comments:  None  ROS History Dilation: 4 Effacement (%): 50 Station: -3 Exam by:: MD Cambryn Charters Blood pressure 126/67, pulse 76, temperature 98.2 F (36.8 C), temperature source Oral, resp. rate 16, height 5\' 3"  (1.6 m), weight 99.3 kg (219 lb), last menstrual period 09/06/2015, unknown if currently breastfeeding. Exam Physical Exam  Prenatal labs: ABO, Rh:  O pos Antibody:  Neg Rubella:  Imm RPR: NON REAC (11/08 1443)  HBsAg:   Neg HIV: NONREACTIVE (11/08 1443)  GBS: Negative (09/12 0000)   Assessment/Plan: 1) Admit 2) Pitocin 3) AROM when able 4) Epidural on request   Masud Holub H. 08/21/2016, 10:05 AM

## 2016-08-21 NOTE — Anesthesia Postprocedure Evaluation (Signed)
Anesthesia Post Note  Patient: Peggy EdmanStephanie R Mckenzie  Procedure(s) Performed: * No procedures listed *  Patient location during evaluation: Mother Baby Anesthesia Type: Epidural Level of consciousness: awake Pain management: pain level controlled Vital Signs Assessment: post-procedure vital signs reviewed and stable Respiratory status: spontaneous breathing Cardiovascular status: stable Postop Assessment: no headache, no backache, epidural receding, patient able to bend at knees, no signs of nausea or vomiting and adequate PO intake Anesthetic complications: no     Last Vitals:  Vitals:   08/21/16 1535 08/21/16 1640  BP: (!) 141/80 133/74  Pulse: 89 91  Resp: 20 18  Temp: 36.9 C 37.2 C    Last Pain:  Vitals:   08/21/16 1640  TempSrc: Oral  PainSc: 0-No pain   Pain Goal:                 [name]

## 2016-08-22 ENCOUNTER — Encounter (HOSPITAL_COMMUNITY): Payer: Self-pay

## 2016-08-22 LAB — CBC
HEMATOCRIT: 34.2 % — AB (ref 36.0–46.0)
HEMOGLOBIN: 11.6 g/dL — AB (ref 12.0–15.0)
MCH: 30.6 pg (ref 26.0–34.0)
MCHC: 33.9 g/dL (ref 30.0–36.0)
MCV: 90.2 fL (ref 78.0–100.0)
Platelets: 232 10*3/uL (ref 150–400)
RBC: 3.79 MIL/uL — ABNORMAL LOW (ref 3.87–5.11)
RDW: 13.9 % (ref 11.5–15.5)
WBC: 12 10*3/uL — ABNORMAL HIGH (ref 4.0–10.5)

## 2016-08-22 MED ORDER — DOCUSATE SODIUM 100 MG PO CAPS: 100.0000 mg | ORAL_CAPSULE | Freq: Two times a day (BID) | ORAL | 0 refills | Status: DC

## 2016-08-22 MED ORDER — IBUPROFEN 600 MG PO TABS: 600.0000 mg | ORAL_TABLET | Freq: Four times a day (QID) | ORAL | 0 refills | Status: DC | PRN

## 2016-08-22 MED ORDER — OXYCODONE-ACETAMINOPHEN 5-325 MG PO TABS: 1.0000 | ORAL_TABLET | ORAL | 0 refills | Status: DC | PRN

## 2016-08-22 NOTE — Discharge Summary (Signed)
Obstetric Discharge Summary Reason for Admission: induction of labor Prenatal Procedures: ultrasound Intrapartum Procedures: spontaneous vaginal delivery Postpartum Procedures: none Complications-Operative and Postpartum: none Hemoglobin  Date Value Ref Range Status  08/22/2016 11.6 (L) 12.0 - 15.0 g/dL Final   HCT  Date Value Ref Range Status  08/22/2016 34.2 (L) 36.0 - 46.0 % Final    Physical Exam:  General: alert, cooperative and appears stated age 61Lochia: appropriate Uterine Fundus: firm  Discharge Diagnoses: Term Pregnancy-delivered  Discharge Information: Date: 08/22/2016 Activity: pelvic rest Diet: routine Medications: Ibuprofen, Colace and Percocet Condition: improved Instructions: refer to practice specific booklet Discharge to: home [name] Information    Courtnay Petrilla H., MD Follow up in 4 week(s).   Specialty:  Obstetrics and Gynecology Why:  follow-up for a postpartum evaluation  Contact information: [address] [place] [address] [phone]           Newborn Data: Live born female  Birth Weight: 7 lb 9.2 oz (3435 g) APGAR: 9, 10  Home with mother.  Jaamal Farooqui H. 08/22/2016, 10:52 AM

## 2016-08-22 NOTE — Clinical Social Work Maternal (Signed)
  CLINICAL SOCIAL WORK MATERNAL/CHILD NOTE  Patient Details  Name: Peggy Mckenzie MRN: [id number] Date of Birth: 02/17/1991  Date:  17-Nov-2015  Clinical Social Worker Initiating Note:  Laurey Arrow Date/ Time Initiated:  08/22/16/1229     Child's Name:  [facility]   Legal Guardian:  Mother   Need for Interpreter:  None   Date of Referral:  2015-12-29     Reason for Referral:  Behavioral Health Issues, including SI    Referral Source:  [employer]   Address:  [place]. [place] [address]  Phone number:  [phone]   Household Members:  Self, Minor Children, Spouse   Natural Supports (not living in the home):  Spouse/significant other, Extended Family, Parent   Professional Supports: Transport planner ([facility])   Employment: Full-time   Type of Work: Passenger transport manager   Education:  Chiropractor Resources:  Multimedia programmer   Other Resources:      Cultural/Religious Considerations Which May Impact Care: None Reported  Strengths:  Ability to meet basic needs , Lexicographer chosen , Home prepared for child , Understanding of illness   Risk Factors/Current Problems:  Mental Health Concerns    Cognitive State:  Able to Concentrate , Alert , Linear Thinking , Insightful    Mood/Affect:  Bright , Happy , Interested , Comfortable    CSW Assessment: CSW met with MOB to complete an assessment for a consult for bipolar dx).  MOB was inviting, polite, and interested in meeting with CSW. CSW inquired about MOB's supports, and MOB stated that she and FOB/Husband resides together, and the family has a wealth of supports.  MOB expressed that she and FOB are excited about being new parents again and this is there first daughter. CSW inquired about MOB's MH hx and MOB acknowledged a hx of bipolar dx.  MOB reported that MOB was dx after MOB's first child and MOB has been consistent with medication  management. CSW educated MOB about PPD.  CSW informed MOB of possible supports and interventions to decrease PPD.  CSW also encouraged MOB to seek medical attention if needed for increased signs, symptoms for PPD. CSW offered MOB resources for outpatient MH counseling, and MOB declined.  MOB communicated that she is currently receiving counseling and medication management at [facility].  Per MOB, MOB's next appointment is schedule for November 3rd.  However, MOB is hoping that the practice has a patient cancelation and MOB can receive an appointment sooner. CSW praised MOB for being proactive about MOB's MH.  CSW reviewed safe sleep, and SIDS. MOB was knowledgeable and asked appropriate questions.  MOB communicated that she has a bassinet for the baby, and has read information regarding SIDS. CSW thanked MOB for MOB's willingness to meet with CSW.  MOB did not have any further questions, concerns, or needs at this time.   CSW Plan/Description:  No Further Intervention Required/No Barriers to Discharge, Information/Referral to [employer] , Patient/Family Education    Laurey Arrow, MSW, [employer] Clinical Social Work [phone]    Dimple Nanas, LCSW 07-18-16, 12:42 PM

## 2016-08-22 NOTE — Progress Notes (Signed)
Post Partum Day 1 Subjective: no complaints, up ad lib, voiding and tolerating PO  Objective: Blood pressure (!) 102/56, pulse 67, temperature 97.9 F (36.6 C), temperature source Oral, resp. rate 18, height 5\' 3"  (1.6 m), weight 99.3 kg (219 lb), last menstrual period 09/06/2015, SpO2 99 %, unknown if currently breastfeeding.  Physical Exam:  General: alert, cooperative and appears stated age Lochia: appropriate Uterine Fundus: firm   Recent Labs  08/21/16 0800 08/22/16 0554  HGB 12.7 11.6*  HCT 37.6 34.2*    Assessment/Plan: Pt desires early discharge Continue routine PP care   LOS: 1 day   Honour Schwieger H. 08/22/2016, 10:48 AM

## 2016-08-22 NOTE — Discharge Instructions (Signed)

## 2016-09-22 ENCOUNTER — Other Ambulatory Visit: Payer: Self-pay | Admitting: Obstetrics and Gynecology

## 2016-09-23 LAB — CYTOLOGY - PAP

## 2016-10-28 ENCOUNTER — Other Ambulatory Visit: Payer: Self-pay | Admitting: Obstetrics and Gynecology

## 2019-11-10 DIAGNOSIS — O2300 Infections of kidney in pregnancy, unspecified trimester: Secondary | ICD-10-CM

## 2019-11-10 HISTORY — DX: Infections of kidney in pregnancy, unspecified trimester: O23.00

## 2020-04-21 ENCOUNTER — Emergency Department (HOSPITAL_COMMUNITY): Payer: 59

## 2020-04-21 ENCOUNTER — Encounter (HOSPITAL_COMMUNITY): Payer: Self-pay | Admitting: Emergency Medicine

## 2020-04-21 ENCOUNTER — Emergency Department (HOSPITAL_COMMUNITY)
Admission: EM | Admit: 2020-04-21 | Discharge: 2020-04-21 | Disposition: A | Discharge status: Home / Self Care | Payer: 59 | Attending: Emergency Medicine | Admitting: Emergency Medicine

## 2020-04-21 ENCOUNTER — Other Ambulatory Visit: Payer: Self-pay

## 2020-04-21 DIAGNOSIS — S0083XA Contusion of other part of head, initial encounter: Secondary | ICD-10-CM | POA: Insufficient documentation

## 2020-04-21 DIAGNOSIS — Y9289 Other specified places as the place of occurrence of the external cause: Secondary | ICD-10-CM | POA: Insufficient documentation

## 2020-04-21 DIAGNOSIS — M25522 Pain in left elbow: Secondary | ICD-10-CM | POA: Diagnosis not present

## 2020-04-21 DIAGNOSIS — S1083XA Contusion of other specified part of neck, initial encounter: Secondary | ICD-10-CM | POA: Diagnosis not present

## 2020-04-21 DIAGNOSIS — R519 Headache, unspecified: Secondary | ICD-10-CM

## 2020-04-21 DIAGNOSIS — S59901A Unspecified injury of right elbow, initial encounter: Secondary | ICD-10-CM | POA: Diagnosis not present

## 2020-04-21 DIAGNOSIS — Y939 Activity, unspecified: Secondary | ICD-10-CM | POA: Diagnosis not present

## 2020-04-21 DIAGNOSIS — Y999 Unspecified external cause status: Secondary | ICD-10-CM | POA: Insufficient documentation

## 2020-04-21 DIAGNOSIS — Z79891 Long term (current) use of opiate analgesic: Secondary | ICD-10-CM | POA: Insufficient documentation

## 2020-04-21 DIAGNOSIS — M5481 Occipital neuralgia: Secondary | ICD-10-CM | POA: Diagnosis not present

## 2020-04-21 DIAGNOSIS — M25521 Pain in right elbow: Secondary | ICD-10-CM

## 2020-04-21 DIAGNOSIS — M79642 Pain in left hand: Secondary | ICD-10-CM | POA: Diagnosis not present

## 2020-04-21 MED ORDER — CYCLOBENZAPRINE HCL 10 MG PO TABS: 10.0000 mg | ORAL_TABLET | Freq: Two times a day (BID) | ORAL | 0 refills | Status: DC | PRN

## 2020-04-21 MED ORDER — ACETAMINOPHEN 500 MG PO TABS: 500.0000 mg | ORAL_TABLET | Freq: Four times a day (QID) | ORAL | 0 refills | Status: DC | PRN

## 2020-04-21 MED ORDER — IBUPROFEN 600 MG PO TABS: 600.0000 mg | ORAL_TABLET | Freq: Four times a day (QID) | ORAL | 0 refills | Status: DC | PRN

## 2020-04-21 MED ORDER — ACETAMINOPHEN 500 MG PO TABS
1000.0000 mg | ORAL_TABLET | Freq: Once | ORAL | Status: AC
  Administered 2020-04-21: 1000 mg via ORAL
  Filled 2020-04-21: qty 2
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
## 2020-04-21 NOTE — Discharge Instructions (Addendum)
1. Medications: Alternate 600 mg of ibuprofen and [id number] mg of Tylenol every 3-6 hours as needed for pain. Do not exceed 4000 mg of Tylenol daily.  Take ibuprofen with food to avoid upset stomach issues.  Take Flexeril as needed for muscle relaxation but do not drive, drink alcohol or operate heavy machinery while taking this medication as it can cause drowsiness.  You can also cut the tablets in half if they feel too strong. 2. Treatment: rest, apply ice or heat whichever feels best 20 minutes at a time, take hot showers and hot baths and do some gentle stretching, elevate and use brace, drink plenty of fluids 3. Follow Up: Please followup with orthopedics as directed or your PCP in 1 week if no improvement for discussion of your diagnoses and further evaluation after today's visit; if you do not have a primary care doctor use the resource guide provided to find one; Please return to the ER for worsening symptoms or other concerns such as worsening swelling, redness of the skin, severe headaches, vomiting, shortness of breath or chest pain, fevers, loss of pulses, or loss of feeling
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
## 2020-04-24 DIAGNOSIS — S46911A Strain of unspecified muscle, fascia and tendon at shoulder and upper arm level, right arm, initial encounter: Secondary | ICD-10-CM | POA: Diagnosis not present

## 2020-05-17 DIAGNOSIS — S46911D Strain of unspecified muscle, fascia and tendon at shoulder and upper arm level, right arm, subsequent encounter: Secondary | ICD-10-CM | POA: Diagnosis not present

## 2020-07-16 ENCOUNTER — Emergency Department (HOSPITAL_COMMUNITY)
Admission: EM | Admit: 2020-07-16 | Discharge: 2020-07-16 | Disposition: A | Discharge status: Home / Self Care | Payer: 59 | Attending: Emergency Medicine | Admitting: Emergency Medicine

## 2020-07-16 ENCOUNTER — Emergency Department (HOSPITAL_COMMUNITY): Payer: 59

## 2020-07-16 ENCOUNTER — Encounter (HOSPITAL_COMMUNITY): Payer: Self-pay | Admitting: Emergency Medicine

## 2020-07-16 ENCOUNTER — Other Ambulatory Visit: Payer: Self-pay

## 2020-07-16 DIAGNOSIS — N10 Acute tubulo-interstitial nephritis: Secondary | ICD-10-CM | POA: Diagnosis not present

## 2020-07-16 DIAGNOSIS — R109 Unspecified abdominal pain: Secondary | ICD-10-CM | POA: Diagnosis present

## 2020-07-16 DIAGNOSIS — N12 Tubulo-interstitial nephritis, not specified as acute or chronic: Secondary | ICD-10-CM

## 2020-07-16 DIAGNOSIS — N200 Calculus of kidney: Secondary | ICD-10-CM | POA: Diagnosis not present

## 2020-07-16 DIAGNOSIS — N3289 Other specified disorders of bladder: Secondary | ICD-10-CM | POA: Diagnosis not present

## 2020-07-16 LAB — URINALYSIS, ROUTINE W REFLEX MICROSCOPIC
Bilirubin Urine: NEGATIVE
Glucose, UA: NEGATIVE mg/dL
Hgb urine dipstick: NEGATIVE
Ketones, ur: NEGATIVE mg/dL
Nitrite: POSITIVE — AB
Protein, ur: NEGATIVE mg/dL
Specific Gravity, Urine: 1.012 (ref 1.005–1.030)
WBC, UA: >50 WBC/hpf — ABNORMAL HIGH (ref 0–5)
pH: 7 (ref 5.0–8.0)

## 2020-07-16 LAB — CBC
HCT: 44.6 % (ref 36.0–46.0)
Hemoglobin: 14.1 g/dL (ref 12.0–15.0)
MCH: 28.5 pg (ref 26.0–34.0)
MCHC: 31.6 g/dL (ref 30.0–36.0)
MCV: 90.1 fL (ref 80.0–100.0)
Platelets: 371 10*3/uL (ref 150–400)
RBC: 4.95 MIL/uL (ref 3.87–5.11)
RDW: 13.6 % (ref 11.5–15.5)
WBC: 9.5 10*3/uL (ref 4.0–10.5)
nRBC: 0 % (ref 0.0–0.2)

## 2020-07-16 LAB — BASIC METABOLIC PANEL
Anion gap: 9 (ref 5–15)
BUN: 9 mg/dL (ref 6–20)
CO2: 27 mmol/L (ref 22–32)
Calcium: 9.3 mg/dL (ref 8.9–10.3)
Chloride: 102 mmol/L (ref 98–111)
Creatinine, Ser: 0.72 mg/dL (ref 0.44–1.00)
GFR calc Af Amer: >60 mL/min (ref >60)
GFR calc non Af Amer: >60 mL/min (ref >60)
Glucose, Bld: 90 mg/dL (ref 70–99)
Potassium: 4.7 mmol/L (ref 3.5–5.1)
Sodium: 138 mmol/L (ref 135–145)

## 2020-07-16 LAB — I-STAT BETA HCG BLOOD, ED (MC, WL, AP ONLY): I-stat hCG, quantitative: <5 m[IU]/mL (ref <5)

## 2020-07-16 MED ORDER — IBUPROFEN 800 MG PO TABS: 800.0000 mg | ORAL_TABLET | Freq: Three times a day (TID) | ORAL | 0 refills | Status: DC

## 2020-07-16 MED ORDER — SULFAMETHOXAZOLE-TRIMETHOPRIM 800-160 MG PO TABS: 1.0000 | ORAL_TABLET | Freq: Two times a day (BID) | ORAL | 0 refills | Status: AC

## 2020-07-16 MED ORDER — KETOROLAC TROMETHAMINE 60 MG/2ML IM SOLN
60.0000 mg | Freq: Once | INTRAMUSCULAR | Status: AC
  Administered 2020-07-16: 60 mg via INTRAMUSCULAR
  Filled 2020-07-16: qty 2

## 2020-07-16 MED ORDER — ACETAMINOPHEN 500 MG PO TABS
1000.0000 mg | ORAL_TABLET | Freq: Once | ORAL | Status: AC
  Administered 2020-07-16: 1000 mg via ORAL
  Filled 2020-07-16: qty 2

## 2020-07-16 NOTE — Discharge Instructions (Addendum)
Your workup today was consistent with a UTI that most likely spread to your kidneys.   Your CT scan did show a kidney stone in the left kidney. This could be contributing to your pain. It has not tried to move out yet.   I have provided you an outpatient Urology referral.   Please take the antibiotic as prescribed until finished even if your symptoms resolve. You may take Ibuprofen for pain.   Return to the ER for worsening pain, fevers, chills, nausea, vomiting, etc

## 2020-07-16 NOTE — ED Triage Notes (Signed)
Per pt, states left flank pain since this am-no dysuria-history of kidney stones-hospitalized last year for kidney infection

## 2020-07-16 NOTE — ED Provider Notes (Signed)
[facility]-EMERGENCY DEPT Provider Note   CSN: [id number] Arrival date & time: 07/16/20  0263     History Chief Complaint  Patient presents with  . Flank Pain    Peggy Mckenzie is a 29 y.o. female.  HPI 29 year old female with a history of bipolar 1 disorder, depression, frequent UTIs presents to the ER with complaints of left-sided flank pain that started this morning.  Patient states that this morning she felt a little bit of discomfort while urinating, and then proceeded to have left-sided flank pain.  Patient has a frequent history of kidney stones and has required lithotripsy before.  Also endorsed sepsis from pyelonephritis prior.  States that this pain feels like she has a kidney stone.  Denies any nausea or vomiting.  No fevers or chills.  Denies any vaginal symptoms.  She is sexually active with one partner.  Denies any vaginal bleeding.  Normal bowel movements, no bleeding.  No chest pain or shortness of breath.    Past Medical History:  Diagnosis Date  . Bipolar 1 disorder (HCC)   . Bipolar 1 disorder, depressed (HCC)    currently stable, no meds, no problems  . Depression   . Headache   . Infection    frequent UTI's    Patient Active Problem List   Diagnosis Date Noted  . Spontaneous vaginal delivery 08/21/2016  . Postpartum state 10/18/2014  . Pregnancy 10/17/2014  . Abdominal pain, unspecified site 06/29/2013  . BV (bacterial vaginosis) 06/29/2013  . Side pain 06/11/2013    Past Surgical History:  Procedure Laterality Date  . CYST REMOVAL HAND    . FRACTURE SURGERY Left    leg     OB History    Gravida  4   Para  3   Term  3   Preterm      AB  1   Living  3     SAB  1   TAB      Ectopic      Multiple  0   Live Births  3           Family History  Problem Relation Age of Onset  . Birth defects Paternal Aunt        spina bifida  . Hypertension Paternal Grandmother     Social History   Tobacco Use    . Smoking status: Never Smoker  . Smokeless tobacco: Never Used  Substance Use Topics  . Alcohol use: No    Comment: occasional,, not now  . Drug use: No    Home Medications Prior to Admission medications   Medication Sig Start Date End Date Taking? Authorizing Provider  acetaminophen (TYLENOL) 500 MG tablet Take 1 tablet (500 mg total) by mouth every 6 (six) hours as needed. 04/21/20   Fawze, Mina A, PA-C  cyclobenzaprine (FLEXERIL) 10 MG tablet Take 1 tablet (10 mg total) by mouth 2 (two) times daily as needed for muscle spasms. 04/21/20   Fawze, Mina A, PA-C  docusate sodium (COLACE) 100 MG capsule Take 1 capsule (100 mg total) by mouth 2 (two) times daily. 08/22/16   Waynard Reeds, MD  ibuprofen (ADVIL) 800 MG tablet Take 1 tablet (800 mg total) by mouth 3 (three) times daily. 07/16/20   Mare Ferrari, PA-C  oxyCODONE-acetaminophen (ROXICET) 5-325 MG tablet Take 1-2 tablets by mouth every 4 (four) hours as needed for severe pain. 08/22/16   Waynard Reeds, MD  Prenatal Vit-Fe Fumarate-FA (PRENATAL MULTIVITAMIN)  TABS tablet Take 1 tablet by mouth at bedtime.     [provider]  sulfamethoxazole-trimethoprim (BACTRIM DS) 800-160 MG tablet Take 1 tablet by mouth 2 (two) times daily for 10 days. 07/16/20 07/26/20  Mare Ferrari, PA-C    Allergies    Betadine [povidone iodine]  Review of Systems   Review of Systems  Constitutional: Negative for chills and fever.  HENT: Negative for ear pain and sore throat.   Eyes: Negative for pain and visual disturbance.  Respiratory: Negative for cough and shortness of breath.   Cardiovascular: Negative for chest pain and palpitations.  Gastrointestinal: Negative for abdominal pain and vomiting.  Genitourinary: Positive for dysuria and flank pain. Negative for hematuria.  Musculoskeletal: Positive for back pain. Negative for arthralgias.  Skin: Negative for color change and rash.  Neurological: Negative for seizures and syncope.  All other  systems reviewed and are negative.   Physical Exam Updated Vital Signs BP (!) 159/94   Pulse 74   Temp 98.4 F (36.9 C) (Oral)   Resp 18   Ht 5\' 3"  (1.6 m)   Wt 86.2 kg   LMP 07/02/2020   SpO2 100%   BMI 33.66 kg/m   Physical Exam Vitals reviewed.  Constitutional:      General: She is not in acute distress.    Appearance: Normal appearance. She is obese. She is not ill-appearing, toxic-appearing or diaphoretic.     Comments: Uncomfortable appearing  HENT:     Head: Normocephalic and atraumatic.  Eyes:     General:        Right eye: No discharge.        Left eye: No discharge.     Extraocular Movements: Extraocular movements intact.     Conjunctiva/sclera: Conjunctivae normal.  Cardiovascular:     Rate and Rhythm: Normal rate and regular rhythm.     Pulses: Normal pulses.     Heart sounds: Normal heart sounds.  Pulmonary:     Effort: Pulmonary effort is normal.     Breath sounds: Normal breath sounds.  Abdominal:     General: Abdomen is flat.     Tenderness: There is no abdominal tenderness. There is right CVA tenderness and left CVA tenderness.  Musculoskeletal:        General: No swelling or tenderness. Normal range of motion.  Skin:    General: Skin is warm and dry.  Neurological:     General: No focal deficit present.     Mental Status: She is alert and oriented to person, place, and time.  Psychiatric:        Mood and Affect: Mood normal.        Behavior: Behavior normal.     ED Results / Procedures / Treatments   Labs (all labs ordered are listed, but only abnormal results are displayed) Labs Reviewed  URINALYSIS, ROUTINE W REFLEX MICROSCOPIC - Abnormal; Notable for the following components:      Result Value   APPearance HAZY (*)    Nitrite POSITIVE (*)    Leukocytes,Ua LARGE (*)    WBC, UA >50 (*)    Bacteria, UA MANY (*)    All other components within normal limits  URINE CULTURE  BASIC METABOLIC PANEL  CBC  I-STAT BETA HCG BLOOD, ED ([facility],  [facility], [facility] ONLY)    EKG None  Radiology CT Renal Stone Study  Result Date: 07/16/2020 CLINICAL DATA:  Left-sided flank pain for several hours EXAM: CT ABDOMEN AND PELVIS WITHOUT  CONTRAST TECHNIQUE: Multidetector CT imaging of the abdomen and pelvis was performed following the standard protocol without IV contrast. COMPARISON:  None. FINDINGS: Lower chest: No acute abnormality. Hepatobiliary: No focal liver abnormality is seen. No gallstones, gallbladder wall thickening, or biliary dilatation. Pancreas: Unremarkable. No pancreatic ductal dilatation or surrounding inflammatory changes. Spleen: Normal in size without focal abnormality. Adrenals/Urinary Tract: Adrenal glands are within normal limits. Kidneys are well visualized bilaterally. Multiple nonobstructing renal calculi are noted on the left the largest of these measuring approximately 3 mm. Left ureter is within normal limits. Bladder is partially distended. Some scarring is noted in the lower pole of the right kidney. No ureteral dilatation is seen. Stomach/Bowel: Appendix is within normal limits. No obstructive or inflammatory changes of the colon are seen. Small bowel is within normal limits. Stomach is unremarkable. Vascular/Lymphatic: No significant vascular findings are present. No enlarged abdominal or pelvic lymph nodes. Reproductive: Uterus and bilateral adnexa are unremarkable. Other: No abdominal wall hernia or abnormality. No abdominopelvic ascites. Musculoskeletal: No acute or significant osseous findings. IMPRESSION: Nonobstructing left renal calculi. No hydronephrosis is seen. No ureteral stones are noted. No other focal abnormality is noted. Electronically Signed   By: Alcide Clever M.D.   On: 07/16/2020 14:47    Procedures Procedures (including critical care time)  Medications Ordered in ED Medications  ketorolac (TORADOL) injection 60 mg (60 mg Intramuscular Given 07/16/20 1456)    ED Course  I have reviewed the triage vital signs  and the nursing notes.  Pertinent labs & imaging results that were available during my care of the patient were reviewed by me and considered in my medical decision making (see chart for details).    MDM Rules/Calculators/A&P                          29 year old female with complaints of left flank pain which began this morning On presentation, she is alert, oriented, slightly uncomfortable appearing, however nontoxic-appearing, no acute distress, speaking full sentences without increased work of breathing, nondiaphoretic.  Vitals overall reassuring, she is afebrile, blood pressure slightly elevated 139/105 likely in the setting of pain.  Denies any chest pain or shortness of breath, no evidence of hypertensive urgency/emergency.  Physical exam with left-sided flank pain and very mild right-sided flank pain.  Abdomen soft and nontender.  Patient is denying any vaginal symptoms at this time, and has a frequent history of UTIs and kidney stones.  I do not think she needs a pelvic exam at this time.  BMP without electrolyte abnormalities, normal renal function.  CBC without leukocytosis.  UA with positive nitrates, large leukocytes, many WBCs and bacteria.  Likely consistent with UTI.  Culture pending.  Pregnancy negative.  CT renal study with out evidence of obstructing calculi.   MDM: Suspect that the patient's pain is likely due to pyelonephritis with possibly some renal colic.  Will treat pain with Toradol, if the patient's pain control is adequate, I suspect that she will be stable with home antibiotic regimen as there is no evidence of sepsis on her lab work or vitals at this time.  Care signed out to [facility] PA-C who will re-evaluate the patient's pain control and likely discharge home with antibiotics and pain medication.    Final Clinical Impression(s) / ED Diagnoses Final diagnoses:  Pyelonephritis  Left renal stone    Rx / DC Orders ED Discharge Orders         Ordered  sulfamethoxazole-trimethoprim (BACTRIM DS) 800-160 MG tablet  2 times daily        07/16/20 1507    ibuprofen (ADVIL) 800 MG tablet  3 times daily        07/16/20 1508           Leone BrandBelaya, Stancil Deisher A, PA-C 07/16/20 1512    Milagros Lollykstra, Richard S, MD 07/17/20 2140

## 2020-07-16 NOTE — ED Provider Notes (Signed)
Care assumed from Trudee Grip, PA-C at shift change with re-evaluation pending.   In brief, this patient is a 29 y.o. F who presents for evaluation of left flank pain and urinary discomfort that began this morning.  History of kidney stones and UTIs.  No vomiting, fever, chills.  Please see note from previous provider for full history/physical exam.   Physical Exam  BP 123/83   Pulse 60   Temp 98 F (36.7 C) (Oral)   Resp 18   Ht 5\' 3"  (1.6 m)   Wt 86.2 kg   LMP 07/02/2020   SpO2 99%   BMI 33.66 kg/m   Physical Exam   Abdomen is soft, non-distended. No rigidity, guarding.   ED Course/Procedures     Procedures  MDM  PLAN: Patient received Toradol.  Will reassess.  Plan to discharge with antibiotics.  MDM:  Re-evaluation. Patient resting comfortably in bed. She reports improvement in pain after Toradol in the ED, though still has some slight discomfort. Abdominal exam is benign. She is not having any nausea/vomiting. Patient stable for discharge. She recently moved here from 07/04/2020 and has not established with a Urologist yet. Will give her outpatient Urology referral. Patient discharged with bactrim and ibuprofen per previous provider. At this time, patient exhibits no emergent life-threatening condition that require further evaluation in ED. Patient had ample opportunity for questions and discussion. All patient's questions were answered with full understanding. Strict return precautions discussed. Patient expresses understanding and agreement to plan.    1. Pyelonephritis   2. Left renal stone    Portions of this note were generated with Dragon dictation software. Dictation errors may occur despite best attempts at proofreading.      [place], PA-C 07/16/20 1709    09/15/20, MD 07/18/20 [phone]

## 2020-07-18 LAB — URINE CULTURE: Culture: >=100000 — AB

## 2020-07-19 ENCOUNTER — Telehealth: Payer: Self-pay | Admitting: Emergency Medicine

## 2020-07-19 NOTE — Telephone Encounter (Signed)
Post ED Visit - Positive Culture [name]  Culture report reviewed by antimicrobial stewardship pharmacist: [facility] []  , Pharm.D. []  Enzo Bi, Pharm.D., BCPS AQ-ID []  , Pharm.D., BCPS []  Celedonio Miyamoto, .D., BCPS []  [place], .D., BCPS, AAHIVP []  Georgina Pillion, Pharm.D., BCPS, AAHIVP []  [address], PharmD, BCPS []  , PharmD, BCPS []  [place], PharmD, BCPS []  [address], PharmD []  , PharmD, BCPS []  Estella Husk, PharmD  Pharmacy Team []  Lysle Pearl, PharmD []  , PharmD []  Phillips Climes, PharmD [x]  , Rph []  Agapito Games) , PharmD []  Verlan Friends, PharmD []  , PharmD []  Mervyn Gay, PharmD []  , PharmD []  Vinnie Level, PharmD []  [facility], PharmD []  , PharmD []  Len Childs, PharmD   Positive urine culture Treated with sulfamethoxazole-trimethoprim, organism sensitive to the same and no further patient follow-up is required at this time.  07/19/2020, 10:43 AM

## 2020-09-10 DIAGNOSIS — R198 Other specified symptoms and signs involving the digestive system and abdomen: Secondary | ICD-10-CM | POA: Diagnosis not present

## 2020-09-10 DIAGNOSIS — N898 Other specified noninflammatory disorders of vagina: Secondary | ICD-10-CM | POA: Diagnosis not present

## 2020-09-10 DIAGNOSIS — R300 Dysuria: Secondary | ICD-10-CM | POA: Diagnosis not present

## 2020-09-10 DIAGNOSIS — N9489 Other specified conditions associated with female genital organs and menstrual cycle: Secondary | ICD-10-CM | POA: Diagnosis not present

## 2020-09-16 DIAGNOSIS — H00012 Hordeolum externum right lower eyelid: Secondary | ICD-10-CM | POA: Diagnosis not present

## 2020-10-11 DIAGNOSIS — Z87442 Personal history of urinary calculi: Secondary | ICD-10-CM | POA: Diagnosis not present

## 2020-10-11 DIAGNOSIS — F3131 Bipolar disorder, current episode depressed, mild: Secondary | ICD-10-CM | POA: Diagnosis not present

## 2020-10-12 ENCOUNTER — Encounter (HOSPITAL_COMMUNITY): Payer: Self-pay | Admitting: Obstetrics & Gynecology

## 2020-10-12 ENCOUNTER — Inpatient Hospital Stay (HOSPITAL_COMMUNITY): Payer: 59

## 2020-10-12 ENCOUNTER — Other Ambulatory Visit: Payer: Self-pay

## 2020-10-12 ENCOUNTER — Inpatient Hospital Stay (HOSPITAL_COMMUNITY)
Admission: AD | Admit: 2020-10-12 | Discharge: 2020-10-12 | Disposition: A | Discharge status: Home / Self Care | Payer: 59 | Attending: Obstetrics & Gynecology | Admitting: Obstetrics & Gynecology

## 2020-10-12 DIAGNOSIS — Z3490 Encounter for supervision of normal pregnancy, unspecified, unspecified trimester: Secondary | ICD-10-CM

## 2020-10-12 DIAGNOSIS — F319 Bipolar disorder, unspecified: Secondary | ICD-10-CM | POA: Diagnosis not present

## 2020-10-12 DIAGNOSIS — Z791 Long term (current) use of non-steroidal anti-inflammatories (NSAID): Secondary | ICD-10-CM | POA: Diagnosis not present

## 2020-10-12 DIAGNOSIS — Z79899 Other long term (current) drug therapy: Secondary | ICD-10-CM | POA: Insufficient documentation

## 2020-10-12 DIAGNOSIS — Z3A01 Less than 8 weeks gestation of pregnancy: Secondary | ICD-10-CM | POA: Insufficient documentation

## 2020-10-12 DIAGNOSIS — R102 Pelvic and perineal pain: Secondary | ICD-10-CM | POA: Diagnosis not present

## 2020-10-12 DIAGNOSIS — O26891 Other specified pregnancy related conditions, first trimester: Secondary | ICD-10-CM | POA: Insufficient documentation

## 2020-10-12 DIAGNOSIS — O99341 Other mental disorders complicating pregnancy, first trimester: Secondary | ICD-10-CM | POA: Diagnosis not present

## 2020-10-12 DIAGNOSIS — O26899 Other specified pregnancy related conditions, unspecified trimester: Secondary | ICD-10-CM

## 2020-10-12 LAB — CBC
HCT: 35.8 % — ABNORMAL LOW (ref 36.0–46.0)
Hemoglobin: 11.7 g/dL — ABNORMAL LOW (ref 12.0–15.0)
MCH: 28.6 pg (ref 26.0–34.0)
MCHC: 32.7 g/dL (ref 30.0–36.0)
MCV: 87.5 fL (ref 80.0–100.0)
Platelets: 312 10*3/uL (ref 150–400)
RBC: 4.09 MIL/uL (ref 3.87–5.11)
RDW: 13.5 % (ref 11.5–15.5)
WBC: 9.3 10*3/uL (ref 4.0–10.5)
nRBC: 0 % (ref 0.0–0.2)

## 2020-10-12 LAB — COMPREHENSIVE METABOLIC PANEL
ALT: 13 U/L (ref 0–44)
AST: 14 U/L — ABNORMAL LOW (ref 15–41)
Albumin: 3.5 g/dL (ref 3.5–5.0)
Alkaline Phosphatase: 49 U/L (ref 38–126)
Anion gap: 10 (ref 5–15)
BUN: 11 mg/dL (ref 6–20)
CO2: 24 mmol/L (ref 22–32)
Calcium: 8.9 mg/dL (ref 8.9–10.3)
Chloride: 104 mmol/L (ref 98–111)
Creatinine, Ser: 0.83 mg/dL (ref 0.44–1.00)
GFR, Estimated: >60 mL/min (ref >60)
Glucose, Bld: 94 mg/dL (ref 70–99)
Potassium: 3.9 mmol/L (ref 3.5–5.1)
Sodium: 138 mmol/L (ref 135–145)
Total Bilirubin: 0.4 mg/dL (ref 0.3–1.2)
Total Protein: 6.5 g/dL (ref 6.5–8.1)

## 2020-10-12 LAB — URINALYSIS, ROUTINE W REFLEX MICROSCOPIC
Bilirubin Urine: NEGATIVE
Glucose, UA: NEGATIVE mg/dL
Hgb urine dipstick: NEGATIVE
Ketones, ur: NEGATIVE mg/dL
Nitrite: NEGATIVE
Protein, ur: NEGATIVE mg/dL
Specific Gravity, Urine: 1.01 (ref 1.005–1.030)
pH: 8 (ref 5.0–8.0)

## 2020-10-12 LAB — WET PREP, GENITAL
Sperm: NONE SEEN
Trich, Wet Prep: NONE SEEN
Yeast Wet Prep HPF POC: NONE SEEN

## 2020-10-12 LAB — HCG, QUANTITATIVE, PREGNANCY: hCG, Beta Chain, Quant, S: 21434 m[IU]/mL — ABNORMAL HIGH (ref <5)

## 2020-10-12 LAB — POCT PREGNANCY, URINE: Preg Test, Ur: POSITIVE — AB

## 2020-10-12 NOTE — Discharge Instructions (Signed)
Safe Medications in Pregnancy    Acne: Benzoyl Peroxide Salicylic Acid  Backache/Headache: Tylenol: 2 regular strength every 4 hours OR              2 Extra strength every 6 hours  Colds/Coughs/Allergies: Benadryl (alcohol free) 25 mg every 6 hours as needed Breath right strips Claritin Cepacol throat lozenges Chloraseptic throat spray Cold-Eeze- up to three times per day Cough drops, alcohol free Flonase (by prescription only) Guaifenesin Mucinex Robitussin DM (plain only, alcohol free) Saline nasal spray/drops Sudafed (pseudoephedrine) & Actifed ** use only after [redacted] weeks gestation and if you do not have high blood pressure Tylenol Vicks Vaporub Zinc lozenges Zyrtec   Constipation: Colace Ducolax suppositories Fleet enema Glycerin suppositories Metamucil Milk of magnesia Miralax Senokot Smooth move tea  Diarrhea: Kaopectate Imodium A-D  *NO pepto Bismol  Hemorrhoids: Anusol Anusol HC Preparation H Tucks  Indigestion: Tums Maalox Mylanta Zantac  Pepcid  Insomnia: Benadryl (alcohol free) 25mg  every 6 hours as needed Tylenol PM Unisom, no Gelcaps  Leg Cramps: Tums MagGel  Nausea/Vomiting:  Bonine Dramamine Emetrol Ginger extract Sea bands Meclizine  Nausea medication to take during pregnancy:  Unisom (doxylamine succinate 25 mg tablets) Take one tablet daily at bedtime. If symptoms are not adequately controlled, the dose can be increased to a maximum recommended dose of two tablets daily (1/2 tablet in the morning, 1/2 tablet mid-afternoon and one at bedtime). Vitamin B6 100mg  tablets. Take one tablet twice a day (up to 200 mg per day).  Skin Rashes: Aveeno products Benadryl cream or 25mg  every 6 hours as needed Calamine Lotion 1% cortisone cream  Yeast infection: Gyne-lotrimin 7 Monistat 7   **If taking multiple medications, please check labels to avoid duplicating the same active ingredients **take  medication as directed on the label ** Do not exceed 4000 mg of tylenol in 24 hours **Do not take medications that contain aspirin or ibuprofen          Prenatal Care Providers           [facility] @ [facility] - accepts patients without insurance  Phone: [phone]  Center for @ [facility]   Phone: [phone]  [facility] @[place]       Phone: [phone]            [facility] for [facility] @ [place]     Phone: [phone]          Center for [id number] @ [facility]   Phone: [phone]  [facility] @ [facility] - accepts patients without insurance  Phone: [phone]  [facility] for [facility] @ [facility] Phone: [phone]     [facility] Department - accepts patients without insurance Phone: [phone]  [facility]  Phone: [phone]  [facility] Phone: [phone]  [facility] Phone: [phone]  [facility] Phone: [phone]  [facility] Phone: [phone]  [facility]  Phone: [phone]         First Trimester of Pregnancy The first trimester of pregnancy is from week 1 until the end of week 13 (months 1 through 3). A week after a sperm fertilizes an egg, the egg will implant on the wall of the uterus. This embryo will begin to develop into a baby. Genes from you and your partner will form the baby. The female genes will determine whether the baby will be a boy or a girl. At 6-8  weeks, the eyes and face will be formed, and the heartbeat can be seen on ultrasound. At the end of 12 weeks, all the baby's organs will be formed. Now that you are pregnant, you will want to do everything you can to have a healthy baby. Two of the most important things are to get good prenatal care and to follow your health care provider's instructions. Prenatal care is all the  medical care you receive before the baby's birth. This care will help prevent, find, and treat any problems during the pregnancy and childbirth. Body changes during your first trimester Your body goes through many changes during pregnancy. The changes vary from woman to woman.  You may gain or lose a couple of pounds at first.  You may feel sick to your stomach (nauseous) and you may throw up (vomit). If the vomiting is uncontrollable, call your health care provider.  You may tire easily.  You may develop headaches that can be relieved by medicines. All medicines should be approved by your health care provider.  You may urinate more often. Painful urination may mean you have a bladder infection.  You may develop heartburn as a result of your pregnancy.  You may develop constipation because certain hormones are causing the muscles that push stool through your intestines to slow down.  You may develop hemorrhoids or swollen veins (varicose veins).  Your breasts may begin to grow larger and become tender. Your nipples may stick out more, and the tissue that surrounds them (areola) may become darker.  Your gums may bleed and may be sensitive to brushing and flossing.  Dark spots or blotches (chloasma, mask of pregnancy) may develop on your face. This will likely fade after the baby is born.  Your menstrual periods will stop.  You may have a loss of appetite.  You may develop cravings for certain kinds of food.  You may have changes in your emotions from day to day, such as being excited to be pregnant or being concerned that something may go wrong with the pregnancy and baby.  You may have more vivid and strange dreams.  You may have changes in your hair. These can include thickening of your hair, rapid growth, and changes in texture. Some women also have hair loss during or after pregnancy, or hair that feels dry or thin. Your hair will most likely return to normal after your baby is  born. What to expect at prenatal visits During a routine prenatal visit:  You will be weighed to make sure you and the baby are growing normally.  Your blood pressure will be taken.  Your abdomen will be measured to track your baby's growth.  The fetal heartbeat will be listened to between weeks 10 and 14 of your pregnancy.  Test results from any previous visits will be discussed. Your health care provider may ask you:  How you are feeling.  If you are feeling the baby move.  If you have had any abnormal symptoms, such as leaking fluid, bleeding, severe headaches, or abdominal cramping.  If you are using any tobacco products, including cigarettes, chewing tobacco, and electronic cigarettes.  If you have any questions. Other tests that may be performed during your first trimester include:  Blood tests to find your blood type and to check for the presence of any previous infections. The tests will also be used to check for low iron levels (anemia) and protein on red blood cells (Rh antibodies). Depending on your risk  factors, or if you previously had diabetes during pregnancy, you may have tests to check for high blood sugar that affects pregnant women (gestational diabetes).  Urine tests to check for infections, diabetes, or protein in the urine.  An ultrasound to confirm the proper growth and development of the baby.  Fetal screens for spinal cord problems (spina bifida) and Down syndrome.  HIV (human immunodeficiency virus) testing. Routine prenatal testing includes screening for HIV, unless you choose not to have this test.  You may need other tests to make sure you and the baby are doing well. Follow these instructions at home: Medicines  Follow your health care provider's instructions regarding medicine use. Specific medicines may be either safe or unsafe to take during pregnancy.  Take a prenatal vitamin that contains at least 600 micrograms (mcg) of folic acid.  If  you develop constipation, try taking a stool softener if your health care provider approves. Eating and drinking   Eat a balanced diet that includes fresh fruits and vegetables, whole grains, good sources of protein such as meat, eggs, or tofu, and low-fat dairy. Your health care provider will help you determine the amount of weight gain that is right for you.  Avoid raw meat and uncooked cheese. These carry germs that can cause birth defects in the baby.  Eating four or five small meals rather than three large meals a day may help relieve nausea and vomiting. If you start to feel nauseous, eating a few soda crackers can be helpful. Drinking liquids between meals, instead of during meals, also seems to help ease nausea and vomiting.  Limit foods that are high in fat and processed sugars, such as fried and sweet foods.  To prevent constipation: ? Eat foods that are high in fiber, such as fresh fruits and vegetables, whole grains, and beans. ? Drink enough fluid to keep your urine clear or pale yellow. Activity  Exercise only as directed by your health care provider. Most women can continue their usual exercise routine during pregnancy. Try to exercise for 30 minutes at least 5 days a week. Exercising will help you: ? Control your weight. ? Stay in shape. ? Be prepared for labor and delivery.  Experiencing pain or cramping in the lower abdomen or lower back is a good sign that you should stop exercising. Check with your health care provider before continuing with normal exercises.  Try to avoid standing for long periods of time. Move your legs often if you must stand in one place for a long time.  Avoid heavy lifting.  Wear low-heeled shoes and practice good posture.  You may continue to have sex unless your health care provider tells you not to. Relieving pain and discomfort  Wear a good support bra to relieve breast tenderness.  Take warm sitz baths to soothe any pain or discomfort  caused by hemorrhoids. Use hemorrhoid cream if your health care provider approves.  Rest with your legs elevated if you have leg cramps or low back pain.  If you develop varicose veins in your legs, wear support hose. Elevate your feet for 15 minutes, 3-4 times a day. Limit salt in your diet. Prenatal care  Schedule your prenatal visits by the twelfth week of pregnancy. They are usually scheduled monthly at first, then more often in the last 2 months before delivery.  Write down your questions. Take them to your prenatal visits.  Keep all your prenatal visits as told by your health care provider. This is  important. Safety  Wear your seat belt at all times when driving.  Make a list of emergency phone numbers, including numbers for family, friends, the hospital, and police and fire departments. General instructions  Ask your health care provider for a referral to a local prenatal education class. Begin classes no later than the beginning of month 6 of your pregnancy.  Ask for help if you have counseling or nutritional needs during pregnancy. Your health care provider can offer advice or refer you to specialists for help with various needs.  Do not use hot tubs, steam rooms, or saunas.  Do not douche or use tampons or scented sanitary pads.  Do not cross your legs for long periods of time.  Avoid cat litter boxes and soil used by cats. These carry germs that can cause birth defects in the baby and possibly loss of the fetus by miscarriage or stillbirth.  Avoid all smoking, herbs, alcohol, and medicines not prescribed by your health care provider. Chemicals in these products affect the formation and growth of the baby.  Do not use any products that contain nicotine or tobacco, such as cigarettes and e-cigarettes. If you need help quitting, ask your health care provider. You may receive counseling support and other resources to help you quit.  Schedule a dentist appointment. At home,  brush your teeth with a soft toothbrush and be gentle when you floss. Contact a health care provider if:  You have dizziness.  You have mild pelvic cramps, pelvic pressure, or nagging pain in the abdominal area.  You have persistent nausea, vomiting, or diarrhea.  You have a bad smelling vaginal discharge.  You have pain when you urinate.  You notice increased swelling in your face, hands, legs, or ankles.  You are exposed to fifth disease or chickenpox.  You are exposed to German measles (rubella) and have never had it. Get help right away if:  You have a fever.  You are leaking fluid from your vagina.  You have spotting or bleeding from your vagina.  You have severe abdominal cramping or pain.  You have rapid weight gain or loss.  You vomit blood or material that looks like coffee grounds.  You develop a severe headache.  You have shortness of breath.  You have any kind of trauma, such as from a fall or a car accident. Summary  The first trimester of pregnancy is from week 1 until the end of week 13 (months 1 through 3).  Your body goes through many changes during pregnancy. The changes vary from woman to woman.  You will have routine prenatal visits. During those visits, your health care provider will examine you, discuss any test results you may have, and talk with you about how you are feeling. This information is not intended to replace advice given to you by your health care provider. Make sure you discuss any questions you have with your health care provider. Document Revised: 10/08/2017 Document Reviewed: 10/07/2016 Elsevier Patient Education  2020 Elsevier Inc.  

## 2020-10-12 NOTE — MAU Note (Signed)
[name] is a 29 y.o. at [redacted]w[redacted]d here in MAU reporting: has been having intermittent left lower cramping. Denies bleeding and discharge.  LMP: 09/01/20  Onset of complaint: a week  Pain score: 6/10  Vitals:   10/12/20 1840  BP: 119/67  Pulse: 77  Resp: 16  Temp: 98.8 F (37.1 C)  SpO2: 99%     Lab orders placed from triage: UA UPT

## 2020-10-12 NOTE — MAU Provider Note (Signed)
History     CSN: [id number]  Arrival date and time: 10/12/20 1813   First Provider Initiated Contact with Patient 10/12/20 1926      Chief Complaint  Patient presents with  . Abdominal Pain   [name] is a 29 y.o. [phone] at [redacted]w[redacted]d who presents to MAU for pelvic cramping that is intermittent and mild. Patient reports cramping has been going on for about one week.   Pt denies VB, vaginal discharge/odor/itching. Pt denies N/V, abdominal pain, constipation, diarrhea, or urinary problems. Pt denies fever, chills, fatigue, sweating or changes in appetite. Pt denies SOB or chest pain. Pt denies dizziness, HA, light-headedness, weakness.   OB History    Gravida  5   Para  3   Term  3   Preterm      AB  1   Living  3     SAB  1   TAB      Ectopic      Multiple  0   Live Births  3           Past Medical History:  Diagnosis Date  . Bipolar 1 disorder (HCC)   . Bipolar 1 disorder, depressed (HCC)    currently stable, no meds, no problems  . Depression   . Headache   . Infection    frequent UTI's    Past Surgical History:  Procedure Laterality Date  . CYST REMOVAL HAND    . FRACTURE SURGERY Left    leg    Family History  Problem Relation Age of Onset  . Birth defects Paternal Aunt        spina bifida  . Hypertension Paternal Grandmother     Social History   Tobacco Use  . Smoking status: Never Smoker  . Smokeless tobacco: Never Used  Substance Use Topics  . Alcohol use: No    Comment: occasional,, not now  . Drug use: No    Allergies: No Active Allergies  Medications Prior to Admission  Medication Sig Dispense Refill Last Dose  . acetaminophen (TYLENOL) 500 MG tablet Take 1 tablet (500 mg total) by mouth every 6 (six) hours as needed. (Patient not taking: Reported on 07/16/2020) 30 tablet 0   . cyclobenzaprine (FLEXERIL) 10 MG tablet Take 1 tablet (10 mg total) by mouth 2 (two) times daily as needed for muscle spasms. (Patient  not taking: Reported on 07/16/2020) 10 tablet 0   . docusate sodium (COLACE) 100 MG capsule Take 1 capsule (100 mg total) by mouth 2 (two) times daily. (Patient not taking: Reported on 07/16/2020) 60 capsule 0   . ibuprofen (ADVIL) 800 MG tablet Take 1 tablet (800 mg total) by mouth 3 (three) times daily. 21 tablet 0   . oxyCODONE-acetaminophen (ROXICET) 5-325 MG tablet Take 1-2 tablets by mouth every 4 (four) hours as needed for severe pain. (Patient not taking: Reported on 07/16/2020) 16 tablet 0     Review of Systems  Constitutional: Negative for chills, diaphoresis, fatigue and fever.  Eyes: Negative for visual disturbance.  Respiratory: Negative for shortness of breath.   Cardiovascular: Negative for chest pain.  Gastrointestinal: Negative for abdominal pain, constipation, diarrhea, nausea and vomiting.  Genitourinary: Positive for pelvic pain. Negative for dysuria, flank pain, frequency, urgency, vaginal bleeding and vaginal discharge.  Neurological: Negative for dizziness, weakness, light-headedness and headaches.   Physical Exam   Blood pressure 119/67, pulse 77, temperature 98.8 F (37.1 C), temperature source Oral, resp. rate 16, height  5\' 3"  (1.6 m), weight 93.3 kg, last menstrual period 09/01/2020, SpO2 99 %, unknown if currently breastfeeding.  Patient Vitals for the past 24 hrs:  BP Temp Temp src Pulse Resp SpO2 Height Weight  10/12/20 1840 119/67 98.8 F (37.1 C) Oral 77 16 99 % 5\' 3"  (1.6 m) 93.3 kg   Physical Exam Vitals and nursing note reviewed.  Constitutional:      General: She is not in acute distress.    Appearance: Normal appearance. She is not ill-appearing, toxic-appearing or diaphoretic.  HENT:     Head: Normocephalic and atraumatic.  Pulmonary:     Effort: Pulmonary effort is normal.  Neurological:     Mental Status: She is alert and oriented to person, place, and time.  Psychiatric:        Mood and Affect: Mood normal.        Behavior: Behavior normal.         Thought Content: Thought content normal.        Judgment: Judgment normal.    Results for orders placed or performed during the hospital encounter of 10/12/20 (from the past 24 hour(s))  Urinalysis, Routine w reflex microscopic     Status: Abnormal   Collection Time: 10/12/20  6:35 PM  Result Value Ref Range   Color, Urine STRAW (A) YELLOW   APPearance HAZY (A) CLEAR   Specific Gravity, Urine 1.010 1.005 - 1.030   pH 8.0 5.0 - 8.0   Glucose, UA NEGATIVE NEGATIVE mg/dL   Hgb urine dipstick NEGATIVE NEGATIVE   Bilirubin Urine NEGATIVE NEGATIVE   Ketones, ur NEGATIVE NEGATIVE mg/dL   Protein, ur NEGATIVE NEGATIVE mg/dL   Nitrite NEGATIVE NEGATIVE   Leukocytes,Ua SMALL (A) NEGATIVE   RBC / HPF 0-5 0 - 5 RBC/hpf   WBC, UA 6-10 0 - 5 WBC/hpf   Bacteria, UA RARE (A) NONE SEEN   Squamous Epithelial / LPF 6-10 0 - 5  Pregnancy, urine POC     Status: Abnormal   Collection Time: 10/12/20  6:36 PM  Result Value Ref Range   Preg Test, Ur POSITIVE (A) NEGATIVE  Wet prep, genital     Status: Abnormal   Collection Time: 10/12/20  7:32 PM  Result Value Ref Range   Yeast Wet Prep HPF POC NONE SEEN NONE SEEN   Trich, Wet Prep NONE SEEN NONE SEEN   Clue Cells Wet Prep HPF POC PRESENT (A) NONE SEEN   WBC, Wet Prep HPF POC FEW (A) NONE SEEN   Sperm NONE SEEN   CBC     Status: Abnormal   Collection Time: 10/12/20  7:44 PM  Result Value Ref Range   WBC 9.3 4.0 - 10.5 K/uL   RBC 4.09 3.87 - 5.11 MIL/uL   Hemoglobin 11.7 (L) 12.0 - 15.0 g/dL   HCT 04.535.8 (L) 36 - 46 %   MCV 87.5 80.0 - 100.0 fL   MCH 28.6 26.0 - 34.0 pg   MCHC 32.7 30.0 - 36.0 g/dL   RDW 40.913.5 81.111.5 - 91.415.5 %   Platelets 312 150 - 400 K/uL   nRBC 0.0 0.0 - 0.2 %   US OB LESS THAN 14 WEEKS WITH OB TRANSVAGINAL  Result Date: 10/12/2020 CLINICAL DATA:  Pelvic cramping, left lower quadrant, back, quantitative hCG pending EXAM: OBSTETRIC <14 WK US AND TRANSVAGINAL OB US TECHNIQUE: Both transabdominal and transvaginal ultrasound  examinations were performed for complete evaluation of the gestation as well as the maternal uterus, adnexal regions, and  pelvic cul-de-sac. Transvaginal technique was performed to assess early pregnancy. COMPARISON:  CT 07/16/2020 FINDINGS: Intrauterine gestational sac: Single Yolk sac:  Visualized. Embryo:  Visualized. Cardiac Activity: Visualized. Heart Rate: 90 bpm CRL:  2.4 mm   5 w   5 d                  [place] EDC: 06/09/2021 Subchorionic hemorrhage:  None visualized. Maternal uterus/adnexae: Normal anteverted maternal uterus. No concerning adnexal lesions. No free fluid. IMPRESSION: Single intrauterine gestation of 5 weeks, 5 days by crown-rump length measurement. Heart rate of 90 beats per minute can be related to early gestation, typically accelerating over the first several weeks following detection. If there is clinical concern, consider short-term follow-up to ensure continued viability. Electronically Signed   By: Kreg Shropshire M.D.   On: 10/12/2020 20:27    MAU Course  Procedures  MDM -r/o ectopic -UA: straw/hazy/sm leuks/rare bacteria -CBC: WNL -CMP: pending at time of discharge -[place]: single IUP, [redacted]w[redacted]d, FHR 90 -hCG: pending at time of discharge -ABO: O Positive -WetPrep: +ClueCells (isolated finding not requiring treatment) -GC/CT collected -pt discharged to home in stable condition  Orders Placed This Encounter  Procedures  . Wet prep, genital    Standing Status:   Standing    Number of Occurrences:   1  . US OB LESS THAN 14 WEEKS WITH OB TRANSVAGINAL    Standing Status:   Standing    Number of Occurrences:   1    Order Specific Question:   Symptom/Reason for Exam    Answer:   Pelvic cramping in antepartum period [[id number]]  . US OB LESS THAN 14 WEEKS WITH OB TRANSVAGINAL    Standing Status:   Future    Standing Expiration Date:   10/12/2021    Scheduling Instructions:     Please schedule patient for follow-up US, Monday - Thursday between 8:00 am - 10:00 am and 12:00 pm - 3:00  pm. The patient will follow-up with [facility] immediately after [place] for results.    Order Specific Question:   Reason for Exam (SYMPTOM  OR DIAGNOSIS REQUIRED)    Answer:   viability    Order Specific Question:   Preferred Imaging Location?    Answer:   [facility]  . Urinalysis, Routine w reflex microscopic Urine, Clean Catch    Standing Status:   Standing    Number of Occurrences:   1  . CBC    Standing Status:   Standing    Number of Occurrences:   1  . Comprehensive metabolic panel    Standing Status:   Standing    Number of Occurrences:   1  . hCG, quantitative, pregnancy    Standing Status:   Standing    Number of Occurrences:   1  . Pregnancy, urine POC    Standing Status:   Standing    Number of Occurrences:   1  . Discharge patient    Order Specific Question:   Discharge disposition    Answer:   01-Home or Self Care [1]    Order Specific Question:   Discharge patient date    Answer:   10/12/2020   No orders of the defined types were placed in this encounter.  Assessment and Plan   1. Intrauterine pregnancy   2. Pelvic cramping in antepartum period   3. [redacted] weeks gestation of pregnancy     Allergies as of 10/12/2020   No Active Allergies  Medication List    STOP taking these medications   ibuprofen 800 MG tablet Commonly known as: ADVIL     TAKE these medications   acetaminophen 500 MG tablet Commonly known as: TYLENOL Take 1 tablet (500 mg total) by mouth every 6 (six) hours as needed.   cyclobenzaprine 10 MG tablet Commonly known as: FLEXERIL Take 1 tablet (10 mg total) by mouth 2 (two) times daily as needed for muscle spasms.   docusate sodium 100 MG capsule Commonly known as: Colace Take 1 capsule (100 mg total) by mouth 2 (two) times daily.   oxyCODONE-acetaminophen 5-325 MG tablet Commonly known as: Roxicet Take 1-2 tablets by mouth every 4 (four) hours as needed for severe pain.       -will call with culture results, if positive -f/u  US in 10 days for viability -safe meds in pregnancy list given -OB provider list given -return MAU precautions given -pt discharged to home in stable condition  Joni Reining E Josephine Rudnick 10/12/2020, 8:48 PM

## 2020-10-14 LAB — GC/CHLAMYDIA PROBE AMP (~~LOC~~) NOT AT ARMC
Chlamydia: NEGATIVE
Comment: NEGATIVE
Comment: NORMAL
Neisseria Gonorrhea: NEGATIVE

## 2020-10-21 ENCOUNTER — Other Ambulatory Visit: Payer: Self-pay

## 2020-10-21 ENCOUNTER — Ambulatory Visit
Admission: RE | Admit: 2020-10-21 | Discharge: 2020-10-21 | Disposition: A | Discharge status: Home / Self Care | Payer: 59 | Source: Ambulatory Visit | Attending: Women's Health | Admitting: Women's Health

## 2020-10-21 ENCOUNTER — Telehealth: Payer: Self-pay | Admitting: Medical

## 2020-10-21 DIAGNOSIS — R102 Pelvic and perineal pain: Secondary | ICD-10-CM | POA: Diagnosis not present

## 2020-10-21 DIAGNOSIS — Z3490 Encounter for supervision of normal pregnancy, unspecified, unspecified trimester: Secondary | ICD-10-CM | POA: Insufficient documentation

## 2020-10-21 DIAGNOSIS — Z3A01 Less than 8 weeks gestation of pregnancy: Secondary | ICD-10-CM | POA: Insufficient documentation

## 2020-10-21 DIAGNOSIS — O26899 Other specified pregnancy related conditions, unspecified trimester: Secondary | ICD-10-CM | POA: Diagnosis not present

## 2020-10-21 DIAGNOSIS — O3680X0 Pregnancy with inconclusive fetal viability, not applicable or unspecified: Secondary | ICD-10-CM | POA: Diagnosis not present

## 2020-10-21 NOTE — Telephone Encounter (Signed)
I called Peggy Mckenzie today at 2:16 PM and confirmed patient's identity using two patient identifiers. [place] results from earlier today were reviewed. Patient is scheduled for new OB visit at [facility] on 10/23/20. First trimester warning signs reviewed. Patient voiced understanding and had no further questions.   US OB Transvaginal  Result Date: 10/21/2020 CLINICAL DATA:  Pregnancy.  Assess viability EXAM: TRANSVAGINAL OB ULTRASOUND TECHNIQUE: Transvaginal ultrasound was performed for complete evaluation of the gestation as well as the maternal uterus, adnexal regions, and pelvic cul-de-sac. COMPARISON:  10/12/2020 FINDINGS: Intrauterine gestational sac: Single Yolk sac:  Visualized. Embryo:  Visualized. Cardiac Activity: Visualized. Heart Rate: 132 bpm CRL:   10.2 mm   7 w 1 d                  [place] EDC: 06/08/2021 Subchorionic hemorrhage:  None visualized. Maternal uterus/adnexae: Right ovarian corpus luteal cyst. Ovaries are otherwise unremarkable. No free fluid within the pelvis. IMPRESSION: 1. Single live intrauterine gestation measuring 7 weeks 1 day by crown-rump length. 2. Active embryonic heart tones detected at 132 bpm. Electronically Signed   By: Duanne Guess D.O.   On: 10/21/2020 13:27    Peggy Nipple, PA-C 10/21/2020 2:16 PM

## 2020-10-23 ENCOUNTER — Other Ambulatory Visit (HOSPITAL_COMMUNITY): Payer: Self-pay | Admitting: Obstetrics & Gynecology

## 2020-10-23 DIAGNOSIS — Z01411 Encounter for gynecological examination (general) (routine) with abnormal findings: Secondary | ICD-10-CM | POA: Diagnosis not present

## 2020-10-23 DIAGNOSIS — R87810 Cervical high risk human papillomavirus (HPV) DNA test positive: Secondary | ICD-10-CM | POA: Diagnosis not present

## 2020-10-23 DIAGNOSIS — Z113 Encounter for screening for infections with a predominantly sexual mode of transmission: Secondary | ICD-10-CM | POA: Diagnosis not present

## 2020-10-23 DIAGNOSIS — Z3A01 Less than 8 weeks gestation of pregnancy: Secondary | ICD-10-CM | POA: Diagnosis not present

## 2020-10-23 DIAGNOSIS — Z6836 Body mass index (BMI) 36.0-36.9, adult: Secondary | ICD-10-CM | POA: Diagnosis not present

## 2020-10-23 DIAGNOSIS — Z124 Encounter for screening for malignant neoplasm of cervix: Secondary | ICD-10-CM | POA: Diagnosis not present

## 2020-10-23 DIAGNOSIS — F3131 Bipolar disorder, current episode depressed, mild: Secondary | ICD-10-CM | POA: Diagnosis not present

## 2020-10-23 DIAGNOSIS — N925 Other specified irregular menstruation: Secondary | ICD-10-CM | POA: Diagnosis not present

## 2020-10-23 DIAGNOSIS — O3680X9 Pregnancy with inconclusive fetal viability, other fetus: Secondary | ICD-10-CM | POA: Diagnosis not present

## 2020-10-23 DIAGNOSIS — R309 Painful micturition, unspecified: Secondary | ICD-10-CM | POA: Diagnosis not present

## 2020-10-23 DIAGNOSIS — N390 Urinary tract infection, site not specified: Secondary | ICD-10-CM | POA: Diagnosis not present

## 2020-10-23 DIAGNOSIS — A609 Anogenital herpesviral infection, unspecified: Secondary | ICD-10-CM | POA: Diagnosis not present

## 2020-10-23 LAB — OB RESULTS CONSOLE RPR: RPR: NONREACTIVE

## 2020-10-23 LAB — OB RESULTS CONSOLE HEPATITIS B SURFACE ANTIGEN: Hepatitis B Surface Ag: NEGATIVE

## 2020-10-23 LAB — OB RESULTS CONSOLE HIV ANTIBODY (ROUTINE TESTING): HIV: NONREACTIVE

## 2020-10-23 LAB — OB RESULTS CONSOLE RUBELLA ANTIBODY, IGM: Rubella: IMMUNE

## 2020-11-05 ENCOUNTER — Other Ambulatory Visit: Payer: Self-pay

## 2020-11-05 ENCOUNTER — Inpatient Hospital Stay (HOSPITAL_COMMUNITY)
Admission: AD | Admit: 2020-11-05 | Discharge: 2020-11-05 | Disposition: A | Discharge status: Home / Self Care | Payer: 59 | Attending: Obstetrics & Gynecology | Admitting: Obstetrics & Gynecology

## 2020-11-05 DIAGNOSIS — O26891 Other specified pregnancy related conditions, first trimester: Secondary | ICD-10-CM | POA: Diagnosis not present

## 2020-11-05 DIAGNOSIS — R5381 Other malaise: Secondary | ICD-10-CM | POA: Diagnosis not present

## 2020-11-05 DIAGNOSIS — R519 Headache, unspecified: Secondary | ICD-10-CM | POA: Insufficient documentation

## 2020-11-05 DIAGNOSIS — O99891 Other specified diseases and conditions complicating pregnancy: Secondary | ICD-10-CM

## 2020-11-05 DIAGNOSIS — R110 Nausea: Secondary | ICD-10-CM | POA: Diagnosis not present

## 2020-11-05 DIAGNOSIS — R5383 Other fatigue: Secondary | ICD-10-CM | POA: Insufficient documentation

## 2020-11-05 DIAGNOSIS — M791 Myalgia, unspecified site: Secondary | ICD-10-CM

## 2020-11-05 DIAGNOSIS — Z3A09 9 weeks gestation of pregnancy: Secondary | ICD-10-CM | POA: Diagnosis not present

## 2020-11-05 LAB — URINALYSIS, ROUTINE W REFLEX MICROSCOPIC
Bilirubin Urine: NEGATIVE
Glucose, UA: NEGATIVE mg/dL
Hgb urine dipstick: NEGATIVE
Ketones, ur: NEGATIVE mg/dL
Nitrite: NEGATIVE
Protein, ur: NEGATIVE mg/dL
Specific Gravity, Urine: 1.016 (ref 1.005–1.030)
pH: 6 (ref 5.0–8.0)

## 2020-11-05 LAB — COMPREHENSIVE METABOLIC PANEL
ALT: 12 U/L (ref 0–44)
AST: 39 U/L (ref 15–41)
Albumin: 3.3 g/dL — ABNORMAL LOW (ref 3.5–5.0)
Alkaline Phosphatase: 47 U/L (ref 38–126)
Anion gap: 8 (ref 5–15)
BUN: 8 mg/dL (ref 6–20)
CO2: 21 mmol/L — ABNORMAL LOW (ref 22–32)
Calcium: 8.7 mg/dL — ABNORMAL LOW (ref 8.9–10.3)
Chloride: 105 mmol/L (ref 98–111)
Creatinine, Ser: 0.68 mg/dL (ref 0.44–1.00)
GFR, Estimated: >60 mL/min (ref >60)
Glucose, Bld: 90 mg/dL (ref 70–99)
Potassium: 4.6 mmol/L (ref 3.5–5.1)
Sodium: 134 mmol/L — ABNORMAL LOW (ref 135–145)
Total Bilirubin: 1 mg/dL (ref 0.3–1.2)
Total Protein: 6.1 g/dL — ABNORMAL LOW (ref 6.5–8.1)

## 2020-11-05 LAB — CBC WITH DIFFERENTIAL/PLATELET
Abs Immature Granulocytes: 0.02 10*3/uL (ref 0.00–0.07)
Basophils Absolute: 0.1 10*3/uL (ref 0.0–0.1)
Basophils Relative: 1 %
Eosinophils Absolute: 0.3 10*3/uL (ref 0.0–0.5)
Eosinophils Relative: 4 %
HCT: 36.9 % (ref 36.0–46.0)
Hemoglobin: 12 g/dL (ref 12.0–15.0)
Immature Granulocytes: 0 %
Lymphocytes Relative: 15 %
Lymphs Abs: 1.2 10*3/uL (ref 0.7–4.0)
MCH: 28.6 pg (ref 26.0–34.0)
MCHC: 32.5 g/dL (ref 30.0–36.0)
MCV: 88.1 fL (ref 80.0–100.0)
Monocytes Absolute: 0.8 10*3/uL (ref 0.1–1.0)
Monocytes Relative: 10 %
Neutro Abs: 5.4 10*3/uL (ref 1.7–7.7)
Neutrophils Relative %: 70 %
Platelets: 291 10*3/uL (ref 150–400)
RBC: 4.19 MIL/uL (ref 3.87–5.11)
RDW: 13.4 % (ref 11.5–15.5)
WBC: 7.7 10*3/uL (ref 4.0–10.5)
nRBC: 0 % (ref 0.0–0.2)

## 2020-11-05 NOTE — MAU Provider Note (Signed)
History     [id number]  Arrival date and time: 11/05/20 1757    Chief Complaint  Patient presents with   Fatigue     HPI Peggy Mckenzie is a 29 y.o. at [redacted]w[redacted]d by 7wk u/s with no significant PMHx presents to MAU at the recommendation of her OBGYN to check fetal status given her vague symptoms. Patient has fatigue/malaise, nausea, headaches, myalgias that started yesterday. No urinary symptoms. No fever/chills. Patient has been COVID tested by [facility] earlier today and is presenting to MAU at the recommendation of her OBGYN.   Review of outside prenatal records from [facility] (in media tab): unremarkable intake visit    OB History    Gravida  5   Para  3   Term  3   Preterm      AB  1   Living  3     SAB  1   IAB      Ectopic      Multiple  0   Live Births  3           Past Medical History:  Diagnosis Date   Bipolar 1 disorder (HCC)    Bipolar 1 disorder, depressed (HCC)    currently stable, no meds, no problems   Depression    Headache    Infection    frequent UTI's    Past Surgical History:  Procedure Laterality Date   CYST REMOVAL HAND     FRACTURE SURGERY Left    leg    Family History  Problem Relation Age of Onset   Birth defects Paternal Aunt        spina bifida   Hypertension Paternal Grandmother     Social History   Socioeconomic History   Marital status: Married    Spouse name: Not on file   Number of children: Not on file   Years of education: Not on file   Highest education level: Not on file  Occupational History   Not on file  Tobacco Use   Smoking status: Never Smoker   Smokeless tobacco: Never Used  Substance and Sexual Activity   Alcohol use: No    Comment: occasional,, not now   Drug use: No   Sexual activity: Yes    Birth control/protection: None  Other Topics Concern   Not on file  Social History Narrative   Not on file   Social Determinants of Health    Financial Resource Strain: Not on file  Food Insecurity: Not on file  Transportation Needs: Not on file  Physical Activity: Not on file  Stress: Not on file  Social Connections: Not on file  Intimate Partner Violence: Not on file    No Active Allergies  No current facility-administered medications on file prior to encounter.   Current Outpatient Medications on File Prior to Encounter  Medication Sig Dispense Refill   acetaminophen (TYLENOL) 500 MG tablet Take 1 tablet (500 mg total) by mouth every 6 (six) hours as needed. (Patient not taking: Reported on 07/16/2020) 30 tablet 0   cyclobenzaprine (FLEXERIL) 10 MG tablet Take 1 tablet (10 mg total) by mouth 2 (two) times daily as needed for muscle spasms. (Patient not taking: Reported on 07/16/2020) 10 tablet 0   docusate sodium (COLACE) 100 MG capsule Take 1 capsule (100 mg total) by mouth 2 (two) times daily. (Patient not taking: Reported on 07/16/2020) 60 capsule 0     Review of Systems  Constitutional: Positive  for chills, fever and malaise/fatigue.  HENT: Positive for congestion.   Eyes: Negative for blurred vision and double vision.  Respiratory: Positive for cough. Negative for shortness of breath.   Cardiovascular: Negative for chest pain, palpitations and leg swelling.  Gastrointestinal: Positive for nausea. Negative for abdominal pain and vomiting.  Genitourinary: Negative for dysuria, flank pain, hematuria and urgency.  Musculoskeletal: Positive for myalgias.  Skin: Negative for itching and rash.  Neurological: Positive for headaches. Negative for dizziness, loss of consciousness and weakness.     Pertinent positives and negative per HPI, all others reviewed and negative  Physical Exam   BP (!) 141/72 (BP Location: Right Arm)    Pulse 87    Temp 99.4 F (37.4 C) (Oral)    Resp 20    Ht 5\' 3"  (1.6 m)    Wt 94 kg    LMP 09/01/2020    BMI 36.70 kg/m   Physical Exam Vitals and nursing note reviewed. Exam conducted  with a chaperone present.  Constitutional:      General: She is not in acute distress.    Appearance: Normal appearance. She is normal weight.  HENT:     Head: Normocephalic and atraumatic.     Nose: Nose normal.     Mouth/Throat:     Mouth: Mucous membranes are moist.     Pharynx: Oropharynx is clear.  Eyes:     Extraocular Movements: Extraocular movements intact.     Conjunctiva/sclera: Conjunctivae normal.  Cardiovascular:     Rate and Rhythm: Normal rate.     Pulses: Normal pulses.  Pulmonary:     Effort: Pulmonary effort is normal.  Musculoskeletal:        General: Normal range of motion.     Cervical back: Normal range of motion and neck supple.  Skin:    General: Skin is warm and dry.  Neurological:     General: No focal deficit present.     Mental Status: She is alert and oriented to person, place, and time. Mental status is at baseline.  Psychiatric:        Mood and Affect: Mood normal.        Behavior: Behavior normal.     Cervical Exam  not indicated  Bedside Ultrasound +cardiac activity, FHR 150s   Labs Results for orders placed or performed during the hospital encounter of 11/05/20 (from the past 24 hour(s))  Urinalysis, Routine w reflex microscopic Urine, Clean Catch     Status: Abnormal   Collection Time: 11/05/20  7:47 PM  Result Value Ref Range   Color, Urine YELLOW YELLOW   APPearance CLEAR CLEAR   Specific Gravity, Urine 1.016 1.005 - 1.030   pH 6.0 5.0 - 8.0   Glucose, UA NEGATIVE NEGATIVE mg/dL   Hgb urine dipstick NEGATIVE NEGATIVE   Bilirubin Urine NEGATIVE NEGATIVE   Ketones, ur NEGATIVE NEGATIVE mg/dL   Protein, ur NEGATIVE NEGATIVE mg/dL   Nitrite NEGATIVE NEGATIVE   Leukocytes,Ua TRACE (A) NEGATIVE   RBC / HPF 0-5 0 - 5 RBC/hpf   WBC, UA 6-10 0 - 5 WBC/hpf   Bacteria, UA RARE (A) NONE SEEN   Squamous Epithelial / LPF 6-10 0 - 5   Mucus PRESENT   CBC with Differential/Platelet     Status: None   Collection Time: 11/05/20  9:14 PM   Result Value Ref Range   WBC 7.7 4.0 - 10.5 K/uL   RBC 4.19 3.87 - 5.11 MIL/uL   Hemoglobin 12.0  12.0 - 15.0 g/dL   HCT 23.7 62.8 - 31.5 %   MCV 88.1 80.0 - 100.0 fL   MCH 28.6 26.0 - 34.0 pg   MCHC 32.5 30.0 - 36.0 g/dL   RDW 17.6 16.0 - 73.7 %   Platelets 291 150 - 400 K/uL   nRBC 0.0 0.0 - 0.2 %   Neutrophils Relative % 70 %   Neutro Abs 5.4 1.7 - 7.7 K/uL   Lymphocytes Relative 15 %   Lymphs Abs 1.2 0.7 - 4.0 K/uL   Monocytes Relative 10 %   Monocytes Absolute 0.8 0.1 - 1.0 K/uL   Eosinophils Relative 4 %   Eosinophils Absolute 0.3 0.0 - 0.5 K/uL   Basophils Relative 1 %   Basophils Absolute 0.1 0.0 - 0.1 K/uL   Immature Granulocytes 0 %   Abs Immature Granulocytes 0.02 0.00 - 0.07 K/uL  Comprehensive metabolic panel     Status: Abnormal   Collection Time: 11/05/20  9:14 PM  Result Value Ref Range   Sodium 134 (L) 135 - 145 mmol/L   Potassium 4.6 3.5 - 5.1 mmol/L   Chloride 105 98 - 111 mmol/L   CO2 21 (L) 22 - 32 mmol/L   Glucose, Bld 90 70 - 99 mg/dL   BUN 8 6 - 20 mg/dL   Creatinine, Ser 1.06 0.44 - 1.00 mg/dL   Calcium 8.7 (L) 8.9 - 10.3 mg/dL   Total Protein 6.1 (L) 6.5 - 8.1 g/dL   Albumin 3.3 (L) 3.5 - 5.0 g/dL   AST 39 15 - 41 U/L   ALT 12 0 - 44 U/L   Alkaline Phosphatase 47 38 - 126 U/L   Total Bilirubin 1.0 0.3 - 1.2 mg/dL   GFR, Estimated >26 >94 mL/min   Anion gap 8 5 - 15    Imaging No results found.  MAU Course  Procedures  Lab Orders     Culture, OB Urine     Urinalysis, Routine w reflex microscopic Urine, Clean Catch     CBC with Differential/Platelet     Comprehensive metabolic panel No orders of the defined types were placed in this encounter.  Imaging Orders  No imaging studies ordered today    MDM mild  Assessment and Plan  30 y.o. [id number] presents at 103w2d with generalized symptoms suspicious for COVID for fetal clearance per recommendation of OBGYN.  #sick symptoms Patient presented to [facility] earlier today for  COVID screening given generalized symptoms. VSS. Overall unremarkable exam upon screening. FHR noted on bedside ultrasound. Sent to MAU for fetal clearance. Patient instructed to quarantine and await results of pending COVID test.    Alric Seton

## 2020-11-05 NOTE — MAU Note (Addendum)
PT SAYS SHE FEELS LIKE NO ENERGY AND TIRED , EXHAUSTED - STARTED Monday NIGHT . STARTED H/A LAST NIGHT - DID NOT TAKE MEDS- TOOK XS 2 TAB TYLENOL AT 445PM- 6/10.   STARTED COUGH  CONTINOUS THIS AM. 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   - WAS TESTED FOR COVID AT 230PM.   IS [facility] FOR WORK TOMORROW.  HAS NOT BE EXPOSED TO COVID. TEMP AT 5PM- 100.4.    SHE WORKED YESTERDAY- ALSO CRAMPING AND BACK PAIN . DR Sallye Ober TOLD TO COME IN .

## 2020-11-05 NOTE — MAU Note (Signed)
ALSO IS BEING TREATED FOR UTI- TOOK MEDS TODAY

## 2020-11-07 LAB — CULTURE, OB URINE: Culture: <10000 — AB

## 2020-11-21 DIAGNOSIS — N390 Urinary tract infection, site not specified: Secondary | ICD-10-CM | POA: Diagnosis not present

## 2020-11-21 DIAGNOSIS — Z3A11 11 weeks gestation of pregnancy: Secondary | ICD-10-CM | POA: Diagnosis not present

## 2020-11-21 DIAGNOSIS — O3680X9 Pregnancy with inconclusive fetal viability, other fetus: Secondary | ICD-10-CM | POA: Diagnosis not present

## 2020-11-21 DIAGNOSIS — Z3481 Encounter for supervision of other normal pregnancy, first trimester: Secondary | ICD-10-CM | POA: Diagnosis not present

## 2020-12-05 DIAGNOSIS — M9902 Segmental and somatic dysfunction of thoracic region: Secondary | ICD-10-CM | POA: Diagnosis not present

## 2020-12-05 DIAGNOSIS — M62830 Muscle spasm of back: Secondary | ICD-10-CM | POA: Diagnosis not present

## 2020-12-05 DIAGNOSIS — M9905 Segmental and somatic dysfunction of pelvic region: Secondary | ICD-10-CM | POA: Diagnosis not present

## 2020-12-05 DIAGNOSIS — M9903 Segmental and somatic dysfunction of lumbar region: Secondary | ICD-10-CM | POA: Diagnosis not present

## 2020-12-06 DIAGNOSIS — M9905 Segmental and somatic dysfunction of pelvic region: Secondary | ICD-10-CM | POA: Diagnosis not present

## 2020-12-06 DIAGNOSIS — M62830 Muscle spasm of back: Secondary | ICD-10-CM | POA: Diagnosis not present

## 2020-12-06 DIAGNOSIS — M9902 Segmental and somatic dysfunction of thoracic region: Secondary | ICD-10-CM | POA: Diagnosis not present

## 2020-12-06 DIAGNOSIS — M9903 Segmental and somatic dysfunction of lumbar region: Secondary | ICD-10-CM | POA: Diagnosis not present

## 2020-12-11 MED FILL — valACYclovir HCL 1 GM TABS: 1 | 90 days supply | Qty: 90 | Fill #0

## 2020-12-13 DIAGNOSIS — M9902 Segmental and somatic dysfunction of thoracic region: Secondary | ICD-10-CM | POA: Diagnosis not present

## 2020-12-13 DIAGNOSIS — M62830 Muscle spasm of back: Secondary | ICD-10-CM | POA: Diagnosis not present

## 2020-12-13 DIAGNOSIS — M9905 Segmental and somatic dysfunction of pelvic region: Secondary | ICD-10-CM | POA: Diagnosis not present

## 2020-12-13 DIAGNOSIS — M9903 Segmental and somatic dysfunction of lumbar region: Secondary | ICD-10-CM | POA: Diagnosis not present

## 2021-01-04 MED FILL — valACYclovir HCL 1 GM TABS: 1 | 90 days supply | Qty: 90 | Fill #0

## 2021-01-10 ENCOUNTER — Other Ambulatory Visit: Payer: Self-pay

## 2021-01-10 ENCOUNTER — Encounter (HOSPITAL_COMMUNITY): Payer: Self-pay | Admitting: Obstetrics and Gynecology

## 2021-01-10 ENCOUNTER — Inpatient Hospital Stay (HOSPITAL_COMMUNITY)
Admission: AD | Admit: 2021-01-10 | Discharge: 2021-01-12 | DRG: 833 | Disposition: A | Discharge status: Home / Self Care | Payer: 59 | Attending: Obstetrics & Gynecology | Admitting: Obstetrics & Gynecology

## 2021-01-10 DIAGNOSIS — N12 Tubulo-interstitial nephritis, not specified as acute or chronic: Secondary | ICD-10-CM | POA: Diagnosis present

## 2021-01-10 DIAGNOSIS — O2302 Infections of kidney in pregnancy, second trimester: Secondary | ICD-10-CM | POA: Diagnosis not present

## 2021-01-10 DIAGNOSIS — R109 Unspecified abdominal pain: Secondary | ICD-10-CM | POA: Diagnosis present

## 2021-01-10 DIAGNOSIS — Z3A18 18 weeks gestation of pregnancy: Secondary | ICD-10-CM

## 2021-01-10 DIAGNOSIS — Z3492 Encounter for supervision of normal pregnancy, unspecified, second trimester: Secondary | ICD-10-CM

## 2021-01-10 DIAGNOSIS — N10 Acute tubulo-interstitial nephritis: Secondary | ICD-10-CM | POA: Diagnosis not present

## 2021-01-10 LAB — URINALYSIS, ROUTINE W REFLEX MICROSCOPIC
Bilirubin Urine: NEGATIVE
Glucose, UA: NEGATIVE mg/dL
Hgb urine dipstick: NEGATIVE
Ketones, ur: 20 mg/dL — AB
Nitrite: POSITIVE — AB
Protein, ur: NEGATIVE mg/dL
Specific Gravity, Urine: 1.017 (ref 1.005–1.030)
WBC, UA: >50 WBC/hpf — ABNORMAL HIGH (ref 0–5)
pH: 7 (ref 5.0–8.0)

## 2021-01-10 LAB — CBC WITH DIFFERENTIAL/PLATELET
Abs Immature Granulocytes: 0.1 10*3/uL — ABNORMAL HIGH (ref 0.00–0.07)
Basophils Absolute: 0 10*3/uL (ref 0.0–0.1)
Basophils Relative: 0 %
Eosinophils Absolute: 0 10*3/uL (ref 0.0–0.5)
Eosinophils Relative: 0 %
HCT: 34.9 % — ABNORMAL LOW (ref 36.0–46.0)
Hemoglobin: 11.5 g/dL — ABNORMAL LOW (ref 12.0–15.0)
Immature Granulocytes: 1 %
Lymphocytes Relative: 5 %
Lymphs Abs: 0.7 10*3/uL (ref 0.7–4.0)
MCH: 29.1 pg (ref 26.0–34.0)
MCHC: 33 g/dL (ref 30.0–36.0)
MCV: 88.4 fL (ref 80.0–100.0)
Monocytes Absolute: 0.4 10*3/uL (ref 0.1–1.0)
Monocytes Relative: 3 %
Neutro Abs: 13.8 10*3/uL — ABNORMAL HIGH (ref 1.7–7.7)
Neutrophils Relative %: 91 %
Platelets: 323 10*3/uL (ref 150–400)
RBC: 3.95 MIL/uL (ref 3.87–5.11)
RDW: 13.4 % (ref 11.5–15.5)
WBC: 15.1 10*3/uL — ABNORMAL HIGH (ref 4.0–10.5)
nRBC: 0 % (ref 0.0–0.2)

## 2021-01-10 LAB — COMPREHENSIVE METABOLIC PANEL
ALT: 12 U/L (ref 0–44)
AST: 16 U/L (ref 15–41)
Albumin: 3.1 g/dL — ABNORMAL LOW (ref 3.5–5.0)
Alkaline Phosphatase: 48 U/L (ref 38–126)
Anion gap: 8 (ref 5–15)
BUN: 9 mg/dL (ref 6–20)
CO2: 22 mmol/L (ref 22–32)
Calcium: 8.3 mg/dL — ABNORMAL LOW (ref 8.9–10.3)
Chloride: 103 mmol/L (ref 98–111)
Creatinine, Ser: 0.67 mg/dL (ref 0.44–1.00)
GFR, Estimated: >60 mL/min (ref >60)
Glucose, Bld: 98 mg/dL (ref 70–99)
Potassium: 3.9 mmol/L (ref 3.5–5.1)
Sodium: 133 mmol/L — ABNORMAL LOW (ref 135–145)
Total Bilirubin: 0.4 mg/dL (ref 0.3–1.2)
Total Protein: 6.5 g/dL (ref 6.5–8.1)

## 2021-01-10 LAB — TYPE AND SCREEN
ABO/RH(D): O POS
Antibody Screen: NEGATIVE

## 2021-01-10 MED ORDER — CALCIUM CARBONATE ANTACID 500 MG PO CHEW: 2.0000 | CHEWABLE_TABLET | ORAL | Status: DC | PRN

## 2021-01-10 MED ORDER — HYDROMORPHONE HCL 1 MG/ML IJ SOLN
0.5000 mg | Freq: Once | INTRAMUSCULAR | Status: AC
  Administered 2021-01-10: 0.5 mg via INTRAVENOUS
  Filled 2021-01-10: qty 1

## 2021-01-10 MED ORDER — PROMETHAZINE HCL 25 MG/ML IJ SOLN
12.5000 mg | Freq: Once | INTRAMUSCULAR | Status: AC
  Administered 2021-01-10: 12.5 mg via INTRAVENOUS
  Filled 2021-01-10 (×2): qty 1

## 2021-01-10 MED ORDER — ONDANSETRON HCL 4 MG/2ML IJ SOLN
4.0000 mg | Freq: Four times a day (QID) | INTRAMUSCULAR | Status: DC | PRN
  Administered 2021-01-11: 4 mg via INTRAVENOUS
  Filled 2021-01-10: qty 2

## 2021-01-10 MED ORDER — ONDANSETRON HCL 4 MG/2ML IJ SOLN
4.0000 mg | Freq: Once | INTRAMUSCULAR | Status: AC
  Administered 2021-01-10: 4 mg via INTRAVENOUS
  Filled 2021-01-10: qty 2

## 2021-01-10 MED ORDER — ZOLPIDEM TARTRATE 5 MG PO TABS: 5.0000 mg | ORAL_TABLET | Freq: Every evening | ORAL | Status: DC | PRN

## 2021-01-10 MED ORDER — LACTATED RINGERS IV SOLN: INTRAVENOUS | Status: DC

## 2021-01-10 MED ORDER — SODIUM CHLORIDE 0.9 % IV SOLN
2.0000 g | INTRAVENOUS | Status: DC
  Administered 2021-01-11: 2 g via INTRAVENOUS
  Filled 2021-01-10: qty 20
  Filled 2021-01-10: qty 2

## 2021-01-10 MED ORDER — DOCUSATE SODIUM 100 MG PO CAPS
100.0000 mg | ORAL_CAPSULE | Freq: Every day | ORAL | Status: DC
  Filled 2021-01-10: qty 1

## 2021-01-10 MED ORDER — SODIUM CHLORIDE 0.9 % IV SOLN
2.0000 g | Freq: Once | INTRAVENOUS | Status: AC
  Administered 2021-01-10: 2 g via INTRAVENOUS
  Filled 2021-01-10: qty 2

## 2021-01-10 MED ORDER — ACETAMINOPHEN 325 MG PO TABS
650.0000 mg | ORAL_TABLET | ORAL | Status: DC | PRN
  Administered 2021-01-10: 650 mg via ORAL
  Filled 2021-01-10: qty 2

## 2021-01-10 MED ORDER — ONDANSETRON HCL 4 MG/2ML IJ SOLN: 4.0000 mg | Freq: Four times a day (QID) | INTRAMUSCULAR | Status: DC

## 2021-01-10 MED ORDER — SODIUM CHLORIDE 0.9 % IV SOLN: INTRAVENOUS | Status: DC

## 2021-01-10 MED ORDER — HYDROMORPHONE HCL 1 MG/ML IJ SOLN
0.5000 mg | INTRAMUSCULAR | Status: DC | PRN
  Administered 2021-01-11 (×2): 0.5 mg via INTRAVENOUS
  Filled 2021-01-10 (×3): qty 0.5

## 2021-01-10 MED ORDER — PRENATAL MULTIVITAMIN CH
1.0000 | ORAL_TABLET | Freq: Every day | ORAL | Status: DC
  Administered 2021-01-11 – 2021-01-12 (×2): 1 via ORAL
  Filled 2021-01-10 (×2): qty 1

## 2021-01-10 NOTE — MAU Note (Signed)
Pt reports symptoms of UTI x one week, today she just has not felt well, nausea, vomited x 2. Also started having left flank pain this pm. Temp at home 99.5.

## 2021-01-10 NOTE — MAU Provider Note (Signed)
History     CSN: [id number]  Arrival date and time: 01/10/21 1912   Event Date/Time   First Provider Initiated Contact with Patient 01/10/21 2005      Chief Complaint  Patient presents with  . Nausea  . Emesis  . Flank Pain   HPI Peggy Mckenzie is a 30 y.o. [phone] at [redacted]w[redacted]d who presents with flank pain & n/v. Reports urinary symptoms of foul smelling cloudy urine & urinary frequency since Sunday. Today she started having vomiting & left flank pain. Reports history of multiple UTIs & pyelonephritis. Last treated for a UTI in December. Felt chills but didn't check temp. Denies abdominal pain, vaginal bleeding, or LOF. Took tylenol today without relief. Rates pain 7/10. Describes as constant achy. Nothing makes better or worse.   OB History    Gravida  5   Para  3   Term  3   Preterm      AB  1   Living  3     SAB  1   IAB      Ectopic      Multiple  0   Live Births  3           Past Medical History:  Diagnosis Date  . Bipolar 1 disorder (HCC)   . Depression   . Headache   . Infection    frequent UTI's  . Pyelonephritis during pregnancy 2021    Past Surgical History:  Procedure Laterality Date  . CYST REMOVAL HAND    . FRACTURE SURGERY Left    leg    Family History  Problem Relation Age of Onset  . Birth defects Paternal Aunt        spina bifida  . Hypertension Paternal Grandmother     Social History   Tobacco Use  . Smoking status: Never Smoker  . Smokeless tobacco: Never Used  Substance Use Topics  . Alcohol use: No    Comment: occasional,, not now  . Drug use: No    Allergies:  Allergies  Allergen Reactions  . Betadine [Povidone Iodine] Itching    Medications Prior to Admission  Medication Sig Dispense Refill Last Dose  . acetaminophen (TYLENOL) 500 MG tablet Take 1 tablet (500 mg total) by mouth every 6 (six) hours as needed. 30 tablet 0 01/10/2021 at Unknown time  . Prenatal Vit-Fe Fumarate-FA (PRENATAL MULTIVITAMIN) TABS  tablet Take 1 tablet by mouth daily at 12 noon.   01/09/2021 at Unknown time  . valACYclovir (VALTREX) 1000 MG tablet Take 1,000 mg by mouth 2 (two) times daily.   01/09/2021 at Unknown time    Review of Systems  Constitutional: Positive for chills. Negative for fever.  HENT: Negative.   Gastrointestinal: Positive for nausea and vomiting. Negative for abdominal pain and diarrhea.  Genitourinary: Positive for flank pain and frequency. Negative for dysuria, hematuria and vaginal bleeding.   Physical Exam   Blood pressure 128/70, pulse 97, temperature 99.6 F (37.6 C), temperature source Oral, resp. rate 18, height 5\' 3"  (1.6 m), weight 98 kg, last menstrual period 09/01/2020, SpO2 100 %, unknown if currently breastfeeding.  Physical Exam Vitals and nursing note reviewed.  Constitutional:      General: She is in acute distress.  HENT:     Head: Normocephalic and atraumatic.  Pulmonary:     Effort: Pulmonary effort is normal. No respiratory distress.  Abdominal:     Tenderness: There is left CVA tenderness. There is no right CVA tenderness.  Skin:    General: Skin is warm and dry.  Neurological:     Mental Status: She is alert.  Psychiatric:        Mood and Affect: Mood normal.        Behavior: Behavior normal.     MAU Course  Procedures Results for orders placed or performed during the hospital encounter of 01/10/21 (from the past 24 hour(s))  Urinalysis, Routine w reflex microscopic Urine, Clean Catch     Status: Abnormal   Collection Time: 01/10/21  7:48 PM  Result Value Ref Range   Color, Urine YELLOW YELLOW   APPearance HAZY (A) CLEAR   Specific Gravity, Urine 1.017 1.005 - 1.030   pH 7.0 5.0 - 8.0   Glucose, UA NEGATIVE NEGATIVE mg/dL   Hgb urine dipstick NEGATIVE NEGATIVE   Bilirubin Urine NEGATIVE NEGATIVE   Ketones, ur 20 (A) NEGATIVE mg/dL   Protein, ur NEGATIVE NEGATIVE mg/dL   Nitrite POSITIVE (A) NEGATIVE   Leukocytes,Ua SMALL (A) NEGATIVE   RBC / HPF 0-5 0 -  5 RBC/hpf   WBC, UA >50 (H) 0 - 5 WBC/hpf   Bacteria, UA MANY (A) NONE SEEN   Squamous Epithelial / LPF 0-5 0 - 5   Mucus PRESENT   CBC with Differential/Platelet     Status: Abnormal   Collection Time: 01/10/21  8:22 PM  Result Value Ref Range   WBC 15.1 (H) 4.0 - 10.5 K/uL   RBC 3.95 3.87 - 5.11 MIL/uL   Hemoglobin 11.5 (L) 12.0 - 15.0 g/dL   HCT 19.3 (L) 79.0 - 24.0 %   MCV 88.4 80.0 - 100.0 fL   MCH 29.1 26.0 - 34.0 pg   MCHC 33.0 30.0 - 36.0 g/dL   RDW 97.3 53.2 - 99.2 %   Platelets 323 150 - 400 K/uL   nRBC 0.0 0.0 - 0.2 %   Neutrophils Relative % 91 %   Neutro Abs 13.8 (H) 1.7 - 7.7 K/uL   Lymphocytes Relative 5 %   Lymphs Abs 0.7 0.7 - 4.0 K/uL   Monocytes Relative 3 %   Monocytes Absolute 0.4 0.1 - 1.0 K/uL   Eosinophils Relative 0 %   Eosinophils Absolute 0.0 0.0 - 0.5 K/uL   Basophils Relative 0 %   Basophils Absolute 0.0 0.0 - 0.1 K/uL   Immature Granulocytes 1 %   Abs Immature Granulocytes 0.10 (H) 0.00 - 0.07 K/uL    MDM FHT present via doppler  Patient visibly uncomfortable & in room vomiting. Reports left flank pain, +left CVA tenderness, and hx significant for multiple episodes of UTIs/Pyelo. Will start IV while waiting for urinalysis. Normal saline, zofran, dilaudid, & rocephin ordered.   U/a + nitrites. Low grade temp 99.6. Leukocytosis present.  Reviewed with Dr. Alysia Penna; recommends admission for pyelonephritis in pregnancy  Assessment and Plan   1. Pyelonephritis affecting pregnancy in second trimester   2. [redacted] weeks gestation of pregnancy    -left voicemail with Dr. Connye Burkitt. Spoke with [facility] ([facility]) with recommendation for admission -Peggy Mckenzie will come see patient for admission  Peggy Mckenzie 01/10/2021, 8:54 PM

## 2021-01-10 NOTE — H&P (Signed)
Peggy Mckenzie is a 30 y.o. female, G5P3013, IUP at 18.5 weeks, presenting for peylonephritis.Denies vaginal leakage. Denies vaginal bleeding. Denies feeling cxt's.   MAU HPI:  Peggy Mckenzie is a 30 y.o. [phone] at [redacted]w[redacted]d who presents with flank pain & n/v. Reports urinary symptoms of foul smelling cloudy urine & urinary frequency since Sunday. Today she started having vomiting & left flank pain. Reports history of multiple UTIs & pyelonephritis. Last treated for a UTI in December. Felt chills but didn't check temp. Denies abdominal pain, vaginal bleeding, or LOF. Took tylenol today without relief. Rates pain 7/10. Describes as constant achy. Nothing makes better or worse.   MDM FHT present via doppler  Patient visibly uncomfortable & in room vomiting. Reports left flank pain, +left CVA tenderness, and hx significant for multiple episodes of UTIs/Pyelo. Will start IV while waiting for urinalysis. Normal saline, zofran, dilaudid, & rocephin ordered.   U/a + nitrites. Low grade temp 99.6. Leukocytosis present.  Reviewed with Dr. Alysia Penna; recommends admission for pyelonephritis in pregnancy  Patient Active Problem List   Diagnosis Date Noted  . Pyelonephritis 01/10/2021  . Spontaneous vaginal delivery 08/21/2016  . Postpartum state 10/18/2014  . Pregnancy 10/17/2014  . Abdominal pain, unspecified site 06/29/2013  . BV (bacterial vaginosis) 06/29/2013  . Side pain 06/11/2013    Medications Prior to Admission  Medication Sig Dispense Refill Last Dose  . acetaminophen (TYLENOL) 500 MG tablet Take 1 tablet (500 mg total) by mouth every 6 (six) hours as needed. 30 tablet 0 01/10/2021 at Unknown time  . Prenatal Vit-Fe Fumarate-FA (PRENATAL MULTIVITAMIN) TABS tablet Take 1 tablet by mouth daily at 12 noon.   01/09/2021 at Unknown time  . valACYclovir (VALTREX) 1000 MG tablet Take 1,000 mg by mouth 2 (two) times daily.   01/09/2021 at Unknown time    Past Medical History:  Diagnosis Date  .  Bipolar 1 disorder (HCC)   . Depression   . Headache   . Infection    frequent UTI's  . Pyelonephritis during pregnancy 2021     No current facility-administered medications on file prior to encounter.   Current Outpatient Medications on File Prior to Encounter  Medication Sig Dispense Refill  . acetaminophen (TYLENOL) 500 MG tablet Take 1 tablet (500 mg total) by mouth every 6 (six) hours as needed. 30 tablet 0  . Prenatal Vit-Fe Fumarate-FA (PRENATAL MULTIVITAMIN) TABS tablet Take 1 tablet by mouth daily at 12 noon.    . valACYclovir (VALTREX) 1000 MG tablet Take 1,000 mg by mouth 2 (two) times daily.       Allergies  Allergen Reactions  . Betadine [Povidone Iodine] Itching    OB History    Gravida  5   Para  3   Term  3   Preterm      AB  1   Living  3     SAB  1   IAB      Ectopic      Multiple  0   Live Births  3          Past Medical History:  Diagnosis Date  . Bipolar 1 disorder (HCC)   . Depression   . Headache   . Infection    frequent UTI's  . Pyelonephritis during pregnancy 2021   Past Surgical History:  Procedure Laterality Date  . CYST REMOVAL HAND    . FRACTURE SURGERY Left    leg   Family History: family history includes Birth  defects in her paternal aunt; Hypertension in her paternal grandmother. Social History:  reports that she has never smoked. She has never used smokeless tobacco. She reports that she does not drink alcohol and does not use drugs.   ROS:  Review of Systems  Constitutional: Positive for chills and fever.  HENT: Negative.   Eyes: Negative.   Respiratory: Negative.   Cardiovascular: Negative.   Gastrointestinal: Positive for abdominal pain, nausea and vomiting.  Genitourinary: Positive for flank pain, frequency and urgency.  Musculoskeletal: Positive for back pain.  Skin: Negative.   Neurological: Negative.   Endo/Heme/Allergies: Negative.   Psychiatric/Behavioral: Negative.      Physical Exam: BP  128/70 (BP Location: Right Arm)   Pulse 97   Temp 99.6 F (37.6 C) (Oral)   Resp 18   Ht 5\' 3"  (1.6 m)   Wt 98 kg   LMP 09/01/2020   SpO2 100%   BMI 38.26 kg/m   Physical Exam Vitals and nursing note reviewed.  Constitutional:      General: She is in acute distress.  HENT:     Head: Normocephalic and atraumatic.  Pulmonary:     Effort: Pulmonary effort is normal. No respiratory distress.  Abdominal:     Tenderness: There is left CVA tenderness. There is no right CVA tenderness.  Skin:    General: Skin is warm and dry.  Neurological:     Mental Status: She is alert.  Psychiatric:        Mood and Affect: Mood normal.        Behavior: Behavior normal.   FHT:  UC:   none   Labs: Results for orders placed or performed during the hospital encounter of 01/10/21 (from the past 24 hour(s))  Urinalysis, Routine w reflex microscopic Urine, Clean Catch     Status: Abnormal   Collection Time: 01/10/21  7:48 PM  Result Value Ref Range   Color, Urine YELLOW YELLOW   APPearance HAZY (A) CLEAR   Specific Gravity, Urine 1.017 1.005 - 1.030   pH 7.0 5.0 - 8.0   Glucose, UA NEGATIVE NEGATIVE mg/dL   Hgb urine dipstick NEGATIVE NEGATIVE   Bilirubin Urine NEGATIVE NEGATIVE   Ketones, ur 20 (A) NEGATIVE mg/dL   Protein, ur NEGATIVE NEGATIVE mg/dL   Nitrite POSITIVE (A) NEGATIVE   Leukocytes,Ua SMALL (A) NEGATIVE   RBC / HPF 0-5 0 - 5 RBC/hpf   WBC, UA >50 (H) 0 - 5 WBC/hpf   Bacteria, UA MANY (A) NONE SEEN   Squamous Epithelial / LPF 0-5 0 - 5   Mucus PRESENT   CBC with Differential/Platelet     Status: Abnormal   Collection Time: 01/10/21  8:22 PM  Result Value Ref Range   WBC 15.1 (H) 4.0 - 10.5 K/uL   RBC 3.95 3.87 - 5.11 MIL/uL   Hemoglobin 11.5 (L) 12.0 - 15.0 g/dL   HCT 03/12/21 (L) 16.1 - 09.6 %   MCV 88.4 80.0 - 100.0 fL   MCH 29.1 26.0 - 34.0 pg   MCHC 33.0 30.0 - 36.0 g/dL   RDW 04.5 40.9 - 81.1 %   Platelets 323 150 - 400 K/uL   nRBC 0.0 0.0 - 0.2 %   Neutrophils  Relative % 91 %   Neutro Abs 13.8 (H) 1.7 - 7.7 K/uL   Lymphocytes Relative 5 %   Lymphs Abs 0.7 0.7 - 4.0 K/uL   Monocytes Relative 3 %   Monocytes Absolute 0.4 0.1 - 1.0 K/uL  Eosinophils Relative 0 %   Eosinophils Absolute 0.0 0.0 - 0.5 K/uL   Basophils Relative 0 %   Basophils Absolute 0.0 0.0 - 0.1 K/uL   Immature Granulocytes 1 %   Abs Immature Granulocytes 0.10 (H) 0.00 - 0.07 K/uL    Imaging:  No results found.  MAU Course: Orders Placed This Encounter  Procedures  . Culture, OB Urine  . Urinalysis, Routine w reflex microscopic Urine, Clean Catch  . CBC with Differential/Platelet  . Comprehensive metabolic panel  . Diet regular Room service appropriate? Yes; Fluid consistency: Thin  . Notify physician (specify)  . Vital signs  . Defer vaginal exam for vaginal bleeding or PROM <37 weeks  . Initiate Oral Care Protocol  . Initiate Carrier Fluid Protocol  . Assess fetal heart tones by doppler  . Activity as tolerated  . Full code  . Type and screen [facility]  . Admit to Inpatient (patient's expected length of stay will be greater than 2 midnights or inpatient only procedure)   Meds ordered this encounter  Medications  . AND Linked Order Group   . 0.9 %  sodium chloride infusion   . cefTRIAXone (ROCEPHIN) 2 g in sodium chloride 0.9 % 100 mL IVPB     Order Specific Question:   Antibiotic Indication:     Answer:   UTI  . ondansetron (ZOFRAN) injection 4 mg  . HYDROmorphone (DILAUDID) injection 0.5 mg  . acetaminophen (TYLENOL) tablet 650 mg  . zolpidem (AMBIEN) tablet 5 mg  . docusate sodium (COLACE) capsule 100 mg  . calcium carbonate (TUMS - dosed in mg elemental calcium) chewable tablet 400 mg of elemental calcium  . prenatal multivitamin tablet 1 tablet  . lactated ringers infusion  . cefTRIAXone (ROCEPHIN) 2 g in sodium chloride 0.9 % 100 mL IVPB    Order Specific Question:   Antibiotic Indication:    Answer:   UTI    Order Specific  Question:   Other Indication:    Answer:   peylonephritis  . HYDROmorphone (DILAUDID) injection 0.5 mg    Assessment/Plan: MORGANA ROWLEY is a 30 y.o. female, G5P3013, IUP at 18.5 weeks, presenting for peylonephritis.Denies vaginal leakage. Denies vaginal bleeding. Denies feeling cxt's.   MDM FHT present via doppler  Patient visibly uncomfortable & in room vomiting. Reports left flank pain, +left CVA tenderness, and hx significant for multiple episodes of UTIs/Pyelo. Will start IV while waiting for urinalysis. Normal saline, zofran, dilaudid, & rocephin ordered.   U/a + nitrites. Low grade temp 99.6. Leukocytosis present. UC collected and sent.  Reviewed with Dr. Alysia Penna; recommends admission for pyelonephritis in pregnancy  Plan: Admit to Antepartum per consult with Dr Connye Burkitt Routine antenatal [facility] orders Regular diet Fetal doppler for tones Q8H Pain med dilaudid 0.5mg  IV Continue rocephin 2g Q24H, until feeling better then switch to PO abx and possible discharge.  Anticipate labor progression   Dale Johnson Siding NP-C, CNM, MSN 01/10/2021, 8:58 PM

## 2021-01-11 LAB — CBC
HCT: 33.4 % — ABNORMAL LOW (ref 36.0–46.0)
Hemoglobin: 11 g/dL — ABNORMAL LOW (ref 12.0–15.0)
MCH: 28.8 pg (ref 26.0–34.0)
MCHC: 32.9 g/dL (ref 30.0–36.0)
MCV: 87.4 fL (ref 80.0–100.0)
Platelets: 291 10*3/uL (ref 150–400)
RBC: 3.82 MIL/uL — ABNORMAL LOW (ref 3.87–5.11)
RDW: 13.6 % (ref 11.5–15.5)
WBC: 8 10*3/uL (ref 4.0–10.5)
nRBC: 0 % (ref 0.0–0.2)

## 2021-01-11 MED ORDER — OXYCODONE HCL 5 MG PO TABS
5.0000 mg | ORAL_TABLET | ORAL | Status: DC | PRN
  Administered 2021-01-11 – 2021-01-12 (×3): 5 mg via ORAL
  Filled 2021-01-11 (×3): qty 1

## 2021-01-11 MED ORDER — PROMETHAZINE HCL 25 MG/ML IJ SOLN
12.5000 mg | Freq: Four times a day (QID) | INTRAMUSCULAR | Status: DC | PRN
  Administered 2021-01-11 – 2021-01-12 (×4): 12.5 mg via INTRAVENOUS
  Filled 2021-01-11 (×4): qty 1

## 2021-01-11 MED ORDER — ACETAMINOPHEN 500 MG PO TABS
1000.0000 mg | ORAL_TABLET | Freq: Once | ORAL | Status: AC
  Administered 2021-01-11: 1000 mg via ORAL
  Filled 2021-01-11: qty 2

## 2021-01-11 NOTE — Progress Notes (Signed)
Peggy Mckenzie is a 30 y.o. [phone] at [redacted]w[redacted]d admitted for pyelonephritis  Subjective:  Feeling better, PO meds effective for management of back pain and Phenergan more effective than Zofran for management of nausea. Denies pain, urgency, or frequency w/ urination, denies flank pain.   Objective: Vitals:   01/11/21 0357 01/11/21 0752 01/11/21 1150 01/11/21 1151  BP: (!) 112/58 (!) 111/49  119/63  Pulse: 88 86  98  Resp: 18 18  18   Temp: 97.7 F (36.5 C) 98.3 F (36.8 C) 98.3 F (36.8 C)   TempSrc: Oral Oral Oral   SpO2: 99% 98% 98%   Weight:      Height:        I/O last 3 completed shifts: In: 714.9 [I.V.:714.9] Out: 450 [Urine:450] AAO x3, no signs of distress Cardiovascular: RRR Respiratory: Lung fields clear to ausculation GU/GI: soft, non-distended, BS x4  Extremities: no edema, negative for pain, tenderness, and cords  FHT:  150 per doppler UC:   Abd soft, gravid SVE:    deferred  Labs:   Recent Labs    01/10/21 2022  WBC 15.1*  HGB 11.5*  HCT 34.9*  PLT 323    Assessment / Plan: 30 y.o. 38 [redacted]w[redacted]d Pyelonephritis Continue Rocephin 2G Q 24 Tolerating solids now D/C IV Dilaudid 0.5 IV and start PO Roxicodone 5mg  Q4 PRN pain Repeat CBC Possible D/C 01/12/21  Plan reviewed w/ Dr. MSN, CNM 01/11/2021, 12:23 PM

## 2021-01-11 NOTE — Plan of Care (Signed)

## 2021-01-12 DIAGNOSIS — Z3492 Encounter for supervision of normal pregnancy, unspecified, second trimester: Secondary | ICD-10-CM

## 2021-01-12 MED ORDER — NITROFURANTOIN MONOHYD MACRO 100 MG PO CAPS
100.0000 mg | ORAL_CAPSULE | Freq: Two times a day (BID) | ORAL | Status: DC
  Administered 2021-01-12: 100 mg via ORAL
  Filled 2021-01-12: qty 1

## 2021-01-12 MED ORDER — NITROFURANTOIN MONOHYD MACRO 100 MG PO CAPS: 100.0000 mg | ORAL_CAPSULE | Freq: Two times a day (BID) | ORAL | 2 refills | Status: DC

## 2021-01-12 MED ORDER — ONDANSETRON HCL 4 MG PO TABS: 4.0000 mg | ORAL_TABLET | Freq: Three times a day (TID) | ORAL | 0 refills | Status: DC | PRN

## 2021-01-12 MED ORDER — ONDANSETRON HCL 4 MG PO TABS: 4.0000 mg | ORAL_TABLET | Freq: Three times a day (TID) | ORAL | Status: DC | PRN

## 2021-01-12 NOTE — Plan of Care (Signed)

## 2021-01-12 NOTE — Discharge Summary (Addendum)
Antenatal Discharge Summary     Patient Name: Peggy Mckenzie DOB: 1990-11-25 MRN: [id number]  Date of admission: 01/10/2021 Delivering MD: This patient has no babies on file. Date of deischarge: 01/12/2021  Admitting diagnosis: Pyelonephritis [N12] Intrauterine pregnancy: [redacted]w[redacted]d     Secondary diagnosis:  Active Problems:   Pyelonephritis   Pregnant and not yet delivered in second trimester                     History of Present Illness: Peggy Mckenzie is a 30 y.o. female, D5H2992, who presents at [redacted]w[redacted]d weeks gestation. The patient has been followed at  [facility]  Her pregnancy has been complicated by:  Patient Active Problem List   Diagnosis Date Noted  . Pregnant and not yet delivered in second trimester 01/12/2021  . Pyelonephritis 01/10/2021    Hospital course:   MAU HPI 01/10/2021:  Taniya Dasher Torresis a 69 [id number] at [redacted]w[redacted]d who presents with flank pain & n/v. Reports urinary symptoms of foul smelling cloudy urine & urinary frequency since Sunday. Today she started having vomiting & left flank pain. Reports history of multiple UTIs & pyelonephritis. Last treated for a UTI in December. Felt chills but didn't check temp. Denies abdominal pain, vaginal bleeding, or LOF. Took tylenol today without relief. Rates pain 7/10. Describes as constant achy. Nothing makes better or worse.  MDM FHT present via doppler  Patient visibly uncomfortable & in room vomiting. Reports left flank pain, +left CVA tenderness, and hx significant for multiple episodes of UTIs/Pyelo. Will start IV while waiting for urinalysis. Normal saline, zofran, dilaudid, & rocephin ordered.  U/a + nitrites. Low grade temp 99.6. Leukocytosis present. WBC 15.1 Reviewed with Dr. Alysia Penna; recommends admission for pyelonephritis in pregnancy  01/11/2021 Continued Pyelonephritis, Continue Rocephin 2G Q 24, Tolerating solids now D/C IV Dilaudid 0.5 IV and start PO Roxicodone  5mg  Q4 PRN pain, Repeat CBC resulted 8.0 Possible D/C 01/12/21  01/12/2021: Pt stable and comfortable, afebrile x24 hours , pt endorses feeling much better and ready to be discharged, work note given for tomorrow, rocephin d/c'ed, pt received 2 doses of rocephin and under the impression that her pyelonephritis has been tx'ed properly, consulted with Dr 03/14/2021, discussed pt last UTI was ecoli and susceptible to Macrobid, and pt had to be switch off Keflex due to resistant, choice of starting Macrobid today was made and will continue Q12 x14 days then will continue to take one a day until she delivers for prophylactics. Pt continues to have slight nausea she admits to having nauseas during this pregnancy, but also believes the abx are contributing, pt received dilaudid as well during her stay, and I am under the impression the dilaudid made her nauseas as well, prescriptions sent for Macrobid and zofran. Pt discharged home in stable condition.   Physical exam  Vitals:   01/11/21 2039 01/11/21 2345 01/12/21 0356 01/12/21 0732  BP: 125/62 (!) 102/45 127/72 (!) 109/59  Pulse: 91 80 92 80  Resp: 18 16    Temp: 98.3 F (36.8 C) 98.2 F (36.8 C) 99.2 F (37.3 C) 98.3 F (36.8 C)  TempSrc: Oral Oral Oral Oral  SpO2: 99% 99% 98% 99%  Weight:      Height:       Physical Exam Vitalsand nursing notereviewed.  Constitutional:  General: She is in no acute distress.  HENT:  Head: Normocephalicand atraumatic.  Pulmonary:  Effort: Pulmonary effort is normal. Norespiratory distress.  Abdominal:  Tenderness: There is pain. There is noright CVA tenderness.  Skin: General: Skin is warmand dry.  Neurological:  Mental Status: She is alert.  Psychiatric:  Mood and Affect: Moodnormal.  Behavior: Behaviornormal.  Labs: Lab Results  Component Value Date   WBC 8.0 01/11/2021   HGB 11.0 (L) 01/11/2021   HCT 33.4 (L) 01/11/2021   MCV 87.4 01/11/2021   PLT 291  01/11/2021   CMP Latest Ref Rng & Units 01/10/2021  Glucose 70 - 99 mg/dL 98  BUN 6 - 20 mg/dL 9  Creatinine 6.01 - 0.93 mg/dL 2.35  Sodium 573 - 220 mmol/L 133(L)  Potassium 3.5 - 5.1 mmol/L 3.9  Chloride 98 - 111 mmol/L 103  CO2 22 - 32 mmol/L 22  Calcium 8.9 - 10.3 mg/dL 8.3(L)  Total Protein 6.5 - 8.1 g/dL 6.5  Total Bilirubin 0.3 - 1.2 mg/dL 0.4  Alkaline Phos 38 - 126 U/L 48  AST 15 - 41 U/L 16  ALT 0 - 44 U/L 12    Date of discharge: 01/12/2021 Discharge Diagnoses: Pyelonephritis  Discharge instruction: per After Visit Summary and "Baby and Me Booklet".  After visit meds:   Activity:           unrestricted Advance as tolerated. Pelvic rest for 6 weeks.  Diet:                routine Medications: PNV and macrobid, zofran, tylenol Postpartum contraception: Not Discussed Condition:  Pt discharge to home with baby in stable and condition UTI: F/U with [facility] in one week. TOC UC needed.   Meds: Allergies as of 01/12/2021   No Active Allergies     Medication List    TAKE these medications   acetaminophen 500 MG tablet Commonly known as: TYLENOL Take 1 tablet (500 mg total) by mouth every 6 (six) hours as needed.   nitrofurantoin (macrocrystal-monohydrate) 100 MG capsule Commonly known as: MACROBID Take 1 capsule (100 mg total) by mouth every 12 (twelve) hours. Take 1 pill every 12 hours for next 14 days then one every day until delivered for prophylatics.   prenatal multivitamin Tabs tablet Take 1 tablet by mouth daily at 12 noon.   valACYclovir 1000 MG tablet Commonly known as: VALTREX Take 1,000 mg by mouth 2 (two) times daily.       Discharge Follow Up:   [name] Information    [facility]. Schedule an appointment as soon as possible for a visit in 1 week(s).   Specialty: Obstetrics and Gynecology Contact information: [address] [place] [address] [phone]               Beverly, NP-C, CNM 01/12/2021, 10:58 AM  Dale Stockton, FNP   Agree with discharge summary as documented above.  I saw and evaluated patient on day of discharge.  Leukocytosis resolved.  Mild left CVA tenderness but improved from previous exam. Vital signs unremarkable and patient afebrile since 3/5 at 1529.    Steva Ready, DO

## 2021-01-12 NOTE — Progress Notes (Signed)
Reviewed discharge antepartum instructions with patient regarding medications, when to call MD/go to MAU, signs of symptoms of pre-e, signs and symptoms of UTI, and to follow up as scheduled with OB in 1 week. Patient verbalized understanding of discharge antepartum instructions and asked appropriate questions.

## 2021-01-13 LAB — CULTURE, OB URINE: Culture: >=100000 — AB

## 2021-01-16 DIAGNOSIS — O2302 Infections of kidney in pregnancy, second trimester: Secondary | ICD-10-CM | POA: Diagnosis not present

## 2021-01-16 DIAGNOSIS — O368999 Maternal care for other specified fetal problems, unspecified trimester, other fetus: Secondary | ICD-10-CM | POA: Diagnosis not present

## 2021-01-16 DIAGNOSIS — Z3A19 19 weeks gestation of pregnancy: Secondary | ICD-10-CM | POA: Diagnosis not present

## 2021-01-16 DIAGNOSIS — Z3482 Encounter for supervision of other normal pregnancy, second trimester: Secondary | ICD-10-CM | POA: Diagnosis not present

## 2021-01-16 DIAGNOSIS — Z363 Encounter for antenatal screening for malformations: Secondary | ICD-10-CM | POA: Diagnosis not present

## 2021-02-14 DIAGNOSIS — N390 Urinary tract infection, site not specified: Secondary | ICD-10-CM | POA: Diagnosis not present

## 2021-02-14 DIAGNOSIS — Z3A23 23 weeks gestation of pregnancy: Secondary | ICD-10-CM | POA: Diagnosis not present

## 2021-02-14 DIAGNOSIS — O368999 Maternal care for other specified fetal problems, unspecified trimester, other fetus: Secondary | ICD-10-CM | POA: Diagnosis not present

## 2021-02-14 DIAGNOSIS — Z3492 Encounter for supervision of normal pregnancy, unspecified, second trimester: Secondary | ICD-10-CM | POA: Diagnosis not present

## 2021-03-08 ENCOUNTER — Inpatient Hospital Stay (HOSPITAL_COMMUNITY)
Admission: AD | Admit: 2021-03-08 | Discharge: 2021-03-08 | Disposition: A | Discharge status: Home / Self Care | Payer: 59 | Attending: Obstetrics and Gynecology | Admitting: Obstetrics and Gynecology

## 2021-03-08 ENCOUNTER — Other Ambulatory Visit: Payer: Self-pay

## 2021-03-08 ENCOUNTER — Encounter (HOSPITAL_COMMUNITY): Payer: Self-pay | Admitting: Obstetrics and Gynecology

## 2021-03-08 ENCOUNTER — Inpatient Hospital Stay (HOSPITAL_COMMUNITY): Payer: 59

## 2021-03-08 DIAGNOSIS — R1011 Right upper quadrant pain: Secondary | ICD-10-CM | POA: Insufficient documentation

## 2021-03-08 DIAGNOSIS — R110 Nausea: Secondary | ICD-10-CM | POA: Diagnosis not present

## 2021-03-08 DIAGNOSIS — Z3A26 26 weeks gestation of pregnancy: Secondary | ICD-10-CM | POA: Diagnosis not present

## 2021-03-08 DIAGNOSIS — O26892 Other specified pregnancy related conditions, second trimester: Secondary | ICD-10-CM | POA: Diagnosis not present

## 2021-03-08 DIAGNOSIS — Z3492 Encounter for supervision of normal pregnancy, unspecified, second trimester: Secondary | ICD-10-CM

## 2021-03-08 DIAGNOSIS — Z3A27 27 weeks gestation of pregnancy: Secondary | ICD-10-CM | POA: Diagnosis not present

## 2021-03-08 DIAGNOSIS — N858 Other specified noninflammatory disorders of uterus: Secondary | ICD-10-CM

## 2021-03-08 LAB — COMPREHENSIVE METABOLIC PANEL
ALT: 13 U/L (ref 0–44)
AST: 18 U/L (ref 15–41)
Albumin: 2.8 g/dL — ABNORMAL LOW (ref 3.5–5.0)
Alkaline Phosphatase: 61 U/L (ref 38–126)
Anion gap: 7 (ref 5–15)
BUN: 7 mg/dL (ref 6–20)
CO2: 23 mmol/L (ref 22–32)
Calcium: 8.5 mg/dL — ABNORMAL LOW (ref 8.9–10.3)
Chloride: 106 mmol/L (ref 98–111)
Creatinine, Ser: 0.66 mg/dL (ref 0.44–1.00)
GFR, Estimated: >60 mL/min (ref >60)
Glucose, Bld: 86 mg/dL (ref 70–99)
Potassium: 3.6 mmol/L (ref 3.5–5.1)
Sodium: 136 mmol/L (ref 135–145)
Total Bilirubin: 0.2 mg/dL — ABNORMAL LOW (ref 0.3–1.2)
Total Protein: 6 g/dL — ABNORMAL LOW (ref 6.5–8.1)

## 2021-03-08 LAB — CBC
HCT: 31 % — ABNORMAL LOW (ref 36.0–46.0)
Hemoglobin: 9.9 g/dL — ABNORMAL LOW (ref 12.0–15.0)
MCH: 28.7 pg (ref 26.0–34.0)
MCHC: 31.9 g/dL (ref 30.0–36.0)
MCV: 89.9 fL (ref 80.0–100.0)
Platelets: 332 10*3/uL (ref 150–400)
RBC: 3.45 MIL/uL — ABNORMAL LOW (ref 3.87–5.11)
RDW: 14.1 % (ref 11.5–15.5)
WBC: 12.8 10*3/uL — ABNORMAL HIGH (ref 4.0–10.5)
nRBC: 0 % (ref 0.0–0.2)

## 2021-03-08 LAB — URINALYSIS, ROUTINE W REFLEX MICROSCOPIC
Bilirubin Urine: NEGATIVE
Glucose, UA: NEGATIVE mg/dL
Hgb urine dipstick: NEGATIVE
Ketones, ur: NEGATIVE mg/dL
Nitrite: NEGATIVE
Protein, ur: NEGATIVE mg/dL
Specific Gravity, Urine: 1.016 (ref 1.005–1.030)
pH: 6 (ref 5.0–8.0)

## 2021-03-08 LAB — AMYLASE: Amylase: 43 U/L (ref 28–100)

## 2021-03-08 LAB — LIPASE, BLOOD: Lipase: 31 U/L (ref 11–51)

## 2021-03-08 MED ORDER — HYOSCYAMINE SULFATE SL 0.125 MG SL SUBL: 1.0000 | SUBLINGUAL_TABLET | Freq: Three times a day (TID) | SUBLINGUAL | 0 refills | Status: DC | PRN

## 2021-03-08 NOTE — Discharge Instructions (Signed)
Abdominal Pain During Pregnancy Abdominal pain is common during pregnancy and has many possible causes. Some causes are more serious than others, and sometimes the cause is not known. Abdominal pain can be a sign that labor is starting. It can also be caused by normal growth of your baby causing stretching of muscles and ligaments during pregnancy. Always tell your health care provider if you have any abdominal pain. Follow these instructions at home:  Do not have sex or put anything in your vagina until your pain goes away completely.  Get plenty of rest until your pain improves.  Drink enough fluid to keep your urine pale yellow.  Take over-the-counter and prescription medicines only as told by your health care provider.  Keep all follow-up visits. This is important.   Contact a health care provider if:  Your pain continues or gets worse after resting.  You have lower abdominal pain that: ? Comes and goes at regular intervals. ? Spreads to your back. ? Is similar to menstrual cramps.  You have pain or burning when you urinate. Get help right away if:  You have a fever, chills, or shortness of breath.  You have vaginal bleeding.  You are leaking fluid or passing tissue from your vagina.  You have vomiting or diarrhea that lasts for more than 24 hours.  Your baby is moving less than usual.  You feel very weak or faint.  You develop severe pain in your upper abdomen. Summary  Abdominal pain is common during pregnancy and has many possible causes.  If you experience abdominal pain during pregnancy, tell your health care provider right away.  Follow your health care provider's home care instructions and keep all follow-up visits as told. This information is not intended to replace advice given to you by your health care provider. Make sure you discuss any questions you have with your health care provider. Document Revised: 07/09/2020 Document Reviewed: 07/09/2020 Elsevier  Patient Education  2021 [employer]  Biliary Colic, Adult  Biliary colic is severe pain caused by a problem with the gallbladder. The gallbladder is a small organ in the upper right part of the abdomen. The gallbladder stores a digestive fluid produced in the liver (bile) that helps the body break down fat. Bile and other digestive enzymes are carried from the liver to the small intestine through tube-like structures called bile ducts. The gallbladder and the bile ducts form the biliary tract. Sometimes, hard deposits of digestive fluids (gallstones) form in the gallbladder and block the flow of bile from the gallbladder, causing biliary colic. This condition is also called a gallbladder attack. Gallstones can be as small as a grain of sand or as big as a golf ball. There could be just one gallstone in the gallbladder, or there could be many. What are the causes? This condition is usually caused by gallstones. Less often, a tumor could block the flow of bile from the gallbladder and trigger biliary colic. What increases the risk? The following factors may make you more likely to develop this condition:  Being female.  Having a family history of gallstones.  Being obese.  Losing weight suddenly or quickly.  Eating a diet that is high in calories, low in fiber, and rich in refined carbohydrates, such as white bread and white rice.  Having certain health conditions, such as: ? An intestinal disease that affects nutrient absorption, such as Crohn's disease. ? A metabolic condition, such as diabetes or metabolic syndrome. Metabolic syndrome occurs when  someone has high blood pressure, high cholesterol, and diabetes. ? A blood condition, such as hemolytic anemia or sickle cell disease. What are the signs or symptoms? The main symptom of this condition is severe pain in the upper right side of the abdomen. You may feel this pain below the chest but above the hip. This pain often occurs at night  or after eating a meal that is high in fat. This pain may get worse for up to an hour and last as long as 12 hours. In most cases, the pain fades (subsides) within 2 hours. Other symptoms of this condition include:  Nausea and vomiting.  Pain under the right shoulder. How is this diagnosed? This condition is diagnosed based on your medical history, your symptoms, and a physical exam. You may also have tests, including:  Blood tests to rule out infection or inflammation of the bile ducts, gallbladder, pancreas, or liver.  Imaging studies, such as: ? An ultrasound. ? A CT scan. ? An MRI. In some cases, you may need to have an imaging study done using a small amount of radioactive material (nuclear medicine) to confirm the diagnosis. How is this treated? This condition may be treated with medicines to:  Relieve your pain or nausea.  Dissolve the gallstones. It may take months or years before the gallstones are completely gone. If you have gallstones, or if you have a tumor in the gallbladder that is causing biliary colic, you may need surgery to remove the gallbladder (cholecystectomy). Follow these instructions at home: Eating and drinking  Drink enough fluid to keep your urine pale yellow.  Follow instructions from your health care provider about eating or drinking restrictions. These may include avoiding: ? Fatty, greasy, and fried foods. ? Any foods that make the pain worse. ? Overeating. ? Having a large meal after not eating for a while. General instructions  Take over-the-counter and prescription medicines only as told by your health care provider.  Keep all follow-up visits as told by your health care provider. This is important. How is this prevented? Steps to prevent this condition include:  Maintaining a healthy body weight.  Getting regular exercise.  Eating a healthy diet that is high in fiber and low in fat.  Limiting how much sugar and refined carbohydrates  you eat. Contact a health care provider if:  Your pain lasts more than 5 hours.  You vomit.  You have a fever and chills.  Your pain gets worse. Get help right away if:  Your skin or the whites of your eyes look yellow (jaundice).  Your have tea-colored urine and light-colored stools (feces).  You are dizzy or you faint. Summary  Biliary colic is severe pain caused by a problem with the gallbladder. The gallbladder is a small organ in the upper right part of your abdomen.  Treatment for this condition may include medicine to relieve your pain or nausea, or medicine to slowly dissolve the gallstones.  If you have gallstones, or if you have a tumor in the gallbladder that is causing biliary colic, you may need surgery to remove the gallbladder (cholecystectomy). This information is not intended to replace advice given to you by your health care provider. Make sure you discuss any questions you have with your health care provider. Document Revised: 11/07/2019 Document Reviewed: 08/29/2019 Elsevier Patient Education  2021 [employer].

## 2021-03-08 NOTE — MAU Provider Note (Incomplete Revision)
History     CSN: [id number]  Arrival date and time: 03/08/21 1854   Event Date/Time   First Provider Initiated Contact with Patient 03/08/21 2006      Chief Complaint  Patient presents with  . Abdominal Pain   HPI Peggy Mckenzie is a 30 y.o. [phone] at [redacted]w[redacted]d who presents with right upper quadrant pain for the last 2 days. She states it is sharp upper abdominal pain that is constant but sometimes improves. She reports it is worse after she eats and feels like it comes in waves about 45 minutes after eating. She reports feeling nauseous but no vomiting and had diarrhea yesterday. She denies any contractions, vaginal bleeding or discharge. Reports normal fetal movement.   OB History    Gravida  5   Para  3   Term  3   Preterm      AB  1   Living  3     SAB  1   IAB      Ectopic      Multiple  0   Live Births  3           Past Medical History:  Diagnosis Date  . Bipolar 1 disorder (HCC)   . Depression   . Headache   . Infection    frequent UTI's  . Pyelonephritis during pregnancy 2021    Past Surgical History:  Procedure Laterality Date  . CYST REMOVAL HAND    . FRACTURE SURGERY Left    leg    Family History  Problem Relation Age of Onset  . Birth defects Paternal Aunt        spina bifida  . Hypertension Paternal Grandmother     Social History   Tobacco Use  . Smoking status: Never Smoker  . Smokeless tobacco: Never Used  Substance Use Topics  . Alcohol use: No    Comment: occasional,, not now  . Drug use: No    Allergies: No Active Allergies  Medications Prior to Admission  Medication Sig Dispense Refill Last Dose  . nitrofurantoin, macrocrystal-monohydrate, (MACROBID) 100 MG capsule Take 1 capsule (100 mg total) by mouth every 12 (twelve) hours. Take 1 pill every 12 hours for next 14 days then one every day until delivered for prophylatics. 60 capsule 2 03/07/2021 at Unknown time  . Prenatal Vit-Fe Fumarate-FA (PRENATAL  MULTIVITAMIN) TABS tablet Take 1 tablet by mouth daily at 12 noon.   03/07/2021 at Unknown time  . valACYclovir (VALTREX) 1000 MG tablet TAKE 1 TABLET BY MOUTH ONCE A DAY 240 tablet 0 03/07/2021 at Unknown time  . acetaminophen (TYLENOL) 500 MG tablet Take 1 tablet (500 mg total) by mouth every 6 (six) hours as needed. 30 tablet 0   . ondansetron (ZOFRAN) 4 MG tablet Take 1 tablet (4 mg total) by mouth every 8 (eight) hours as needed for nausea or vomiting. 20 tablet 0   . valACYclovir (VALTREX) 1000 MG tablet Take 1,000 mg by mouth 2 (two) times daily.       Review of Systems  Constitutional: Negative.  Negative for fatigue and fever.  HENT: Negative.   Respiratory: Negative.  Negative for shortness of breath.   Cardiovascular: Negative.  Negative for chest pain.  Gastrointestinal: Positive for abdominal pain, diarrhea and nausea. Negative for constipation and vomiting.  Genitourinary: Negative.  Negative for dysuria, vaginal bleeding and vaginal discharge.  Neurological: Negative.  Negative for dizziness and headaches.   Physical Exam   Blood pressure  128/68, pulse 85, temperature 98.5 F (36.9 C), resp. rate 16, height 5\' 3"  (1.6 m), weight 103 kg, last menstrual period 09/01/2020, SpO2 100 %, unknown if currently breastfeeding.  Physical Exam Vitals and nursing note reviewed.  Constitutional:      General: She is not in acute distress.    Appearance: She is well-developed.  HENT:     Head: Normocephalic.  Eyes:     Pupils: Pupils are equal, round, and reactive to light.  Cardiovascular:     Rate and Rhythm: Normal rate and regular rhythm.     Heart sounds: Normal heart sounds.  Pulmonary:     Effort: Pulmonary effort is normal. No respiratory distress.     Breath sounds: Normal breath sounds.  Abdominal:     General: Bowel sounds are normal. There is no distension.     Palpations: Abdomen is soft.     Tenderness: There is abdominal tenderness in the right upper quadrant and  epigastric area.  Skin:    General: Skin is warm and dry.  Neurological:     Mental Status: She is alert and oriented to person, place, and time.  Psychiatric:        Behavior: Behavior normal.        Thought Content: Thought content normal.        Judgment: Judgment normal.     Fetal Tracing:  Baseline: 135 Variability: moderate Accels: 10x10 Decels: none  Toco: none   MAU Course  Procedures Results for orders placed or performed during the hospital encounter of 03/08/21 (from the past 24 hour(s))  Urinalysis, Routine w reflex microscopic     Status: Abnormal   Collection Time: 03/08/21  7:30 PM  Result Value Ref Range   Color, Urine YELLOW YELLOW   APPearance HAZY (A) CLEAR   Specific Gravity, Urine 1.016 1.005 - 1.030   pH 6.0 5.0 - 8.0   Glucose, UA NEGATIVE NEGATIVE mg/dL   Hgb urine dipstick NEGATIVE NEGATIVE   Bilirubin Urine NEGATIVE NEGATIVE   Ketones, ur NEGATIVE NEGATIVE mg/dL   Protein, ur NEGATIVE NEGATIVE mg/dL   Nitrite NEGATIVE NEGATIVE   Leukocytes,Ua TRACE (A) NEGATIVE   RBC / HPF 0-5 0 - 5 RBC/hpf   WBC, UA 0-5 0 - 5 WBC/hpf   Bacteria, UA FEW (A) NONE SEEN   Squamous Epithelial / LPF 11-20 0 - 5   Mucus PRESENT    Hyaline Casts, UA PRESENT   CBC     Status: Abnormal   Collection Time: 03/08/21  7:50 PM  Result Value Ref Range   WBC 12.8 (H) 4.0 - 10.5 K/uL   RBC 3.45 (L) 3.87 - 5.11 MIL/uL   Hemoglobin 9.9 (L) 12.0 - 15.0 g/dL   HCT 03/10/21 (L) 42.7 - 06.2 %   MCV 89.9 80.0 - 100.0 fL   MCH 28.7 26.0 - 34.0 pg   MCHC 31.9 30.0 - 36.0 g/dL   RDW 37.6 28.3 - 15.1 %   Platelets 332 150 - 400 K/uL   nRBC 0.0 0.0 - 0.2 %     MDM UA CBC, CMP, Amylase, Lipase 76.1 RUQ  Care turned over to [facility]. Solmon Bohr CNM at 2100. 2101, CNM 03/08/21   Assessment and Plan

## 2021-03-08 NOTE — MAU Provider Note (Addendum)
History     CSN: [id number]  Arrival date and time: 03/08/21 1854   Event Date/Time   First Provider Initiated Contact with Patient 03/08/21 2006      Chief Complaint  Patient presents with  . Abdominal Pain   HPI Peggy Mckenzie is a 30 y.o. [phone] at [redacted]w[redacted]d who presents with right upper quadrant pain for the last 2 days. She states it is sharp upper abdominal pain that is constant but sometimes improves. She reports it is worse after she eats and feels like it comes in waves about 45 minutes after eating. She reports feeling nauseous but no vomiting and had diarrhea yesterday. She denies any contractions, vaginal bleeding or discharge. Reports normal fetal movement.   OB History    Gravida  5   Para  3   Term  3   Preterm      AB  1   Living  3     SAB  1   IAB      Ectopic      Multiple  0   Live Births  3           Past Medical History:  Diagnosis Date  . Bipolar 1 disorder (HCC)   . Depression   . Headache   . Infection    frequent UTI's  . Pyelonephritis during pregnancy 2021    Past Surgical History:  Procedure Laterality Date  . CYST REMOVAL HAND    . FRACTURE SURGERY Left    leg    Family History  Problem Relation Age of Onset  . Birth defects Paternal Aunt        spina bifida  . Hypertension Paternal Grandmother     Social History   Tobacco Use  . Smoking status: Never Smoker  . Smokeless tobacco: Never Used  Substance Use Topics  . Alcohol use: No    Comment: occasional,, not now  . Drug use: No    Allergies: No Active Allergies  Medications Prior to Admission  Medication Sig Dispense Refill Last Dose  . nitrofurantoin, macrocrystal-monohydrate, (MACROBID) 100 MG capsule Take 1 capsule (100 mg total) by mouth every 12 (twelve) hours. Take 1 pill every 12 hours for next 14 days then one every day until delivered for prophylatics. 60 capsule 2 03/07/2021 at Unknown time  . Prenatal Vit-Fe Fumarate-FA (PRENATAL  MULTIVITAMIN) TABS tablet Take 1 tablet by mouth daily at 12 noon.   03/07/2021 at Unknown time  . valACYclovir (VALTREX) 1000 MG tablet TAKE 1 TABLET BY MOUTH ONCE A DAY 240 tablet 0 03/07/2021 at Unknown time  . acetaminophen (TYLENOL) 500 MG tablet Take 1 tablet (500 mg total) by mouth every 6 (six) hours as needed. 30 tablet 0   . ondansetron (ZOFRAN) 4 MG tablet Take 1 tablet (4 mg total) by mouth every 8 (eight) hours as needed for nausea or vomiting. 20 tablet 0   . valACYclovir (VALTREX) 1000 MG tablet Take 1,000 mg by mouth 2 (two) times daily.       Review of Systems  Constitutional: Negative.  Negative for fatigue and fever.  HENT: Negative.   Respiratory: Negative.  Negative for shortness of breath.   Cardiovascular: Negative.  Negative for chest pain.  Gastrointestinal: Positive for abdominal pain, diarrhea and nausea. Negative for constipation and vomiting.  Genitourinary: Negative.  Negative for dysuria, vaginal bleeding and vaginal discharge.  Neurological: Negative.  Negative for dizziness and headaches.   Physical Exam   Blood pressure  128/68, pulse 85, temperature 98.5 F (36.9 C), resp. rate 16, height 5\' 3"  (1.6 m), weight 103 kg, last menstrual period 09/01/2020, SpO2 100 %, unknown if currently breastfeeding.  Physical Exam Vitals and nursing note reviewed.  Constitutional:      General: She is not in acute distress.    Appearance: She is well-developed.  HENT:     Head: Normocephalic.  Eyes:     Pupils: Pupils are equal, round, and reactive to light.  Cardiovascular:     Rate and Rhythm: Normal rate and regular rhythm.     Heart sounds: Normal heart sounds.  Pulmonary:     Effort: Pulmonary effort is normal. No respiratory distress.     Breath sounds: Normal breath sounds.  Abdominal:     General: Bowel sounds are normal. There is no distension.     Palpations: Abdomen is soft.     Tenderness: There is abdominal tenderness in the right upper quadrant and  epigastric area.  Skin:    General: Skin is warm and dry.  Neurological:     Mental Status: She is alert and oriented to person, place, and time.  Psychiatric:        Behavior: Behavior normal.        Thought Content: Thought content normal.        Judgment: Judgment normal.     Fetal Tracing:  Baseline: 135 Variability: moderate Accels: 10x10 Decels: none  Toco: none   MAU Course  Procedures Results for orders placed or performed during the hospital encounter of 03/08/21 (from the past 24 hour(s))  Urinalysis, Routine w reflex microscopic     Status: Abnormal   Collection Time: 03/08/21  7:30 PM  Result Value Ref Range   Color, Urine YELLOW YELLOW   APPearance HAZY (A) CLEAR   Specific Gravity, Urine 1.016 1.005 - 1.030   pH 6.0 5.0 - 8.0   Glucose, UA NEGATIVE NEGATIVE mg/dL   Hgb urine dipstick NEGATIVE NEGATIVE   Bilirubin Urine NEGATIVE NEGATIVE   Ketones, ur NEGATIVE NEGATIVE mg/dL   Protein, ur NEGATIVE NEGATIVE mg/dL   Nitrite NEGATIVE NEGATIVE   Leukocytes,Ua TRACE (A) NEGATIVE   RBC / HPF 0-5 0 - 5 RBC/hpf   WBC, UA 0-5 0 - 5 WBC/hpf   Bacteria, UA FEW (A) NONE SEEN   Squamous Epithelial / LPF 11-20 0 - 5   Mucus PRESENT    Hyaline Casts, UA PRESENT   CBC     Status: Abnormal   Collection Time: 03/08/21  7:50 PM  Result Value Ref Range   WBC 12.8 (H) 4.0 - 10.5 K/uL   RBC 3.45 (L) 3.87 - 5.11 MIL/uL   Hemoglobin 9.9 (L) 12.0 - 15.0 g/dL   HCT 03/10/21 (L) 36.1 - 44.3 %   MCV 89.9 80.0 - 100.0 fL   MCH 28.7 26.0 - 34.0 pg   MCHC 31.9 30.0 - 36.0 g/dL   RDW 15.4 00.8 - 67.6 %   Platelets 332 150 - 400 K/uL   nRBC 0.0 0.0 - 0.2 %     MDM UA CBC, CMP, Amylase, Lipase 19.5 RUQ  Care turned over to [facility]. Tonisha Silvey CNM at 2100. 2101, CNM 03/08/21 Results for orders placed or performed during the hospital encounter of 03/08/21 (from the past 24 hour(s))  Urinalysis, Routine w reflex microscopic     Status: Abnormal   Collection Time: 03/08/21   7:30 PM  Result Value Ref Range   Color, Urine YELLOW  YELLOW   APPearance HAZY (A) CLEAR   Specific Gravity, Urine 1.016 1.005 - 1.030   pH 6.0 5.0 - 8.0   Glucose, UA NEGATIVE NEGATIVE mg/dL   Hgb urine dipstick NEGATIVE NEGATIVE   Bilirubin Urine NEGATIVE NEGATIVE   Ketones, ur NEGATIVE NEGATIVE mg/dL   Protein, ur NEGATIVE NEGATIVE mg/dL   Nitrite NEGATIVE NEGATIVE   Leukocytes,Ua TRACE (A) NEGATIVE   RBC / HPF 0-5 0 - 5 RBC/hpf   WBC, UA 0-5 0 - 5 WBC/hpf   Bacteria, UA FEW (A) NONE SEEN   Squamous Epithelial / LPF 11-20 0 - 5   Mucus PRESENT    Hyaline Casts, UA PRESENT   CBC     Status: Abnormal   Collection Time: 03/08/21  7:50 PM  Result Value Ref Range   WBC 12.8 (H) 4.0 - 10.5 K/uL   RBC 3.45 (L) 3.87 - 5.11 MIL/uL   Hemoglobin 9.9 (L) 12.0 - 15.0 g/dL   HCT 22.9 (L) 79.8 - 92.1 %   MCV 89.9 80.0 - 100.0 fL   MCH 28.7 26.0 - 34.0 pg   MCHC 31.9 30.0 - 36.0 g/dL   RDW 19.4 17.4 - 08.1 %   Platelets 332 150 - 400 K/uL   nRBC 0.0 0.0 - 0.2 %  Comprehensive metabolic panel     Status: Abnormal   Collection Time: 03/08/21  7:50 PM  Result Value Ref Range   Sodium 136 135 - 145 mmol/L   Potassium 3.6 3.5 - 5.1 mmol/L   Chloride 106 98 - 111 mmol/L   CO2 23 22 - 32 mmol/L   Glucose, Bld 86 70 - 99 mg/dL   BUN 7 6 - 20 mg/dL   Creatinine, Ser 4.48 0.44 - 1.00 mg/dL   Calcium 8.5 (L) 8.9 - 10.3 mg/dL   Total Protein 6.0 (L) 6.5 - 8.1 g/dL   Albumin 2.8 (L) 3.5 - 5.0 g/dL   AST 18 15 - 41 U/L   ALT 13 0 - 44 U/L   Alkaline Phosphatase 61 38 - 126 U/L   Total Bilirubin 0.2 (L) 0.3 - 1.2 mg/dL   GFR, Estimated >18 >56 mL/min   Anion gap 7 5 - 15  Amylase     Status: None   Collection Time: 03/08/21  7:50 PM  Result Value Ref Range   Amylase 43 28 - 100 U/L  Lipase, blood     Status: None   Collection Time: 03/08/21  7:50 PM  Result Value Ref Range   Lipase 31 11 - 51 U/L   US Abdomen Limited RUQ (LIVER/GB)  Result Date: 03/08/2021 CLINICAL DATA:  Right  upper quadrant pain and nausea. EXAM: ULTRASOUND ABDOMEN LIMITED RIGHT UPPER QUADRANT COMPARISON:  None. FINDINGS: Gallbladder: No gallstones or wall thickening visualized (1.8 mm). No sonographic Murphy sign noted by sonographer. Common bile duct: Diameter: 3.0 mm Liver: No focal lesion identified. Within normal limits in parenchymal echogenicity. Portal vein is patent on color Doppler imaging with normal direction of blood flow towards the liver. Other: None. IMPRESSION: Normal right upper quadrant ultrasound. Electronically Signed   By: Aram Candela M.D.   On: 03/08/2021 21:30   Discussed results and that they are normal Discussed this could be biliary colic or possibly just intestinal cramping with eating Will rx Levsin for prn use for now.   Assessment and Plan  A:  Single IUP at [redacted]w[redacted]d      RUQ pain post prandial  Uterine irritability  P:   Discharge home        Push fluids, eat bland diet for now        Avoid high fat foods        Rx Levsin for crampy pain       Encouraged to return if she develops worsening of symptoms, increase in pain, fever, or other concerning symptoms.    Aviva Signs, CNM

## 2021-03-08 NOTE — MAU Note (Addendum)
Ate lunch yesterday and about an hour later started having sharp pain RUQ. Pain does not go thru to back. Some nausea. Gradually got duller throughout the evening. Today after bkf pain started again and then gradually went away. Pain is worse today with movement like getting up and also alittle tender to touch. Usually happens about after eating. Slight nausea. No vomiting. Had diarrhea after dinner last night but normal BM today. No fever or chills.

## 2021-03-12 DIAGNOSIS — R609 Edema, unspecified: Secondary | ICD-10-CM | POA: Diagnosis not present

## 2021-03-12 DIAGNOSIS — M5489 Other dorsalgia: Secondary | ICD-10-CM | POA: Diagnosis not present

## 2021-03-17 DIAGNOSIS — Z3492 Encounter for supervision of normal pregnancy, unspecified, second trimester: Secondary | ICD-10-CM | POA: Diagnosis not present

## 2021-04-25 DIAGNOSIS — Z3483 Encounter for supervision of other normal pregnancy, third trimester: Secondary | ICD-10-CM | POA: Diagnosis not present

## 2021-04-25 DIAGNOSIS — Z3482 Encounter for supervision of other normal pregnancy, second trimester: Secondary | ICD-10-CM | POA: Diagnosis not present

## 2021-05-08 ENCOUNTER — Encounter (HOSPITAL_COMMUNITY): Payer: Self-pay | Admitting: Obstetrics and Gynecology

## 2021-05-08 ENCOUNTER — Observation Stay (EMERGENCY_DEPARTMENT_HOSPITAL)
Admission: AD | Admit: 2021-05-08 | Discharge: 2021-05-09 | Disposition: A | Discharge status: Home / Self Care | Payer: 59 | Source: Home / Self Care | Attending: Obstetrics and Gynecology | Admitting: Obstetrics and Gynecology

## 2021-05-08 ENCOUNTER — Other Ambulatory Visit: Payer: Self-pay

## 2021-05-08 DIAGNOSIS — O0990 Supervision of high risk pregnancy, unspecified, unspecified trimester: Secondary | ICD-10-CM | POA: Diagnosis not present

## 2021-05-08 DIAGNOSIS — Z3689 Encounter for other specified antenatal screening: Secondary | ICD-10-CM | POA: Diagnosis not present

## 2021-05-08 DIAGNOSIS — O43123 Velamentous insertion of umbilical cord, third trimester: Secondary | ICD-10-CM | POA: Diagnosis not present

## 2021-05-08 DIAGNOSIS — A6000 Herpesviral infection of urogenital system, unspecified: Secondary | ICD-10-CM | POA: Diagnosis not present

## 2021-05-08 DIAGNOSIS — R519 Headache, unspecified: Secondary | ICD-10-CM | POA: Diagnosis not present

## 2021-05-08 DIAGNOSIS — Z3A35 35 weeks gestation of pregnancy: Secondary | ICD-10-CM

## 2021-05-08 DIAGNOSIS — Z23 Encounter for immunization: Secondary | ICD-10-CM | POA: Diagnosis not present

## 2021-05-08 DIAGNOSIS — O1493 Unspecified pre-eclampsia, third trimester: Secondary | ICD-10-CM | POA: Diagnosis not present

## 2021-05-08 DIAGNOSIS — O9902 Anemia complicating childbirth: Secondary | ICD-10-CM | POA: Diagnosis not present

## 2021-05-08 DIAGNOSIS — O139 Gestational [pregnancy-induced] hypertension without significant proteinuria, unspecified trimester: Secondary | ICD-10-CM | POA: Diagnosis present

## 2021-05-08 DIAGNOSIS — Z3A36 36 weeks gestation of pregnancy: Secondary | ICD-10-CM | POA: Diagnosis not present

## 2021-05-08 DIAGNOSIS — O9832 Other infections with a predominantly sexual mode of transmission complicating childbirth: Secondary | ICD-10-CM | POA: Diagnosis not present

## 2021-05-08 DIAGNOSIS — O133 Gestational [pregnancy-induced] hypertension without significant proteinuria, third trimester: Secondary | ICD-10-CM | POA: Insufficient documentation

## 2021-05-08 DIAGNOSIS — Z3492 Encounter for supervision of normal pregnancy, unspecified, second trimester: Secondary | ICD-10-CM

## 2021-05-08 DIAGNOSIS — O1414 Severe pre-eclampsia complicating childbirth: Secondary | ICD-10-CM | POA: Diagnosis not present

## 2021-05-08 DIAGNOSIS — O99824 Streptococcus B carrier state complicating childbirth: Secondary | ICD-10-CM | POA: Diagnosis not present

## 2021-05-08 DIAGNOSIS — Z20822 Contact with and (suspected) exposure to covid-19: Secondary | ICD-10-CM | POA: Insufficient documentation

## 2021-05-08 LAB — COMPREHENSIVE METABOLIC PANEL
ALT: 12 U/L (ref 0–44)
AST: 18 U/L (ref 15–41)
Albumin: 2.7 g/dL — ABNORMAL LOW (ref 3.5–5.0)
Alkaline Phosphatase: 90 U/L (ref 38–126)
Anion gap: 11 (ref 5–15)
BUN: 9 mg/dL (ref 6–20)
CO2: 20 mmol/L — ABNORMAL LOW (ref 22–32)
Calcium: 9.4 mg/dL (ref 8.9–10.3)
Chloride: 103 mmol/L (ref 98–111)
Creatinine, Ser: 0.68 mg/dL (ref 0.44–1.00)
GFR, Estimated: >60 mL/min (ref >60)
Glucose, Bld: 132 mg/dL — ABNORMAL HIGH (ref 70–99)
Potassium: 3.7 mmol/L (ref 3.5–5.1)
Sodium: 134 mmol/L — ABNORMAL LOW (ref 135–145)
Total Bilirubin: 0.2 mg/dL — ABNORMAL LOW (ref 0.3–1.2)
Total Protein: 5.8 g/dL — ABNORMAL LOW (ref 6.5–8.1)

## 2021-05-08 LAB — PROTEIN / CREATININE RATIO, URINE
Creatinine, Urine: 56.13 mg/dL
Protein Creatinine Ratio: 0.18 mg/mg{Cre} — ABNORMAL HIGH (ref 0.00–0.15)
Total Protein, Urine: 10 mg/dL

## 2021-05-08 LAB — CBC
HCT: 30.5 % — ABNORMAL LOW (ref 36.0–46.0)
Hemoglobin: 9.8 g/dL — ABNORMAL LOW (ref 12.0–15.0)
MCH: 26.9 pg (ref 26.0–34.0)
MCHC: 32.1 g/dL (ref 30.0–36.0)
MCV: 83.8 fL (ref 80.0–100.0)
Platelets: 310 10*3/uL (ref 150–400)
RBC: 3.64 MIL/uL — ABNORMAL LOW (ref 3.87–5.11)
RDW: 15.3 % (ref 11.5–15.5)
WBC: 10.9 10*3/uL — ABNORMAL HIGH (ref 4.0–10.5)
nRBC: 0 % (ref 0.0–0.2)

## 2021-05-08 MED ORDER — POLYSACCHARIDE IRON COMPLEX 150 MG PO CAPS
150.0000 mg | ORAL_CAPSULE | Freq: Every day | ORAL | Status: DC
  Administered 2021-05-08 – 2021-05-09 (×2): 150 mg via ORAL
  Filled 2021-05-08 (×3): qty 1

## 2021-05-08 MED ORDER — PRENATAL MULTIVITAMIN CH
1.0000 | ORAL_TABLET | Freq: Every day | ORAL | Status: DC
  Administered 2021-05-09: 1 via ORAL
  Filled 2021-05-08: qty 1

## 2021-05-08 MED ORDER — BUTALBITAL-APAP-CAFFEINE 50-325-40 MG PO TABS
2.0000 | ORAL_TABLET | Freq: Four times a day (QID) | ORAL | Status: DC | PRN
  Administered 2021-05-08: 2 via ORAL
  Filled 2021-05-08: qty 2

## 2021-05-08 MED ORDER — DOCUSATE SODIUM 100 MG PO CAPS
100.0000 mg | ORAL_CAPSULE | Freq: Every day | ORAL | Status: DC
  Administered 2021-05-09: 100 mg via ORAL
  Filled 2021-05-08: qty 1

## 2021-05-08 MED ORDER — CALCIUM CARBONATE ANTACID 500 MG PO CHEW: 2.0000 | CHEWABLE_TABLET | ORAL | Status: DC | PRN

## 2021-05-08 MED ORDER — MAGNESIUM OXIDE -MG SUPPLEMENT 400 (240 MG) MG PO TABS
400.0000 mg | ORAL_TABLET | Freq: Every day | ORAL | Status: DC
  Administered 2021-05-09 (×2): 400 mg via ORAL
  Filled 2021-05-08 (×2): qty 1

## 2021-05-08 MED ORDER — ZOLPIDEM TARTRATE 5 MG PO TABS
5.0000 mg | ORAL_TABLET | Freq: Every evening | ORAL | Status: DC | PRN
Start: 2021-05-08 — End: 2021-05-09
  Administered 2021-05-09: 5 mg via ORAL
  Filled 2021-05-08: qty 1

## 2021-05-08 NOTE — MAU Provider Note (Signed)
Chief Complaint:  Hypertension   Event Date/Time   First Provider Initiated Contact with Patient 05/08/21 2135     HPI: Peggy Mckenzie is a 30 y.o. [id number] at 77w4dwho presents to maternity admissions reporting swelling, headache and visual changes (blurry at times, some spots in vision).  Has had some elevated BPs. . She reports good fetal movement, denies LOF, vaginal bleeding, vaginal itching/burning, urinary symptoms, h/a, dizziness, n/v, diarrhea, constipation or fever/chills.  She denies RUQ abdominal pain.  Hypertension This is a new problem. The current episode started today. The problem is unchanged. Associated symptoms include blurred vision, headaches and peripheral edema. Pertinent negatives include no chest pain, malaise/fatigue or shortness of breath. There are no associated agents to hypertension. There are no known risk factors for coronary artery disease. Past treatments include nothing. There are no compliance problems.    RN Note: Pt reports she has been having increased swelling the past few days.  Took B/P yesterday and it was normal. To 150'/90's . C/O slight headache and some nausea and seeing some spots in her vision. Good fetal movement felt  Past Medical History: Past Medical History:  Diagnosis Date   Bipolar 1 disorder (HCC)    Depression    Headache    Infection    frequent UTI's   Pyelonephritis during pregnancy 2021    Past obstetric history: OB History  Gravida Para Term Preterm AB Living  5 3 3   1 3   SAB IAB Ectopic Multiple Live Births  1     0 3    # Outcome Date GA Lbr Len/2nd Weight Sex Delivery Anes PTL Lv  5 Current           4 Term 08/21/16 [redacted]w[redacted]d 01:48 / 00:14 3435 g F Vag-Spont EPI  LIV  3 Term 10/18/14 [redacted]w[redacted]d 03:58 / 00:30 3570 g M Vag-Spont EPI  LIV     Birth Comments: None  2 SAB 01/2014          1 Term 12/14/09    M Vag-Spont EPI  LIV    Past Surgical History: Past Surgical History:  Procedure Laterality Date   CYST REMOVAL  HAND     FRACTURE SURGERY Left    leg    Family History: Family History  Problem Relation Age of Onset   Birth defects Paternal Aunt        spina bifida   Hypertension Paternal Grandmother     Social History: Social History   Tobacco Use   Smoking status: Never   Smokeless tobacco: Never  Substance Use Topics   Alcohol use: No    Comment: occasional,, not now   Drug use: No    Allergies: No Active Allergies  Meds:  Medications Prior to Admission  Medication Sig Dispense Refill Last Dose   acetaminophen (TYLENOL) 500 MG tablet Take 1 tablet (500 mg total) by mouth every 6 (six) hours as needed. 30 tablet 0    Hyoscyamine Sulfate SL (LEVSIN/SL) 0.125 MG SUBL Place 1 tablet under the tongue every 8 (eight) hours as needed (abdominal cramping). 20 tablet 0    nitrofurantoin, macrocrystal-monohydrate, (MACROBID) 100 MG capsule Take 1 capsule (100 mg total) by mouth every 12 (twelve) hours. Take 1 pill every 12 hours for next 14 days then one every day until delivered for prophylatics. 60 capsule 2    ondansetron (ZOFRAN) 4 MG tablet Take 1 tablet (4 mg total) by mouth every 8 (eight) hours as needed for nausea or  vomiting. 20 tablet 0    Prenatal Vit-Fe Fumarate-FA (PRENATAL MULTIVITAMIN) TABS tablet Take 1 tablet by mouth daily at 12 noon.      valACYclovir (VALTREX) 1000 MG tablet Take 1,000 mg by mouth 2 (two) times daily.      valACYclovir (VALTREX) 1000 MG tablet TAKE 1 TABLET BY MOUTH ONCE A DAY 240 tablet 0     I have reviewed patient's Past Medical Hx, Surgical Hx, Family Hx, Social Hx, medications and allergies.   ROS:  Review of Systems  Constitutional:  Negative for malaise/fatigue.  Eyes:  Positive for blurred vision.  Respiratory:  Negative for shortness of breath.   Cardiovascular:  Negative for chest pain.  Neurological:  Positive for headaches.  Other systems negative  Physical Exam  Patient Vitals for the past 24 hrs:  BP Temp Pulse Resp Height Weight   05/08/21 2130 (!) 159/87 98.9 F (37.2 C) 96 18 5\' 3"  (1.6 m) --  05/08/21 2125 -- -- -- -- -- 109.8 kg   Vitals:   05/08/21 2125 05/08/21 2130 05/08/21 2155 05/08/21 2200  BP:  (!) 159/87 (!) 152/82 (!) 148/90  Pulse:  96 91 93  Resp:  18    Temp:  98.9 F (37.2 C)    SpO2:   99% 99%  Weight: 109.8 kg     Height:  5\' 3"  (1.6 m)     Constitutional: Well-developed, well-nourished female in no acute distress.  Cardiovascular: normal rate and rhythm Respiratory: normal effort, clear to auscultation bilaterally GI: Abd soft, non-tender, gravid appropriate for gestational age.   No rebound or guarding. MS: Extremities nontender, 2+ pedal/pretibial edema, normal ROM Neurologic: Alert and oriented x 4. DTRs 2+ with no clonus GU: Neg CVAT.  PELVIC EXAM:    FHT:  Baseline 140 , moderate variability, accelerations present, no decelerations Contractions: Irregular    Labs: Results for orders placed or performed during the hospital encounter of 05/08/21 (from the past 24 hour(s))  Protein / creatinine ratio, urine     Status: Abnormal   Collection Time: 05/08/21  9:35 PM  Result Value Ref Range   Creatinine, Urine 56.13 mg/dL   Total Protein, Urine 10 mg/dL   Protein Creatinine Ratio 0.18 (H) 0.00 - 0.15 mg/mg[Cre]  CBC     Status: Abnormal   Collection Time: 05/08/21 10:04 PM  Result Value Ref Range   WBC 10.9 (H) 4.0 - 10.5 K/uL   RBC 3.64 (L) 3.87 - 5.11 MIL/uL   Hemoglobin 9.8 (L) 12.0 - 15.0 g/dL   HCT 05/10/21 (L) 05/10/21 - 38.2 %   MCV 83.8 80.0 - 100.0 fL   MCH 26.9 26.0 - 34.0 pg   MCHC 32.1 30.0 - 36.0 g/dL   RDW 50.5 39.7 - 67.3 %   Platelets 310 150 - 400 K/uL   nRBC 0.0 0.0 - 0.2 %  Comprehensive metabolic panel     Status: Abnormal   Collection Time: 05/08/21 10:04 PM  Result Value Ref Range   Sodium 134 (L) 135 - 145 mmol/L   Potassium 3.7 3.5 - 5.1 mmol/L   Chloride 103 98 - 111 mmol/L   CO2 20 (L) 22 - 32 mmol/L   Glucose, Bld 132 (H) 70 - 99 mg/dL   BUN 9 6 -  20 mg/dL   Creatinine, Ser 37.9 0.44 - 1.00 mg/dL   Calcium 9.4 8.9 - 05/10/21 mg/dL   Total Protein 5.8 (L) 6.5 - 8.1 g/dL   Albumin 2.7 (L) 3.5 -  5.0 g/dL   AST 18 15 - 41 U/L   ALT 12 0 - 44 U/L   Alkaline Phosphatase 90 38 - 126 U/L   Total Bilirubin 0.2 (L) 0.3 - 1.2 mg/dL   GFR, Estimated >97 >67 mL/min   Anion gap 11 5 - 15    --/--/O POS (03/04 2104)  Imaging:  No results found.  MAU Course/MDM: I have ordered labs and reviewed results.  NST reviewed, reassuring Consult Dr Charlotta Newton with presentation, exam findings and test results. She recommends admission for observation.   Dr Normand Sloop consulted and agrees. Treatments in MAU included EFM.    Assessment: Single IUP at [redacted]w[redacted]d Gestational Hypertension, possible preeclampsia  Plan: Admit to [facility] MD to follow.  Wynelle Bourgeois CNM, MSN Certified Nurse-Midwife 05/08/2021 9:35 PM

## 2021-05-08 NOTE — MAU Note (Signed)
Pt reports she has been having increased swelling the past few days.  Took B/P yesterday and it was normal. To 150'/90's . C/O slight headache and some nausea and seeing some spots in her vision. Good fetal movement felt

## 2021-05-09 LAB — COMPREHENSIVE METABOLIC PANEL
ALT: 12 U/L (ref 0–44)
AST: 17 U/L (ref 15–41)
Albumin: 2.4 g/dL — ABNORMAL LOW (ref 3.5–5.0)
Alkaline Phosphatase: 74 U/L (ref 38–126)
Anion gap: 7 (ref 5–15)
BUN: 9 mg/dL (ref 6–20)
CO2: 23 mmol/L (ref 22–32)
Calcium: 8.6 mg/dL — ABNORMAL LOW (ref 8.9–10.3)
Chloride: 106 mmol/L (ref 98–111)
Creatinine, Ser: 0.67 mg/dL (ref 0.44–1.00)
GFR, Estimated: >60 mL/min (ref >60)
Glucose, Bld: 90 mg/dL (ref 70–99)
Potassium: 3.9 mmol/L (ref 3.5–5.1)
Sodium: 136 mmol/L (ref 135–145)
Total Bilirubin: 0.3 mg/dL (ref 0.3–1.2)
Total Protein: 5.5 g/dL — ABNORMAL LOW (ref 6.5–8.1)

## 2021-05-09 LAB — RESP PANEL BY RT-PCR (FLU A&B, COVID) ARPGX2
Influenza A by PCR: NEGATIVE
Influenza B by PCR: NEGATIVE
SARS Coronavirus 2 by RT PCR: NEGATIVE

## 2021-05-09 LAB — CBC
HCT: 29.7 % — ABNORMAL LOW (ref 36.0–46.0)
Hemoglobin: 9.6 g/dL — ABNORMAL LOW (ref 12.0–15.0)
MCH: 27.2 pg (ref 26.0–34.0)
MCHC: 32.3 g/dL (ref 30.0–36.0)
MCV: 84.1 fL (ref 80.0–100.0)
Platelets: 272 10*3/uL (ref 150–400)
RBC: 3.53 MIL/uL — ABNORMAL LOW (ref 3.87–5.11)
RDW: 15.3 % (ref 11.5–15.5)
WBC: 10.5 10*3/uL (ref 4.0–10.5)
nRBC: 0 % (ref 0.0–0.2)

## 2021-05-09 MED ORDER — POLYSACCHARIDE IRON COMPLEX 150 MG PO CAPS: 150.0000 mg | ORAL_CAPSULE | Freq: Every day | ORAL | 1 refills | Status: DC

## 2021-05-09 MED ORDER — MAGNESIUM OXIDE -MG SUPPLEMENT 400 (240 MG) MG PO TABS: 400.0000 mg | ORAL_TABLET | Freq: Every day | ORAL | 1 refills | Status: DC

## 2021-05-09 MED ORDER — VALACYCLOVIR HCL 500 MG PO TABS: 1000.0000 mg | ORAL_TABLET | Freq: Every day | ORAL | Status: DC

## 2021-05-09 MED ORDER — ONDANSETRON HCL 4 MG PO TABS
4.0000 mg | ORAL_TABLET | Freq: Three times a day (TID) | ORAL | Status: DC | PRN
  Administered 2021-05-09: 4 mg via ORAL
  Filled 2021-05-09: qty 1

## 2021-05-09 MED ORDER — BETAMETHASONE SOD PHOS & ACET 6 (3-3) MG/ML IJ SUSP
12.0000 mg | INTRAMUSCULAR | Status: DC
  Administered 2021-05-09: 12 mg via INTRAMUSCULAR
  Filled 2021-05-09: qty 5

## 2021-05-09 MED ORDER — BUTALBITAL-APAP-CAFFEINE 50-325-40 MG PO TABS: 2.0000 | ORAL_TABLET | Freq: Four times a day (QID) | ORAL | 0 refills | Status: DC | PRN

## 2021-05-09 NOTE — Progress Notes (Signed)
Pt discharged to home with boyfriend, Kandis Mannan.  Condition stable.  Discharge instructions reviewed with pt and boyfriend together.  Pt ambulated to car with Elson Areas, NT.  No equipment for home ordered at discharge.

## 2021-05-09 NOTE — Discharge Summary (Signed)
[facility] OB Discharge Summary  Patient Name: Peggy Mckenzie DOB: [birth year] MRN: [id number]  Date of admission: 05/08/2021 Intrauterine pregnancy: [redacted]w[redacted]d   Admitting diagnosis: Gestational hypertension [O13.9] Secondary diagnosis:   Date of discharge: 05/09/2021    Discharge diagnosis: Gestational Hypertension     Prenatal history: [id number]   EDC : 06/08/2021, by Last Menstrual Period  Prenatal care at [facility]  Primary provider : [facility] Prenatal course complicated by [facility]  Prenatal Labs: ABO, Rh: --/--/O POS (03/04 2104)  Antibody: NEG (03/04 2104) Rubella:    / RPR:    HBsAg:    HIV:    GBS:                                      Hospital course:  Blood pressure observation.  GHTN diagnosed. No meds required. IOL scheduled 05/17/21. Betamethasone given 05/09/21 @ 0925. Pt to return to MAU on 05/10/21 for 2nd dose.                                                     Labs: Lab Results  Component Value Date   WBC 10.5 05/09/2021   HGB 9.6 (L) 05/09/2021   HCT 29.7 (L) 05/09/2021   MCV 84.1 05/09/2021   PLT 272 05/09/2021   CMP Latest Ref Rng & Units 05/09/2021  Glucose 70 - 99 mg/dL 90  BUN 6 - 20 mg/dL 9  Creatinine 6.59 - 9.35 mg/dL 7.01  Sodium 779 - 390 mmol/L 136  Potassium 3.5 - 5.1 mmol/L 3.9  Chloride 98 - 111 mmol/L 106  CO2 22 - 32 mmol/L 23  Calcium 8.9 - 10.3 mg/dL 3.0(S)  Total Protein 6.5 - 8.1 g/dL 9.2(Z)  Total Bilirubin 0.3 - 1.2 mg/dL 0.3  Alkaline Phos 38 - 126 U/L 74  AST 15 - 41 U/L 17  ALT 0 - 44 U/L 12    Physical Exam @ time of discharge:  Vitals:   05/08/21 2350 05/09/21 0342 05/09/21 0723 05/09/21 1109  BP: 131/83 131/71 138/69 130/87  Pulse: 85 88 84 97  Resp: 20 18 18 18   Temp: 98.2 F (36.8 C) 98.3 F (36.8 C) 98.1 F (36.7 C) 98 F (36.7 C)  TempSrc: Oral Oral Oral Oral  SpO2: 100% 98% 98% 98%  Weight:      Height:       Physical Exam BP 130/87 (BP Location: Right Leg)   Pulse 97   Temp 98 F (36.7 C) (Oral)   Resp 18    Ht 5\' 3"  (1.6 m)   Wt 109.8 kg   LMP 09/01/2020   SpO2 98%   BMI 42.87 kg/m   Constitutional: Well-developed, well-nourished female in no acute distress.Denies HA, visual disturbances and epigastric pain. Cardiovascular: normal rate and rhythm Respiratory: normal effort, clear to auscultation bilaterally GI: Abd soft, non-tender, gravid appropriate for gestational age.   No rebound or guarding. MS: Extremities nontender, 2+ pedal/pretibial edema, normal ROM Neurologic: Alert and oriented x 4. DTRs 2+ with no clonus GU: Neg CVAT.   PELVIC EXAM:  Defered  Discharge Medications:  Allergies as of 05/09/2021   No Active Allergies      Medication List     STOP taking these medications  Hyoscyamine Sulfate SL 0.125 MG Subl Commonly known as: Levsin/SL   nitrofurantoin (macrocrystal-monohydrate) 100 MG capsule Commonly known as: MACROBID   ondansetron 4 MG tablet Commonly known as: ZOFRAN       TAKE these medications    acetaminophen 500 MG tablet Commonly known as: TYLENOL Take 1 tablet (500 mg total) by mouth every 6 (six) hours as needed.   butalbital-acetaminophen-caffeine 50-325-40 MG tablet Commonly known as: FIORICET Take 2 tablets by mouth every 6 (six) hours as needed for headache.   iron polysaccharides 150 MG capsule Commonly known as: NIFEREX Take 1 capsule (150 mg total) by mouth daily. Start taking on: May 10, 2021   magnesium oxide 400 (240 Mg) MG tablet Commonly known as: MAG-OX Take 1 tablet (400 mg total) by mouth daily. Start taking on: May 10, 2021   prenatal multivitamin Tabs tablet Take 1 tablet by mouth daily at 12 noon.   valACYclovir 1000 MG tablet Commonly known as: VALTREX Take 1,000 mg by mouth 2 (two) times daily. What changed: Another medication with the same name was removed. Continue taking this medication, and follow the directions you see here.       Diet: routine diet Activity: Advance as tolerated. Pelvic rest x 6  weeks.  Follow up:1 week Keep appointment with [facility] on Thursday. IOL scheduled for 05/17/21  Signed: Carollee Leitz MSN, CNM 05/09/2021, 11:39 AM

## 2021-05-09 NOTE — H&P (Addendum)
Peggy Mckenzie is a 30 y.o. female, G5P3013, IUP at 35.4 weeks, presenting for new onset GHTN, labs unremarkable, mild HA pain 4/10. Admit for 24 hours obs. PCR 0.18, hgb 9.8 with anemia, asymptomatic, other labs unremarkable. NST reactive.  Pt endorse + Fm. Denies vaginal leakage. Denies vaginal bleeding. Denies feeling cxt's.   MAU : HPI: Peggy Mckenzie is a 30 y.o. [phone] at 49w4dwho presents to maternity admissions reporting swelling, headache and visual changes (blurry at times, some spots in vision).  Has had some elevated BPs. . She reports good fetal movement, denies LOF, vaginal bleeding, vaginal itching/burning, urinary symptoms, h/a, dizziness, n/v, diarrhea, constipation or fever/chills.  She denies RUQ abdominal pain.   Hypertension This is a new problem. The current episode started today. The problem is unchanged. Associated symptoms include blurred vision, headaches and peripheral edema. Pertinent negatives include no chest pain, malaise/fatigue or shortness of breath. There are no associated agents to hypertension. There are no known risk factors for coronary artery disease. Past treatments include nothing. There are no compliance problems.     RN Note: Pt reports she has been having increased swelling the past few days.  Took B/P yesterday and it was normal. To 150'/90's . C/O slight headache and some nausea and seeing some spots in her vision. Good fetal movement felt Patient Active Problem List   Diagnosis Date Noted   Gestational hypertension 05/08/2021   Pregnant and not yet delivered in second trimester 01/12/2021   Pyelonephritis 01/10/2021     Active Ambulatory Problems    Diagnosis Date Noted   Pyelonephritis 01/10/2021   Pregnant and not yet delivered in second trimester 01/12/2021   Resolved Ambulatory Problems    Diagnosis Date Noted   Side pain 06/11/2013   Abdominal pain, unspecified site 06/29/2013   BV (bacterial vaginosis) 06/29/2013   Pregnancy  10/17/2014   Postpartum state 10/18/2014   Spontaneous vaginal delivery 08/21/2016   Past Medical History:  Diagnosis Date   Bipolar 1 disorder (HCC)    Depression    Headache    Infection    Pyelonephritis during pregnancy 2021      Medications Prior to Admission  Medication Sig Dispense Refill Last Dose   acetaminophen (TYLENOL) 500 MG tablet Take 1 tablet (500 mg total) by mouth every 6 (six) hours as needed. 30 tablet 0 05/07/2021   ondansetron (ZOFRAN) 4 MG tablet Take 1 tablet (4 mg total) by mouth every 8 (eight) hours as needed for nausea or vomiting. 20 tablet 0 Past Month   Prenatal Vit-Fe Fumarate-FA (PRENATAL MULTIVITAMIN) TABS tablet Take 1 tablet by mouth daily at 12 noon.   Past Week   valACYclovir (VALTREX) 1000 MG tablet TAKE 1 TABLET BY MOUTH ONCE A DAY 240 tablet 0 05/08/2021   Hyoscyamine Sulfate SL (LEVSIN/SL) 0.125 MG SUBL Place 1 tablet under the tongue every 8 (eight) hours as needed (abdominal cramping). 20 tablet 0    nitrofurantoin, macrocrystal-monohydrate, (MACROBID) 100 MG capsule Take 1 capsule (100 mg total) by mouth every 12 (twelve) hours. Take 1 pill every 12 hours for next 14 days then one every day until delivered for prophylatics. 60 capsule 2 More than a month   valACYclovir (VALTREX) 1000 MG tablet Take 1,000 mg by mouth 2 (two) times daily.       Past Medical History:  Diagnosis Date   Bipolar 1 disorder (HCC)    Depression    Headache    Infection    frequent UTI's  Pyelonephritis during pregnancy 2021     No current facility-administered medications on file prior to encounter.   Current Outpatient Medications on File Prior to Encounter  Medication Sig Dispense Refill   acetaminophen (TYLENOL) 500 MG tablet Take 1 tablet (500 mg total) by mouth every 6 (six) hours as needed. 30 tablet 0   ondansetron (ZOFRAN) 4 MG tablet Take 1 tablet (4 mg total) by mouth every 8 (eight) hours as needed for nausea or vomiting. 20 tablet 0   Prenatal  Vit-Fe Fumarate-FA (PRENATAL MULTIVITAMIN) TABS tablet Take 1 tablet by mouth daily at 12 noon.     valACYclovir (VALTREX) 1000 MG tablet TAKE 1 TABLET BY MOUTH ONCE A DAY 240 tablet 0   Hyoscyamine Sulfate SL (LEVSIN/SL) 0.125 MG SUBL Place 1 tablet under the tongue every 8 (eight) hours as needed (abdominal cramping). 20 tablet 0   nitrofurantoin, macrocrystal-monohydrate, (MACROBID) 100 MG capsule Take 1 capsule (100 mg total) by mouth every 12 (twelve) hours. Take 1 pill every 12 hours for next 14 days then one every day until delivered for prophylatics. 60 capsule 2   valACYclovir (VALTREX) 1000 MG tablet Take 1,000 mg by mouth 2 (two) times daily.       No Active Allergies   OB History     Gravida  5   Para  3   Term  3   Preterm      AB  1   Living  3      SAB  1   IAB      Ectopic      Multiple  0   Live Births  3          Past Medical History:  Diagnosis Date   Bipolar 1 disorder (HCC)    Depression    Headache    Infection    frequent UTI's   Pyelonephritis during pregnancy 2021   Past Surgical History:  Procedure Laterality Date   CYST REMOVAL HAND     FRACTURE SURGERY Left    leg   Family History: family history includes Birth defects in her paternal aunt; Hypertension in her paternal grandmother. Social History:  reports that she has never smoked. She has never used smokeless tobacco. She reports that she does not drink alcohol and does not use drugs.   ROS:  Review of Systems Constitutional:  Negative for malaise/fatigue. Eyes:  Positive for blurred vision. Respiratory:  Negative for shortness of breath.   Cardiovascular:  Negative for chest pain. Neurological:  Positive for headaches.  Other systems negative  Physical Exam: BP 131/83 (BP Location: Left Arm)   Pulse 85   Temp 98.2 F (36.8 C) (Oral)   Resp 20   Ht 5\' 3"  (1.6 m)   Wt 109.8 kg   LMP 09/01/2020   SpO2 100%   BMI 42.87 kg/m   Constitutional: Well-developed,  well-nourished female in no acute distress. Cardiovascular: normal rate and rhythm Respiratory: normal effort, clear to auscultation bilaterally GI: Abd soft, non-tender, gravid appropriate for gestational age.   No rebound or guarding. MS: Extremities nontender, 2+ pedal/pretibial edema, normal ROM Neurologic: Alert and oriented x 4. DTRs 2+ with no clonus GU: Neg CVAT.   PELVIC EXAM:  Defered   FHT:  Baseline 140 , moderate variability, accelerations present, no decelerations Contractions: Irregular  Labs: Results for orders placed or performed during the hospital encounter of 05/08/21 (from the past 24 hour(s))  Protein / creatinine ratio, urine  Status: Abnormal   Collection Time: 05/08/21  9:35 PM  Result Value Ref Range   Creatinine, Urine 56.13 mg/dL   Total Protein, Urine 10 mg/dL   Protein Creatinine Ratio 0.18 (H) 0.00 - 0.15 mg/mg[Cre]  CBC     Status: Abnormal   Collection Time: 05/08/21 10:04 PM  Result Value Ref Range   WBC 10.9 (H) 4.0 - 10.5 K/uL   RBC 3.64 (L) 3.87 - 5.11 MIL/uL   Hemoglobin 9.8 (L) 12.0 - 15.0 g/dL   HCT 12.4 (L) 58.0 - 99.8 %   MCV 83.8 80.0 - 100.0 fL   MCH 26.9 26.0 - 34.0 pg   MCHC 32.1 30.0 - 36.0 g/dL   RDW 33.8 25.0 - 53.9 %   Platelets 310 150 - 400 K/uL   nRBC 0.0 0.0 - 0.2 %  Comprehensive metabolic panel     Status: Abnormal   Collection Time: 05/08/21 10:04 PM  Result Value Ref Range   Sodium 134 (L) 135 - 145 mmol/L   Potassium 3.7 3.5 - 5.1 mmol/L   Chloride 103 98 - 111 mmol/L   CO2 20 (L) 22 - 32 mmol/L   Glucose, Bld 132 (H) 70 - 99 mg/dL   BUN 9 6 - 20 mg/dL   Creatinine, Ser 7.67 0.44 - 1.00 mg/dL   Calcium 9.4 8.9 - 34.1 mg/dL   Total Protein 5.8 (L) 6.5 - 8.1 g/dL   Albumin 2.7 (L) 3.5 - 5.0 g/dL   AST 18 15 - 41 U/L   ALT 12 0 - 44 U/L   Alkaline Phosphatase 90 38 - 126 U/L   Total Bilirubin 0.2 (L) 0.3 - 1.2 mg/dL   GFR, Estimated >93 >79 mL/min   Anion gap 11 5 - 15    Imaging:  No results  found.  MAU Course: Orders Placed This Encounter  Procedures   Resp Panel by RT-PCR (Flu A&B, Covid) Nasopharyngeal Swab   CBC   Comprehensive metabolic panel   Protein / creatinine ratio, urine   Comprehensive metabolic panel   CBC   Diet regular Room service appropriate? Yes; Fluid consistency: Thin   Notify physician (specify)   Vital signs   Defer vaginal exam for vaginal bleeding or PROM <37 weeks   Initiate Oral Care Protocol   Initiate Carrier Fluid Protocol   Fetal monitoring every shift x 30 minutes   Activity as tolerated   Full code   Airborne and Contact precautions   Place in observation (patient's expected length of stay will be less than 2 midnights)   Meds ordered this encounter  Medications   zolpidem (AMBIEN) tablet 5 mg   docusate sodium (COLACE) capsule 100 mg   calcium carbonate (TUMS - dosed in mg elemental calcium) chewable tablet 400 mg of elemental calcium   prenatal multivitamin tablet 1 tablet   butalbital-acetaminophen-caffeine (FIORICET) 50-325-40 MG per tablet 2 tablet   iron polysaccharides (NIFEREX) capsule 150 mg   magnesium oxide (MAG-OX) tablet 400 mg   ondansetron (ZOFRAN) tablet 4 mg    Assessment/Plan: Peggy Mckenzie is a 30 y.o. female, G5P3013, IUP at 35.4 weeks, presenting for new onset GHTN, labs unremarkable, mild HA pain 4/10. Admit for 24 hours obs. PCR 0.18, hgb 9.8 with anemia, asymptomatic, other labs unremarkable. NST reactive.  Pt endorse + Fm. Denies vaginal leakage. Denies vaginal bleeding. Denies feeling cxt's.   MAU Course/MDM: I have ordered labs and reviewed results. NST reviewed, reassuring Consult Dr Charlotta Newton with presentation, exam findings  and test results. She recommends admission for observation.   Dr Normand Sloopillard consulted and agrees. Treatments in MAU included EFM.    Plan: Admit to Ante for 24 hours obs Regular Diet Fioricet for HA Iron for anemia with magnesium PO  Repeat cmp, cbc in morning Monitor BP.   Plan IOL for 37 weeks for GHTN.   Dale DurhamJade Jenah Vanasten NP-C, CNM, MSN 05/09/2021, 12:57 AM

## 2021-05-10 ENCOUNTER — Other Ambulatory Visit: Payer: Self-pay

## 2021-05-10 ENCOUNTER — Inpatient Hospital Stay (EMERGENCY_DEPARTMENT_HOSPITAL)
Admission: AD | Admit: 2021-05-10 | Discharge: 2021-05-10 | Disposition: A | Discharge status: Home / Self Care | Payer: 59 | Source: Home / Self Care | Attending: Obstetrics and Gynecology | Admitting: Obstetrics and Gynecology

## 2021-05-10 DIAGNOSIS — Z3689 Encounter for other specified antenatal screening: Secondary | ICD-10-CM

## 2021-05-10 DIAGNOSIS — Z09 Encounter for follow-up examination after completed treatment for conditions other than malignant neoplasm: Secondary | ICD-10-CM

## 2021-05-10 DIAGNOSIS — O133 Gestational [pregnancy-induced] hypertension without significant proteinuria, third trimester: Secondary | ICD-10-CM | POA: Insufficient documentation

## 2021-05-10 DIAGNOSIS — Z3A35 35 weeks gestation of pregnancy: Secondary | ICD-10-CM

## 2021-05-10 MED ORDER — BETAMETHASONE SOD PHOS & ACET 6 (3-3) MG/ML IJ SUSP
12.0000 mg | Freq: Once | INTRAMUSCULAR | Status: AC
  Administered 2021-05-10: 12 mg via INTRAMUSCULAR

## 2021-05-10 NOTE — MAU Provider Note (Signed)
Event Date/Time   First Provider Initiated Contact with Patient 05/10/21 1015      S [name] is a 30 y.o. [phone] patient who presents to MAU today for her second dose of betamethasone. She denies any vaginal bleeding, leaking or discharge. Denies any pain. Reports normal fetal movement. Denies any HA, visual changes or epigastric pain.  O LMP 09/01/2020  Physical Exam Vitals and nursing note reviewed.  Constitutional:      General: She is not in acute distress.    Appearance: She is well-developed.  HENT:     Head: Normocephalic.  Eyes:     Pupils: Pupils are equal, round, and reactive to light.  Cardiovascular:     Rate and Rhythm: Normal rate and regular rhythm.     Heart sounds: Normal heart sounds.  Pulmonary:     Effort: Pulmonary effort is normal. No respiratory distress.     Breath sounds: Normal breath sounds.  Abdominal:     General: Bowel sounds are normal. There is no distension.     Palpations: Abdomen is soft.     Tenderness: There is no abdominal tenderness.  Skin:    General: Skin is warm and dry.  Neurological:     Mental Status: She is alert and oriented to person, place, and time.  Psychiatric:        Mood and Affect: Mood normal.        Behavior: Behavior normal.        Thought Content: Thought content normal.        Judgment: Judgment normal.    A Medical screening exam complete 1. Follow up   2. [redacted] weeks gestation of pregnancy    BMZ given and tolerated well  P Discharge from MAU in stable condition Patient given the option of transfer to [facility] for further evaluation or seek care in outpatient facility of choice  List of options for follow-up given Warning signs for worsening condition that would warrant emergency follow-up discussed Patient may return to MAU as needed   Rolm Bookbinder, CNM 05/10/2021 10:15 AM

## 2021-05-10 NOTE — MAU Note (Signed)
Here for 2nd dose of BMZ.  No problems with injection yesterday.  Feeling ok.  Had slight HA and "spots" once during the night- none since, HA gone . Denies epigastric pain. Occ random ctx. No bleeding or leaking, +FM.  Next appt is Thur in office

## 2021-05-11 ENCOUNTER — Inpatient Hospital Stay (HOSPITAL_COMMUNITY): Payer: 59 | Admitting: Anesthesiology

## 2021-05-11 ENCOUNTER — Other Ambulatory Visit: Payer: Self-pay

## 2021-05-11 ENCOUNTER — Encounter (HOSPITAL_COMMUNITY): Payer: Self-pay | Admitting: Obstetrics & Gynecology

## 2021-05-11 ENCOUNTER — Inpatient Hospital Stay (HOSPITAL_COMMUNITY)
Admission: AD | Admit: 2021-05-11 | Discharge: 2021-05-15 | DRG: 806 | Disposition: A | Discharge status: Home / Self Care | Payer: 59 | Attending: Obstetrics and Gynecology | Admitting: Obstetrics and Gynecology

## 2021-05-11 DIAGNOSIS — O1493 Unspecified pre-eclampsia, third trimester: Secondary | ICD-10-CM

## 2021-05-11 DIAGNOSIS — O9902 Anemia complicating childbirth: Secondary | ICD-10-CM | POA: Diagnosis present

## 2021-05-11 DIAGNOSIS — O9832 Other infections with a predominantly sexual mode of transmission complicating childbirth: Secondary | ICD-10-CM | POA: Diagnosis present

## 2021-05-11 DIAGNOSIS — Z3A36 36 weeks gestation of pregnancy: Secondary | ICD-10-CM | POA: Diagnosis not present

## 2021-05-11 DIAGNOSIS — O1410 Severe pre-eclampsia, unspecified trimester: Secondary | ICD-10-CM | POA: Diagnosis not present

## 2021-05-11 DIAGNOSIS — R519 Headache, unspecified: Secondary | ICD-10-CM | POA: Diagnosis present

## 2021-05-11 DIAGNOSIS — A6000 Herpesviral infection of urogenital system, unspecified: Secondary | ICD-10-CM | POA: Diagnosis present

## 2021-05-11 DIAGNOSIS — Z3492 Encounter for supervision of normal pregnancy, unspecified, second trimester: Secondary | ICD-10-CM | POA: Diagnosis not present

## 2021-05-11 DIAGNOSIS — O43123 Velamentous insertion of umbilical cord, third trimester: Secondary | ICD-10-CM | POA: Diagnosis present

## 2021-05-11 DIAGNOSIS — O1414 Severe pre-eclampsia complicating childbirth: Secondary | ICD-10-CM | POA: Diagnosis present

## 2021-05-11 DIAGNOSIS — O1413 Severe pre-eclampsia, third trimester: Secondary | ICD-10-CM | POA: Diagnosis not present

## 2021-05-11 DIAGNOSIS — B009 Herpesviral infection, unspecified: Secondary | ICD-10-CM | POA: Diagnosis not present

## 2021-05-11 DIAGNOSIS — O99824 Streptococcus B carrier state complicating childbirth: Secondary | ICD-10-CM | POA: Diagnosis present

## 2021-05-11 DIAGNOSIS — O09899 Supervision of other high risk pregnancies, unspecified trimester: Secondary | ICD-10-CM | POA: Diagnosis not present

## 2021-05-11 DIAGNOSIS — Z23 Encounter for immunization: Secondary | ICD-10-CM

## 2021-05-11 DIAGNOSIS — F319 Bipolar disorder, unspecified: Secondary | ICD-10-CM | POA: Diagnosis not present

## 2021-05-11 HISTORY — DX: Hemorrhage, not elsewhere classified: R58

## 2021-05-11 LAB — CBC
HCT: 31.6 % — ABNORMAL LOW (ref 36.0–46.0)
Hemoglobin: 9.9 g/dL — ABNORMAL LOW (ref 12.0–15.0)
MCH: 26.7 pg (ref 26.0–34.0)
MCHC: 31.3 g/dL (ref 30.0–36.0)
MCV: 85.2 fL (ref 80.0–100.0)
Platelets: 328 10*3/uL (ref 150–400)
RBC: 3.71 MIL/uL — ABNORMAL LOW (ref 3.87–5.11)
RDW: 15.8 % — ABNORMAL HIGH (ref 11.5–15.5)
WBC: 12.6 10*3/uL — ABNORMAL HIGH (ref 4.0–10.5)
nRBC: 0 % (ref 0.0–0.2)

## 2021-05-11 LAB — URINALYSIS, ROUTINE W REFLEX MICROSCOPIC
Bilirubin Urine: NEGATIVE
Glucose, UA: NEGATIVE mg/dL
Ketones, ur: NEGATIVE mg/dL
Nitrite: NEGATIVE
Protein, ur: NEGATIVE mg/dL
Specific Gravity, Urine: 1.008 (ref 1.005–1.030)
pH: 7 (ref 5.0–8.0)

## 2021-05-11 LAB — CBC WITH DIFFERENTIAL/PLATELET
Abs Immature Granulocytes: 0.45 10*3/uL — ABNORMAL HIGH (ref 0.00–0.07)
Basophils Absolute: 0 10*3/uL (ref 0.0–0.1)
Basophils Relative: 0 %
Eosinophils Absolute: 0 10*3/uL (ref 0.0–0.5)
Eosinophils Relative: 0 %
HCT: 29.9 % — ABNORMAL LOW (ref 36.0–46.0)
Hemoglobin: 9.6 g/dL — ABNORMAL LOW (ref 12.0–15.0)
Immature Granulocytes: 3 %
Lymphocytes Relative: 12 %
Lymphs Abs: 1.8 10*3/uL (ref 0.7–4.0)
MCH: 27 pg (ref 26.0–34.0)
MCHC: 32.1 g/dL (ref 30.0–36.0)
MCV: 84 fL (ref 80.0–100.0)
Monocytes Absolute: 1.1 10*3/uL — ABNORMAL HIGH (ref 0.1–1.0)
Monocytes Relative: 7 %
Neutro Abs: 11.8 10*3/uL — ABNORMAL HIGH (ref 1.7–7.7)
Neutrophils Relative %: 78 %
Platelets: 309 10*3/uL (ref 150–400)
RBC: 3.56 MIL/uL — ABNORMAL LOW (ref 3.87–5.11)
RDW: 15.6 % — ABNORMAL HIGH (ref 11.5–15.5)
WBC: 15.2 10*3/uL — ABNORMAL HIGH (ref 4.0–10.5)
nRBC: 0 % (ref 0.0–0.2)

## 2021-05-11 LAB — COMPREHENSIVE METABOLIC PANEL
ALT: 14 U/L (ref 0–44)
AST: 22 U/L (ref 15–41)
Albumin: 2.7 g/dL — ABNORMAL LOW (ref 3.5–5.0)
Alkaline Phosphatase: 84 U/L (ref 38–126)
Anion gap: 9 (ref 5–15)
BUN: 10 mg/dL (ref 6–20)
CO2: 21 mmol/L — ABNORMAL LOW (ref 22–32)
Calcium: 9.4 mg/dL (ref 8.9–10.3)
Chloride: 106 mmol/L (ref 98–111)
Creatinine, Ser: 0.68 mg/dL (ref 0.44–1.00)
GFR, Estimated: >60 mL/min (ref >60)
Glucose, Bld: 103 mg/dL — ABNORMAL HIGH (ref 70–99)
Potassium: 3.9 mmol/L (ref 3.5–5.1)
Sodium: 136 mmol/L (ref 135–145)
Total Bilirubin: 0.3 mg/dL (ref 0.3–1.2)
Total Protein: 5.9 g/dL — ABNORMAL LOW (ref 6.5–8.1)

## 2021-05-11 LAB — PROTEIN / CREATININE RATIO, URINE
Creatinine, Urine: 29.07 mg/dL
Protein Creatinine Ratio: 0.69 mg/mg{Cre} — ABNORMAL HIGH (ref 0.00–0.15)
Total Protein, Urine: 20 mg/dL

## 2021-05-11 LAB — GROUP B STREP BY PCR: Group B strep by PCR: POSITIVE — AB

## 2021-05-11 MED ORDER — LABETALOL HCL 5 MG/ML IV SOLN: 80.0000 mg | INTRAVENOUS | Status: DC | PRN

## 2021-05-11 MED ORDER — TERBUTALINE SULFATE 1 MG/ML IJ SOLN: 0.2500 mg | Freq: Once | INTRAMUSCULAR | Status: DC | PRN

## 2021-05-11 MED ORDER — SOD CITRATE-CITRIC ACID 500-334 MG/5ML PO SOLN
30.0000 mL | ORAL | Status: DC | PRN
  Administered 2021-05-11: 30 mL via ORAL
  Filled 2021-05-11: qty 30

## 2021-05-11 MED ORDER — PHENYLEPHRINE 40 MCG/ML (10ML) SYRINGE FOR IV PUSH (FOR BLOOD PRESSURE SUPPORT)
80.0000 ug | PREFILLED_SYRINGE | INTRAVENOUS | Status: DC | PRN
  Filled 2021-05-11: qty 10

## 2021-05-11 MED ORDER — EPHEDRINE 5 MG/ML INJ: 10.0000 mg | INTRAVENOUS | Status: DC | PRN

## 2021-05-11 MED ORDER — OXYTOCIN BOLUS FROM INFUSION: 333.0000 mL | Freq: Once | INTRAVENOUS | Status: DC

## 2021-05-11 MED ORDER — FLEET ENEMA 7-19 GM/118ML RE ENEM: 1.0000 | ENEMA | RECTAL | Status: DC | PRN

## 2021-05-11 MED ORDER — LABETALOL HCL 5 MG/ML IV SOLN: 40.0000 mg | INTRAVENOUS | Status: DC | PRN

## 2021-05-11 MED ORDER — FENTANYL CITRATE (PF) 100 MCG/2ML IJ SOLN
50.0000 ug | INTRAMUSCULAR | Status: DC | PRN
  Administered 2021-05-11: 100 ug via INTRAVENOUS
  Filled 2021-05-11: qty 2

## 2021-05-11 MED ORDER — PHENYLEPHRINE 40 MCG/ML (10ML) SYRINGE FOR IV PUSH (FOR BLOOD PRESSURE SUPPORT): 80.0000 ug | PREFILLED_SYRINGE | INTRAVENOUS | Status: DC | PRN

## 2021-05-11 MED ORDER — LABETALOL HCL 5 MG/ML IV SOLN: 20.0000 mg | INTRAVENOUS | Status: DC | PRN

## 2021-05-11 MED ORDER — OXYTOCIN-SODIUM CHLORIDE 30-0.9 UT/500ML-% IV SOLN
2.5000 [IU]/h | INTRAVENOUS | Status: DC
  Filled 2021-05-11: qty 500

## 2021-05-11 MED ORDER — MAGNESIUM SULFATE BOLUS VIA INFUSION
4.0000 g | Freq: Once | INTRAVENOUS | Status: AC
  Administered 2021-05-11: 4 g via INTRAVENOUS
  Filled 2021-05-11: qty 1000

## 2021-05-11 MED ORDER — ONDANSETRON HCL 4 MG/2ML IJ SOLN
4.0000 mg | Freq: Four times a day (QID) | INTRAMUSCULAR | Status: DC | PRN
  Administered 2021-05-11: 4 mg via INTRAVENOUS
  Filled 2021-05-11: qty 2

## 2021-05-11 MED ORDER — BUTALBITAL-APAP-CAFFEINE 50-325-40 MG PO TABS
1.0000 | ORAL_TABLET | Freq: Once | ORAL | Status: AC
  Administered 2021-05-11: 1 via ORAL
  Filled 2021-05-11: qty 1

## 2021-05-11 MED ORDER — FENTANYL-BUPIVACAINE-NACL 0.5-0.125-0.9 MG/250ML-% EP SOLN
12.0000 mL/h | EPIDURAL | Status: DC | PRN
  Administered 2021-05-11: 12 mL/h via EPIDURAL
  Filled 2021-05-11: qty 250

## 2021-05-11 MED ORDER — HYDRALAZINE HCL 20 MG/ML IJ SOLN: 10.0000 mg | INTRAMUSCULAR | Status: DC | PRN

## 2021-05-11 MED ORDER — LACTATED RINGERS IV SOLN: INTRAVENOUS | Status: DC

## 2021-05-11 MED ORDER — LIDOCAINE HCL (PF) 1 % IJ SOLN
INTRAMUSCULAR | Status: DC | PRN
  Administered 2021-05-11: 11 mL via EPIDURAL

## 2021-05-11 MED ORDER — OXYTOCIN-SODIUM CHLORIDE 30-0.9 UT/500ML-% IV SOLN
1.0000 m[IU]/min | INTRAVENOUS | Status: DC
  Administered 2021-05-11: 2 m[IU]/min via INTRAVENOUS
  Filled 2021-05-11: qty 500

## 2021-05-11 MED ORDER — LACTATED RINGERS IV SOLN
500.0000 mL | Freq: Once | INTRAVENOUS | Status: AC
  Administered 2021-05-11: 250 mL via INTRAVENOUS

## 2021-05-11 MED ORDER — LIDOCAINE HCL (PF) 1 % IJ SOLN: 30.0000 mL | INTRAMUSCULAR | Status: DC | PRN

## 2021-05-11 MED ORDER — DIPHENHYDRAMINE HCL 50 MG/ML IJ SOLN
12.5000 mg | INTRAMUSCULAR | Status: AC | PRN
  Administered 2021-05-11 – 2021-05-12 (×3): 12.5 mg via INTRAVENOUS
  Filled 2021-05-11 (×2): qty 1

## 2021-05-11 MED ORDER — PENICILLIN G POT IN DEXTROSE 60000 UNIT/ML IV SOLN
3.0000 10*6.[IU] | INTRAVENOUS | Status: DC
  Administered 2021-05-11 – 2021-05-12 (×5): 3 10*6.[IU] via INTRAVENOUS
  Filled 2021-05-11 (×5): qty 50

## 2021-05-11 MED ORDER — MISOPROSTOL 25 MCG QUARTER TABLET
25.0000 ug | ORAL_TABLET | ORAL | Status: DC | PRN
  Administered 2021-05-11: 25 ug via VAGINAL
  Filled 2021-05-11: qty 1

## 2021-05-11 MED ORDER — MAGNESIUM SULFATE 40 GM/1000ML IV SOLN
2.0000 g/h | INTRAVENOUS | Status: DC
  Administered 2021-05-12: 2 g/h via INTRAVENOUS
  Filled 2021-05-11 (×2): qty 1000

## 2021-05-11 MED ORDER — LACTATED RINGERS IV SOLN: 500.0000 mL | INTRAVENOUS | Status: DC | PRN

## 2021-05-11 MED ORDER — SODIUM CHLORIDE 0.9 % IV SOLN
5.0000 10*6.[IU] | Freq: Once | INTRAVENOUS | Status: AC
  Administered 2021-05-11: 5 10*6.[IU] via INTRAVENOUS
  Filled 2021-05-11: qty 5

## 2021-05-11 MED ORDER — ACETAMINOPHEN 325 MG PO TABS
650.0000 mg | ORAL_TABLET | ORAL | Status: DC | PRN
  Administered 2021-05-11: 650 mg via ORAL
  Filled 2021-05-11: qty 2

## 2021-05-11 NOTE — Anesthesia Procedure Notes (Signed)
Epidural Patient location during procedure: OB Start time: 05/11/2021 8:12 PM End time: 05/11/2021 8:25 PM  Staffing Anesthesiologist: Lowella Curb, MD Performed: anesthesiologist   Preanesthetic Checklist Completed: patient identified, IV checked, site marked, risks and benefits discussed, surgical consent, monitors and equipment checked, pre-op evaluation and timeout performed  Epidural Patient position: sitting Prep: ChloraPrep Patient monitoring: heart rate, cardiac monitor, continuous pulse ox and blood pressure Approach: midline Location: L2-L3 Injection technique: LOR saline  Needle:  Needle type: Tuohy  Needle gauge: 17 G Needle length: 9 cm Needle insertion depth: 7 cm Catheter type: closed end flexible Catheter size: 20 Guage Catheter at skin depth: 12 cm Test dose: negative  Assessment Events: blood not aspirated, injection not painful, no injection resistance, no paresthesia and negative IV test  Additional Notes Reason for block:procedure for pain

## 2021-05-11 NOTE — H&P (Addendum)
OB ADMISSION/ HISTORY & PHYSICAL:  Admission Date: 05/11/2021  5:01 AM  Admit Diagnosis: Severe pre-eclampsia  Peggy Mckenzie is a 30 y.o. female [phone] [redacted]w[redacted]d presenting for HA x24 hrs and seeing spots. HA refractory to Tylenol. Admitted for new onset GHTN on 7/1 w/ urine protein creatine ratio of 0.18, rec'vd antenatal steroids, and dc'd home on Fioricet, Magnesium PO, and iron for anemia. Endorses active FM, denies LOF and vaginal bleeding. Hospitalized for pyelo in 2nd trimester, no urinary complaints today. Hx of HSV-initial outbreak in 09/2020, last outbreak 04/28/2021. Pt reports taking Valtrex 1000 mg PO daily since Nov 2021. Denies prodromal S/S today. Hx of bipolar depression-takes no meds, and is not in therapy, denies PP depression after prev births, reports stable mood x2 yrs.   History of current pregnancy: [id number]   Patient entered care with [facility] at 11+4 wks.   EDC 06/08/21 by 5+5 wk US Anatomy scan:  19 wks, complete w/ posterior placenta.    Significant prenatal events:  Patient Active Problem List   Diagnosis Date Noted   Bipolar depression (HCC) 05/11/2021   HSV infection 05/11/2021   Preeclampsia, severe, third trimester 05/11/2021   Gestational hypertension 05/08/2021    Prenatal Labs: ABO, Rh: --/--/O POS (03/04 2104) Antibody: NEG (03/04 2104) Rubella:   immune RPR:   NR HBsAg:   NR HIV:   NR GTT: passed 1 hr GBS:   not done, will collect on admission GC/CHL: neg Genetics: low-risk female Tdap/influenza vaccines: declined   OB History  Gravida Para Term Preterm AB Living  5 3 3   1 3   SAB IAB Ectopic Multiple Live Births  1     0 3    # Outcome Date GA Lbr Len/2nd Weight Sex Delivery Anes PTL Lv  5 Current           4 Term 08/21/16 [redacted]w[redacted]d 01:48 / 00:14 3435 g F Vag-Spont EPI  LIV  3 Term 10/18/14 [redacted]w[redacted]d 03:58 / 00:30 3570 g M Vag-Spont EPI  LIV     Birth Comments: None  2 SAB 01/2014          1 Term 12/14/09    M Vag-Spont EPI  LIV    Medical /  Surgical History: Past medical history:  Past Medical History:  Diagnosis Date   Bipolar 1 disorder (HCC)    Depression    Headache    Infection    frequent UTI's   Pyelonephritis during pregnancy 2021    Past surgical history:  Past Surgical History:  Procedure Laterality Date   CYST REMOVAL HAND     FRACTURE SURGERY Left    leg   Family History:  Family History  Problem Relation Age of Onset   Birth defects Paternal Aunt        spina bifida   Hypertension Paternal Grandmother     Social History:  reports that she has never smoked. She has never used smokeless tobacco. She reports that she does not drink alcohol and does not use drugs.  Allergies: Patient has no active allergies.   Current Medications at time of admission:  Prior to Admission medications   Medication Sig Start Date End Date Taking? Authorizing Provider  acetaminophen (TYLENOL) 500 MG tablet Take 1 tablet (500 mg total) by mouth every 6 (six) hours as needed. 04/21/20  Yes Fawze, Mina A, PA-C  butalbital-acetaminophen-caffeine (FIORICET) 50-325-40 MG tablet Take 2 tablets by mouth every 6 (six) hours as needed for headache. 05/09/21  Yes Holshouser, Gerhard Munch, CNM  iron polysaccharides (NIFEREX) 150 MG capsule Take 1 capsule (150 mg total) by mouth daily. 05/10/21  Yes Holshouser, Gerhard Munch, CNM  magnesium oxide (MAG-OX) 400 (240 Mg) MG tablet Take 1 tablet (400 mg total) by mouth daily. 05/10/21  Yes Holshouser, Gerhard Munch, CNM  Prenatal Vit-Fe Fumarate-FA (PRENATAL MULTIVITAMIN) TABS tablet Take 1 tablet by mouth daily at 12 noon.   Yes [provider]  valACYclovir (VALTREX) 1000 MG tablet Take 1,000 mg by mouth 2 (two) times daily.   Yes [provider]    Review of Systems: Constitutional: Negative   HENT: Negative   Eyes: Negative   Respiratory: Negative   Cardiovascular: Negative   Gastrointestinal: Negative  Genitourinary: neg for bloody show, neg for LOF   Musculoskeletal:  Negative   Skin: Negative   Neurological: Negative   Endo/Heme/Allergies: Negative   Psychiatric/Behavioral: Negative    Physical Exam: VS: Blood pressure (!) 150/87, pulse 80, temperature 98.5 F (36.9 C), resp. rate 18, height 5\' 3"  (1.6 m), weight 108.4 kg, last menstrual period 09/01/2020, SpO2 98 %, unknown if currently breastfeeding. AAO x3, no signs of distress Cardiovascular: RRR Respiratory: Lung fields clear to ausculation GU/GI: Abdomen gravid, non-tender, non-distended, active FM, vertex, EFW 6.5# per Leopold's Extremities: 2+ edema, negative for pain, tenderness, and cords Neuro: +2 DTR, neg clonus  Cervical exam:Dilation: 1 Station: -3 Exam by:: [email or web address], CNM FHR: baseline rate 130 / variability moderate / accelerations present / absent decelerations TOCO: irreg   Prenatal Transfer Tool  Maternal Diabetes: No Genetic Screening: Normal Maternal Ultrasounds/Referrals: Normal Fetal Ultrasounds or other Referrals:  None Maternal Substance Abuse:  No Significant Maternal Medications:  Meds include: Other: Valtrex Significant Maternal Lab Results: Other:  GBS pending    Assessment: 30 y.o. 34 [redacted]w[redacted]d Pre-eclampsia w/ severe features  Admit for IOL FHR category 1 Betamethasone complete 05/11/21 @ 1300 GBS collect PCR Pain management plan: epidural Hx bipolar depression     -takes no meds    -stable mood    -re-eval after delivery, discuss treatment options, F/U in 1 week PP for mood eval Hx HSV    -last outbreak 04/28/21    -negative SSE, denies prodromal symptoms    -Valtrex 1000 mg PO daily since 09/2020  Plan:  Admit to L&D Magnesium for seizure prophylaxis 4G load then 2G/hr Routine admission orders Cytotec Epidural PRN  Dr 10/2020 aware of admission and plan of care  Normand Sloop MSN, CNM 05/11/2021 7:59 AM

## 2021-05-11 NOTE — Anesthesia Preprocedure Evaluation (Signed)
Anesthesia Evaluation  Patient identified by MRN, date of birth, ID band Patient awake    Reviewed: Allergy & Precautions, H&P , NPO status , Patient's Chart, lab work & pertinent test results  Airway Mallampati: II  TM Distance: >3 FB Neck ROM: full    Dental no notable dental hx.    Pulmonary neg pulmonary ROS,    Pulmonary exam normal        Cardiovascular negative cardio ROS Normal cardiovascular exam     Neuro/Psych    GI/Hepatic negative GI ROS, Neg liver ROS,   Endo/Other  Morbid obesity  Renal/GU negative Renal ROS     Musculoskeletal   Abdominal (+) + obese,   Peds  Hematology negative hematology ROS (+)   Anesthesia Other Findings   Reproductive/Obstetrics (+) Pregnancy                             Anesthesia Physical  Anesthesia Plan  ASA: III  Anesthesia Plan: Epidural   Post-op Pain Management:    Induction:   PONV Risk Score and Plan:   Airway Management Planned:   Additional Equipment:   Intra-op Plan:   Post-operative Plan:   Informed Consent: I have reviewed the patients History and Physical, chart, labs and discussed the procedure including the risks, benefits and alternatives for the proposed anesthesia with the patient or authorized representative who has indicated his/her understanding and acceptance.       Plan Discussed with:   Anesthesia Plan Comments:         Anesthesia Quick Evaluation

## 2021-05-11 NOTE — Progress Notes (Signed)
Subjective:    Discussed effective coping w/ ctx and encouraged breathing. Pt planning an epidural. Discussed amniotomy w/ fetal decent and pt agrees.   Objective:    VS: BP 113/61   Pulse 72   Temp 98 F (36.7 C) (Oral)   Resp 20   Ht 5\' 3"  (1.6 m)   Wt 108.4 kg   LMP 09/01/2020   SpO2 98%   BMI 42.34 kg/m  FHR : baseline 130 / variability moderate / accelerations present / absent decelerations Toco: contractions every 3-5 minutes  Membranes: intact Dilation: 4 Effacement (%): 50 Cervical Position: Middle Station: -3 Presentation: Vertex Exam by: 09/03/2020, CNM Pitocin 4 mU/min  Assessment/Plan:   30 y.o. 34 [redacted]w[redacted]d IOL for pre-e w/ severe features  Labor: Progressing on Pitocin, will continue to increase then AROM Preeclampsia:  on magnesium sulfate, intake and ouput balanced, and labs stable Fetal Wellbeing:  Category I Pain Control:   planning epidural I/D:   GBS pos, PCN x 2 doses Anticipated MOD:  NSVD  [redacted]w[redacted]d MSN, CNM 05/11/2021 6:19 PM

## 2021-05-11 NOTE — MAU Note (Signed)
Headache since 12n Sat. B/P has been up around 150s/90s to 100s. Starting to see spots for last couple hrs. Nausea. Took Tylenol at 64mn  and several times yesterday but did not help so I stopped taking it. This is a different type H/a -"it sucks". Just do not feel well

## 2021-05-11 NOTE — MAU Provider Note (Signed)
History     CSN: [id number]  Arrival date and time: 05/11/21 0501   Event Date/Time   First Provider Initiated Contact with Patient 05/11/21 0555      Chief Complaint  Patient presents with   Hypertension   Eye Problem   HPI [name] is a 30 y.o. [phone] at [redacted]w[redacted]d who presents to MAU today with complaint of elevated blood pressure and headache. Patient was admitted for observation on 05/09/21. She was diagnosed with GHTN and sent home to monitor. She states headache has been worse since yesterday, now rated at 6/10 and unresponsive to Tylenol. Last Tylenol at midnight. She states pain woke her from sleep and she endorses associated scotoma. She has LE edema same as previously and denies RUQ pain. She reports normal fetal movement and denies vaginal bleeding, LOF or contractions.    OB History     Gravida  5   Para  3   Term  3   Preterm      AB  1   Living  3      SAB  1   IAB      Ectopic      Multiple  0   Live Births  3           Past Medical History:  Diagnosis Date   Bipolar 1 disorder (HCC)    Depression    Headache    Infection    frequent UTI's   Pyelonephritis during pregnancy 2021    Past Surgical History:  Procedure Laterality Date   CYST REMOVAL HAND     FRACTURE SURGERY Left    leg    Family History  Problem Relation Age of Onset   Birth defects Paternal Aunt        spina bifida   Hypertension Paternal Grandmother     Social History   Tobacco Use   Smoking status: Never   Smokeless tobacco: Never  Substance Use Topics   Alcohol use: No    Comment: occasional,, not now   Drug use: No    Allergies: No Active Allergies  Medications Prior to Admission  Medication Sig Dispense Refill Last Dose   acetaminophen (TYLENOL) 500 MG tablet Take 1 tablet (500 mg total) by mouth every 6 (six) hours as needed. 30 tablet 0 05/11/2021   butalbital-acetaminophen-caffeine (FIORICET) 50-325-40 MG tablet Take 2 tablets by  mouth every 6 (six) hours as needed for headache. 14 tablet 0 Past Week   iron polysaccharides (NIFEREX) 150 MG capsule Take 1 capsule (150 mg total) by mouth daily. 30 capsule 1 Past Week   magnesium oxide (MAG-OX) 400 (240 Mg) MG tablet Take 1 tablet (400 mg total) by mouth daily. 30 tablet 1 05/11/2021   Prenatal Vit-Fe Fumarate-FA (PRENATAL MULTIVITAMIN) TABS tablet Take 1 tablet by mouth daily at 12 noon.   05/10/2021   valACYclovir (VALTREX) 1000 MG tablet Take 1,000 mg by mouth 2 (two) times daily.   Past Month    Review of Systems  Constitutional:  Negative for fever.  Eyes:  Positive for photophobia and visual disturbance.  Cardiovascular:  Positive for leg swelling.  Gastrointestinal:  Negative for abdominal pain, constipation, diarrhea, nausea and vomiting.  Neurological:  Positive for headaches.  Physical Exam   Blood pressure (!) 151/77, pulse 73, temperature 98.5 F (36.9 C), resp. rate 18, height 5\' 3"  (1.6 m), weight 108.4 kg, last menstrual period 09/01/2020, SpO2 98 %, unknown if currently breastfeeding.  Physical Exam  Vitals and nursing note reviewed.  Constitutional:      General: She is not in acute distress.    Appearance: She is obese. She is not ill-appearing.  HENT:     Head: Normocephalic.  Cardiovascular:     Rate and Rhythm: Normal rate.     Heart sounds: No murmur heard. Pulmonary:     Effort: Pulmonary effort is normal.     Breath sounds: No wheezing.  Abdominal:     General: There is no distension.     Palpations: Abdomen is soft. There is no mass.     Tenderness: There is no abdominal tenderness. There is no guarding.  Musculoskeletal:        General: Swelling (2+ pitting edema bilaterally to shin) present.  Skin:    General: Skin is warm and dry.     Findings: No erythema.  Neurological:     Mental Status: She is alert and oriented to person, place, and time.     Deep Tendon Reflexes:     Reflex Scores:      Bicep reflexes are 2+ on the right  side and 2+ on the left side.      Brachioradialis reflexes are 2+ on the right side and 2+ on the left side.      Patellar reflexes are 2+ on the right side and 2+ on the left side.    Comments: No clonus  Psychiatric:        Mood and Affect: Mood normal.        Behavior: Behavior normal.    Results for orders placed or performed during the hospital encounter of 05/11/21 (from the past 24 hour(s))  Protein / creatinine ratio, urine     Status: Abnormal   Collection Time: 05/11/21  5:12 AM  Result Value Ref Range   Creatinine, Urine 29.07 mg/dL   Total Protein, Urine 20 mg/dL   Protein Creatinine Ratio 0.69 (H) 0.00 - 0.15 mg/mg[Cre]  Urinalysis, Routine w reflex microscopic     Status: Abnormal   Collection Time: 05/11/21  5:12 AM  Result Value Ref Range   Color, Urine YELLOW YELLOW   APPearance HAZY (A) CLEAR   Specific Gravity, Urine 1.008 1.005 - 1.030   pH 7.0 5.0 - 8.0   Glucose, UA NEGATIVE NEGATIVE mg/dL   Hgb urine dipstick SMALL (A) NEGATIVE   Bilirubin Urine NEGATIVE NEGATIVE   Ketones, ur NEGATIVE NEGATIVE mg/dL   Protein, ur NEGATIVE NEGATIVE mg/dL   Nitrite NEGATIVE NEGATIVE   Leukocytes,Ua SMALL (A) NEGATIVE   RBC / HPF 0-5 0 - 5 RBC/hpf   WBC, UA 0-5 0 - 5 WBC/hpf   Bacteria, UA RARE (A) NONE SEEN   Squamous Epithelial / LPF 11-20 0 - 5  CBC with Differential/Platelet     Status: Abnormal   Collection Time: 05/11/21  5:22 AM  Result Value Ref Range   WBC 15.2 (H) 4.0 - 10.5 K/uL   RBC 3.56 (L) 3.87 - 5.11 MIL/uL   Hemoglobin 9.6 (L) 12.0 - 15.0 g/dL   HCT 45.429.9 (L) 09.836.0 - 11.946.0 %   MCV 84.0 80.0 - 100.0 fL   MCH 27.0 26.0 - 34.0 pg   MCHC 32.1 30.0 - 36.0 g/dL   RDW 14.715.6 (H) 82.911.5 - 56.215.5 %   Platelets 309 150 - 400 K/uL   nRBC 0.0 0.0 - 0.2 %   Neutrophils Relative % 78 %   Neutro Abs 11.8 (H) 1.7 - 7.7 K/uL  Lymphocytes Relative 12 %   Lymphs Abs 1.8 0.7 - 4.0 K/uL   Monocytes Relative 7 %   Monocytes Absolute 1.1 (H) 0.1 - 1.0 K/uL   Eosinophils  Relative 0 %   Eosinophils Absolute 0.0 0.0 - 0.5 K/uL   Basophils Relative 0 %   Basophils Absolute 0.0 0.0 - 0.1 K/uL   Immature Granulocytes 3 %   Abs Immature Granulocytes 0.45 (H) 0.00 - 0.07 K/uL  Comprehensive metabolic panel     Status: Abnormal   Collection Time: 05/11/21  5:22 AM  Result Value Ref Range   Sodium 136 135 - 145 mmol/L   Potassium 3.9 3.5 - 5.1 mmol/L   Chloride 106 98 - 111 mmol/L   CO2 21 (L) 22 - 32 mmol/L   Glucose, Bld 103 (H) 70 - 99 mg/dL   BUN 10 6 - 20 mg/dL   Creatinine, Ser 7.42 0.44 - 1.00 mg/dL   Calcium 9.4 8.9 - 59.5 mg/dL   Total Protein 5.9 (L) 6.5 - 8.1 g/dL   Albumin 2.7 (L) 3.5 - 5.0 g/dL   AST 22 15 - 41 U/L   ALT 14 0 - 44 U/L   Alkaline Phosphatase 84 38 - 126 U/L   Total Bilirubin 0.3 0.3 - 1.2 mg/dL   GFR, Estimated >63 >87 mL/min   Anion gap 9 5 - 15    Fetal Monitoring: Baseline: 140 bpm Variability: moderate Accelerations: 15 x 15 Decelerations: none Contractions: none  Patient Vitals for the past 24 hrs:  BP Temp Pulse Resp SpO2 Height Weight  05/11/21 0705 (!) 151/77 -- 73 -- -- -- --  05/11/21 0701 (!) 163/90 -- 78 -- -- -- --  05/11/21 0700 -- -- -- -- 98 % -- --  05/11/21 0655 -- -- -- -- 98 % -- --  05/11/21 0650 -- -- -- -- 99 % -- --  05/11/21 0646 123/76 -- 72 -- -- -- --  05/11/21 0645 -- -- -- -- 97 % -- --  05/11/21 0640 -- -- -- -- 97 % -- --  05/11/21 0635 -- -- -- -- 97 % -- --  05/11/21 0631 128/71 -- 79 -- -- -- --  05/11/21 0630 -- -- -- -- 97 % -- --  05/11/21 0625 -- -- -- -- 97 % -- --  05/11/21 0620 -- -- -- -- 97 % -- --  05/11/21 0616 137/68 -- 74 -- -- -- --  05/11/21 0615 -- -- -- -- 98 % -- --  05/11/21 0610 -- -- -- -- 98 % -- --  05/11/21 0605 -- -- -- -- 99 % -- --  05/11/21 0601 138/68 -- 78 -- -- -- --  05/11/21 0600 -- -- -- -- 98 % -- --  05/11/21 0554 (!) 141/83 -- 79 -- -- -- --  05/11/21 0535 (!) 148/76 -- 83 -- -- -- --  05/11/21 0531 -- 98.5 F (36.9 C) -- 18 -- 5\' 3"   (1.6 m) 108.4 kg    MAU Course  Procedures None  MDM UA, Urine protein/creatinine ratio, CBC, CMP Serial BPs PO Fioricet given today - patient reports no improvement in headache after 40 minutes Discussed patient with Dr. . Recommends admission for IOL due to pre-eclampsia  Discussed patient with Dr. Macon Large. Agrees with recommendation for admission, will discuss with Normand Sloop, CNM to put in orders.  Assessment and Plan  A: [facility] at [redacted]w[redacted]d Pre-eclampsia  Headache   P: Admit to L&D for IOL  Vonzella Nipple, PA-C 05/11/2021, 7:12 AM

## 2021-05-12 ENCOUNTER — Encounter (HOSPITAL_COMMUNITY): Payer: Self-pay | Admitting: Obstetrics and Gynecology

## 2021-05-12 LAB — CBC
HCT: 30.3 % — ABNORMAL LOW (ref 36.0–46.0)
Hemoglobin: 9.7 g/dL — ABNORMAL LOW (ref 12.0–15.0)
MCH: 27.1 pg (ref 26.0–34.0)
MCHC: 32 g/dL (ref 30.0–36.0)
MCV: 84.6 fL (ref 80.0–100.0)
Platelets: 308 10*3/uL (ref 150–400)
RBC: 3.58 MIL/uL — ABNORMAL LOW (ref 3.87–5.11)
RDW: 15.9 % — ABNORMAL HIGH (ref 11.5–15.5)
WBC: 13.4 10*3/uL — ABNORMAL HIGH (ref 4.0–10.5)
nRBC: 0 % (ref 0.0–0.2)

## 2021-05-12 LAB — RPR: RPR Ser Ql: NONREACTIVE

## 2021-05-12 MED ORDER — DIPHENHYDRAMINE HCL 50 MG/ML IJ SOLN: 12.5000 mg | INTRAMUSCULAR | Status: DC | PRN

## 2021-05-12 MED ORDER — TRANEXAMIC ACID-NACL 1000-0.7 MG/100ML-% IV SOLN
INTRAVENOUS | Status: AC
  Filled 2021-05-12: qty 100

## 2021-05-12 MED ORDER — MAGNESIUM SULFATE 40 GM/1000ML IV SOLN
2.0000 g/h | INTRAVENOUS | Status: AC
  Administered 2021-05-12: 2 g/h via INTRAVENOUS
  Filled 2021-05-12: qty 1000

## 2021-05-12 MED ORDER — DIPHENHYDRAMINE HCL 50 MG/ML IJ SOLN
INTRAMUSCULAR | Status: AC
  Administered 2021-05-12: 12.5 mg via INTRAVENOUS
  Filled 2021-05-12: qty 1

## 2021-05-12 MED ORDER — LACTATED RINGERS IV SOLN: INTRAVENOUS | Status: DC

## 2021-05-12 MED ORDER — ZOLPIDEM TARTRATE 5 MG PO TABS: 5.0000 mg | ORAL_TABLET | Freq: Every evening | ORAL | Status: DC | PRN

## 2021-05-12 MED ORDER — WITCH HAZEL-GLYCERIN EX PADS: 1.0000 "application " | MEDICATED_PAD | CUTANEOUS | Status: DC | PRN

## 2021-05-12 MED ORDER — DIBUCAINE (PERIANAL) 1 % EX OINT: 1.0000 "application " | TOPICAL_OINTMENT | CUTANEOUS | Status: DC | PRN

## 2021-05-12 MED ORDER — PRENATAL MULTIVITAMIN CH
1.0000 | ORAL_TABLET | Freq: Every day | ORAL | Status: DC
  Administered 2021-05-12 – 2021-05-15 (×4): 1 via ORAL
  Filled 2021-05-12 (×4): qty 1

## 2021-05-12 MED ORDER — ACETAMINOPHEN 325 MG PO TABS
650.0000 mg | ORAL_TABLET | ORAL | Status: DC | PRN
  Administered 2021-05-14: 650 mg via ORAL
  Filled 2021-05-12: qty 2

## 2021-05-12 MED ORDER — DIPHENHYDRAMINE HCL 25 MG PO CAPS: 25.0000 mg | ORAL_CAPSULE | Freq: Four times a day (QID) | ORAL | Status: DC | PRN

## 2021-05-12 MED ORDER — IBUPROFEN 600 MG PO TABS
600.0000 mg | ORAL_TABLET | Freq: Four times a day (QID) | ORAL | Status: DC
  Administered 2021-05-12 – 2021-05-15 (×12): 600 mg via ORAL
  Filled 2021-05-12 (×12): qty 1

## 2021-05-12 MED ORDER — ONDANSETRON HCL 4 MG/2ML IJ SOLN: 4.0000 mg | INTRAMUSCULAR | Status: DC | PRN

## 2021-05-12 MED ORDER — ONDANSETRON HCL 4 MG PO TABS: 4.0000 mg | ORAL_TABLET | ORAL | Status: DC | PRN

## 2021-05-12 MED ORDER — FERROUS SULFATE 325 (65 FE) MG PO TABS
325.0000 mg | ORAL_TABLET | Freq: Two times a day (BID) | ORAL | Status: DC
  Administered 2021-05-12 – 2021-05-13 (×2): 325 mg via ORAL
  Filled 2021-05-12 (×2): qty 1

## 2021-05-12 MED ORDER — COCONUT OIL OIL: 1.0000 "application " | TOPICAL_OIL | Status: DC | PRN

## 2021-05-12 MED ORDER — TETANUS-DIPHTH-ACELL PERTUSSIS 5-2.5-18.5 LF-MCG/0.5 IM SUSY
0.5000 mL | PREFILLED_SYRINGE | Freq: Once | INTRAMUSCULAR | Status: AC
  Administered 2021-05-14: 0.5 mL via INTRAMUSCULAR
  Filled 2021-05-12: qty 0.5

## 2021-05-12 MED ORDER — NALBUPHINE HCL 10 MG/ML IJ SOLN
10.0000 mg | Freq: Once | INTRAMUSCULAR | Status: AC
Start: 2021-05-12 — End: 2021-05-12
  Administered 2021-05-12: 10 mg via INTRAVENOUS
  Filled 2021-05-12: qty 1

## 2021-05-12 MED ORDER — OXYCODONE HCL 5 MG PO TABS: 10.0000 mg | ORAL_TABLET | ORAL | Status: DC | PRN

## 2021-05-12 MED ORDER — SENNOSIDES-DOCUSATE SODIUM 8.6-50 MG PO TABS
2.0000 | ORAL_TABLET | Freq: Every day | ORAL | Status: DC
  Administered 2021-05-13 – 2021-05-15 (×3): 2 via ORAL
  Filled 2021-05-12 (×3): qty 2

## 2021-05-12 MED ORDER — BENZOCAINE-MENTHOL 20-0.5 % EX AERO
1.0000 | INHALATION_SPRAY | CUTANEOUS | Status: DC | PRN
Start: 2021-05-12 — End: 2021-05-15

## 2021-05-12 MED ORDER — SIMETHICONE 80 MG PO CHEW: 80.0000 mg | CHEWABLE_TABLET | ORAL | Status: DC | PRN

## 2021-05-12 MED ORDER — OXYCODONE HCL 5 MG PO TABS
5.0000 mg | ORAL_TABLET | ORAL | Status: DC | PRN
Start: 2021-05-12 — End: 2021-05-15
  Administered 2021-05-14: 5 mg via ORAL
  Filled 2021-05-12: qty 1

## 2021-05-12 MED ORDER — HYDROXYZINE HCL 50 MG/ML IM SOLN
50.0000 mg | Freq: Four times a day (QID) | INTRAMUSCULAR | Status: DC | PRN
  Administered 2021-05-12: 50 mg via INTRAMUSCULAR
  Filled 2021-05-12 (×2): qty 1

## 2021-05-12 NOTE — Progress Notes (Signed)
Subjective:    Pain is relieved w/ epidural, but itching is uncomfortable per pt. Benadryl has not relieved itching, discussed Nubain and pt agrees. Pt discouraged by slow cervical change. Induction expectations w/ magnesium sulfate in use reviewed and pt reassured.   Objective:    VS: BP 133/77   Pulse 78   Temp 98.4 F (36.9 C) (Oral)   Resp 17   Ht 5\' 3"  (1.6 m)   Wt 108.4 kg   LMP 09/01/2020   SpO2 99%   BMI 42.34 kg/m  FHR : baseline 125 / variability moderate / accelerations present / absent decelerations Toco: contractions every 2-4 minutes Membranes: intact Dilation: 5.5 Effacement (%): 80 Cervical Position: Midposition Station: Ballotable Presentation: Vertex Exam by:: [email or web address] CNM Pitocin 18 mU/min  Assessment/Plan:   30 y.o. 32 [redacted]w[redacted]d IOL for pre-e severe features    -no HA and no visual changes since admission  Labor:  protracted latent phase, change position to facilitate decent, increase Pitocin to a max of 30 mU/min, amniotomy when appropriate Preeclampsia:  on magnesium sulfate, no signs or symptoms of toxicity, intake and ouput balanced, labs stable, and no severe range BP since admission Fetal Wellbeing:  Category I Pain Control:  Epidural I/D:   GBS positive, PCN x4 doses Anticipated MOD:  NSVD  [redacted]w[redacted]d MSN, CNM 05/12/2021 3:14 AM

## 2021-05-12 NOTE — Progress Notes (Signed)
Labor Progress Note  Peggy Mckenzie is a 30 y.o. female [phone] admitted on 7/3 at [redacted]w[redacted]d presenting for HA x24 hrs and seeing spots. HA refractory to Tylenol. New dx of preE with SF today. Started on mag. Admitted for new onset GHTN on 7/1 w/ urine protein creatine ratio of 0.18, rec'vd antenatal steroids, and dc'd home on Fioricet, Magnesium PO, and iron for anemia. Hospitalized for pyelo in 2nd trimester, no urinary complaints today. Hx of HSV-initial outbreak in 09/2020, last outbreak 04/28/2021. Pt reports taking Valtrex 1000 mg PO daily since Nov 2021. Denies prodromal S/S today. Hx of bipolar depression-takes no meds.  Subjective: Assumed care of pt at 0700, reported in room at 0930, discussed POC, discussed R/B/A of AROM, IUPC, Pitocin, pt verbalized consent. Pt was AROM' ed clear fluid, moderate amount at 0940, fetus was station -2 during cxt at that time, post AROM a cord was felt, RN placed pt in trendelenburg immediately, then felt fundal pressure, within 1 second no cord was felt, fetus was station -2, stable in pelvis, Cat 1 strip entire time, reviewed with pt what was felt, pt educated the increased risk of this happening again since the fetus was small in creased and offered to continue with expectant management or PCS, pt desires to continue with IOL, and aware of increased risk of STAT PCS if a cord is palpated. DR Dillard called at 1007 and updated about pt care and event. DR Normand Sloop ok to proceed with expectant management. IUPC Placed during time of exam, pt tolerated well. MVU 180, pit 32, will continue to increase.  Patient Active Problem List   Diagnosis Date Noted   Bipolar depression (HCC) 05/11/2021   HSV infection 05/11/2021   Preeclampsia, severe, third trimester 05/11/2021   Gestational hypertension 05/08/2021   Objective: BP (!) 141/85   Pulse 78   Temp 97.7 F (36.5 C) (Oral)   Resp 16   Ht 5\' 3"  (1.6 m)   Wt 108.4 kg   LMP 09/01/2020   SpO2 99%   BMI 42.34  kg/m  I/O last 3 completed shifts: In: 5246 [P.O.:1760; I.V.:2934.4; Other:31.6; IV Piggyback:520] Out: 7250 [Urine:7250] Total I/O In: 519.1 [P.O.:350; I.V.:169.1] Out: 650 [Urine:650] NST: FHR baseline 130 bpm, Variability: moderate, Accelerations:present, Decelerations:  Absent= Cat 1/Reactive CTX:  regular, every 2-3 minutes, lasting 50-90 seconds Uterus gravid, soft non tender, moderate to palpate with contractions.  SVE:  Dilation: 5 Effacement (%): 60 Station: -2 Exam by:: [facility], CNM Pitocin at 32 mUn/min AROM, clear, tolerated well.  IUPC placed tolerated well.   Assessment:  Peggy Mckenzie is a 30 y.o. female [phone] admitted on 7/3 at [redacted]w[redacted]d presenting for HA x24 hrs and seeing spots. HA refractory to Tylenol. New dx of preE with SF today. Started on mag. Admitted for new onset GHTN on 7/1 w/ urine protein creatine ratio of 0.18, rec'vd antenatal steroids, and dc'd home on Fioricet, Magnesium PO, and iron for anemia. Hospitalized for pyelo in 2nd trimester, no urinary complaints today. Hx of HSV-initial outbreak in 09/2020, last outbreak 04/28/2021. Pt reports taking Valtrex 1000 mg PO daily since Nov 2021. Denies prodromal S/S today. Hx of bipolar depression-takes no meds. Patient Active Problem List   Diagnosis Date Noted   Bipolar depression (HCC) 05/11/2021   HSV infection 05/11/2021   Preeclampsia, severe, third trimester 05/11/2021   Gestational hypertension 05/08/2021   NICHD: Category 1  Membranes:  AROM, clear mod @ 0940 on 7/4 x 0hrs, no s/s of infection  MVUs 180  Induction:    Cytotec x 1049 on 7/3  Foley Bulb: Never inserted.   Pitocin - 32  Pain management:               IV pain management: x Fentanyl 1601 on 7/3             Epidural placement:  at 2009 on 7/3  GBS Posterior  Abx: 7/3 @ 1406, 1737, 2203, 7/4 0255, 0615, 1610  Anesthesia induced puritis: S/P IV benadryl, vistartil and nubain, pt feels better after nubain and vistaril.    PreE with SF: BP 141/85, asymptomatic, stable, on mag 2gm/hr, no antihypertensive, PCR 0.69, other labs unremarkable.    Plan: Continue labor plan Continuous monitoring Rest Frequent position changes to facilitate fetal rotation and descent. Will reassess with cervical exam at 4 or earlier if necessary PreE with SF: Monitor BP, continue mag for 24 hour PP, repeat cmp in morning. Labetalol protocol PRN.  Puritiis: cole raggs, vistaril PRN.  Bipolar: Monitor mood PP, discuss R/B/A of start mood stabilizer if indicated PP and risk of breastfeeding.  GBS+: Contineu PCN Q4H until delivery.  Continue pitocin per protocol Anticipate labor progression and vaginal delivery.  If prolapsed cord noted will proceed with PCS.   Md Dillard aware of plan and verbalized agreement.   Dale [place], NP-C, CNM, MSN 05/12/2021. 10:38 AM

## 2021-05-12 NOTE — Lactation Note (Signed)
This note was copied from a baby's chart. Lactation Consultation Note  Patient Name: Peggy Mckenzie [id number] Date: 05/12/2021 Reason for consult: L&D Initial assessment;1st time breastfeeding;Late-preterm 34-36.6wks;Other (Comment) (LC LD visit at PP , P4 , 1st time BF and per mom due to formula shortage desires to give BF a try. baby STS on mom chest/ awake and rooting/ latched for 10 mins with swallows / increased w/compressions. LC spoon fed 2 ml / easily hand exp.) Age:39 mins PP  LC discussed the potential feeding behavior of a LPT baby .  Importance of STS . And mom is aware there will be a LD visit later today and a DEBP will need to be set up.   Maternal Data Has patient been taught Hand Expression?: Yes Does the patient have breastfeeding experience prior to this delivery?: No  Feeding Mother's Current Feeding Choice: Breast Milk  LATCH Score Latch: Repeated attempts needed to sustain latch, nipple held in mouth throughout feeding, stimulation needed to elicit sucking reflex.  Audible Swallowing: Spontaneous and intermittent  Type of Nipple: Everted at rest and after stimulation (some areola edema - will need breast shells)  Comfort (Breast/Nipple): Soft / non-tender  Hold (Positioning): Assistance needed to correctly position infant at breast and maintain latch.  LATCH Score: 8   Lactation Tools Discussed/Used    Interventions Interventions: Breast feeding basics reviewed;Assisted with latch;Skin to skin;Breast massage;Hand express;Breast compression;Adjust position;Education  Discharge    Consult Status Consult Status: [name] (from LD) Date: 05/12/21 [name] type: In-patient    Peggy Mckenzie 05/12/2021, 12:41 PM

## 2021-05-12 NOTE — Anesthesia Postprocedure Evaluation (Signed)
Anesthesia Post Note  Patient: Peggy Mckenzie  Procedure(s) Performed: AN AD HOC LABOR EPIDURAL     Patient location during evaluation: Mother Baby Anesthesia Type: Epidural Level of consciousness: awake and alert Pain management: pain level controlled Vital Signs Assessment: post-procedure vital signs reviewed and stable Respiratory status: spontaneous breathing, nonlabored ventilation and respiratory function stable Cardiovascular status: stable Postop Assessment: no headache, no backache, epidural receding, no apparent nausea or vomiting, patient able to bend at knees, adequate PO intake and able to ambulate Anesthetic complications: no   No notable events documented.  Last Vitals:  Vitals:   05/12/21 1500 05/12/21 1807  BP:  (!) 104/54  Pulse:  76  Resp:  17  Temp:  36.9 C  SpO2: 97% 97%    Last Pain:  Vitals:   05/12/21 1807  TempSrc: Oral  PainSc:    Pain Goal: Patients Stated Pain Goal: 0 (05/11/21 0537)                 Laban Emperor

## 2021-05-12 NOTE — Progress Notes (Signed)
HA resolved w/ Fioricet and vision is clear per pt.

## 2021-05-12 NOTE — Progress Notes (Signed)
Delivery Note   Patient Name: Peggy Mckenzie DOB: 07/25/1991 MRN: [id number]  Date of admission: 05/11/2021 Delivering MD: Dale Deltana  Date of delivery: 05/12/21 Type of delivery: SVD  Newborn Data: Live born female  Birth Weight: 6 lb 11.9 oz (3059 g) APGAR: 9, 9  Newborn Delivery   Birth date/time: 05/12/2021 11:40:00 Delivery type: Vaginal, Spontaneous    Peggy Mckenzie, 30 y.o., @ [redacted]w[redacted]d,  [phone], who was admitted for REJOICE HEATWOLE is a 30 y.o. female [phone] admitted on 7/3 at [redacted]w[redacted]d presenting for HA x24 hrs and seeing spots. HA refractory to Tylenol. New dx of preE with SF today. Started on mag. Admitted for new onset GHTN on 7/1 w/ urine protein creatine ratio of 0.18, rec'vd antenatal steroids, and dc'd home on Fioricet, Magnesium PO, and iron for anemia. Hospitalized for pyelo in 2nd trimester, no urinary complaints today. Hx of HSV-initial outbreak in 09/2020, last outbreak 04/28/2021. Pt reports taking Valtrex 1000 mg PO daily since Nov 2021. Denies prodromal S/S today. Hx of bipolar depression-takes no meds, mood stable declined meds PP. Pt progressed with pticon at  34, MVU 230s, and AROm with cord palpated and able to replace with Cat 1 srtrip, I went to pt room after noted early and varibles noted to nadir of 90s with spotnaeous return, Cat 2 noted, small fluid bolus given and baseline 140 in betwee cxt, pt progressed very quickly. Pt progressed . I was called to the room when she progressed 2+ station in the second stage of labor.  She pushed for 5/min.  She delivered a viable infant, cephalic and restituted to the LOA position over an intact perineum.  A nuchal cord   was identified and left compound hand. The baby was placed on maternal abdomen while initial step of NRP were perfmored (Dry, Stimulated, and warmed). Hat placed on baby for thermoregulation. Delayed cord clamping was performed for 2 minutes.  Cord double clamped and cut.  Cord cut by father. Apgar scores  were 9 and 9. Prophylactic Pitocin was started in the third stage of labor for active management. The placenta delivered spontaneously, shultz, with a 3 vessel cord, marginal cord insertion and was sent to pathology.  Inspection revealed none. An examination of the vaginal vault and cervix was free from lacerations. The uterus was firm, bleeding stable.  Umbilical artery blood gas were not sent.  There were no complications during the procedure.  Mom and baby skin to skin following delivery. Left in stable condition. Pt denies HA, RUQ pain or vision changes. BP normotensive. On mag will continue on 2gm/hr for 24 hours repeat cbc, cmp in morning.  BP 127/80   Pulse 76   Temp 97.7 F (36.5 C) (Oral)   Resp 18   Ht 5\' 3"  (1.6 m)   Wt 108.4 kg   LMP 09/01/2020   SpO2 99%   BMI 42.34 kg/m    Maternal Info: Anesthesia: Epidural Episiotomy: no Lacerations:  no Suture Repair: no Est. Blood Loss (mL):  09/03/2020  Newborn Info:  Baby Sex: female Circumcision: ??? Babies Name: APGAR (1 MIN): 9   APGAR (5 MINS): 9   APGAR (10 MINS):     Mom to postpartum.  Baby to Couplet care / Skin to Skin.  DR Kandis Mannan updated.   Medon, LAHOLM, NP-C 05/12/21 1:25 PM

## 2021-05-13 LAB — CBC
HCT: 26.1 % — ABNORMAL LOW (ref 36.0–46.0)
Hemoglobin: 8 g/dL — ABNORMAL LOW (ref 12.0–15.0)
MCH: 26.3 pg (ref 26.0–34.0)
MCHC: 30.7 g/dL (ref 30.0–36.0)
MCV: 85.9 fL (ref 80.0–100.0)
Platelets: 271 10*3/uL (ref 150–400)
RBC: 3.04 MIL/uL — ABNORMAL LOW (ref 3.87–5.11)
RDW: 15.9 % — ABNORMAL HIGH (ref 11.5–15.5)
WBC: 10.1 10*3/uL (ref 4.0–10.5)
nRBC: 0 % (ref 0.0–0.2)

## 2021-05-13 LAB — COMPREHENSIVE METABOLIC PANEL
ALT: 21 U/L (ref 0–44)
AST: 22 U/L (ref 15–41)
Albumin: 2.2 g/dL — ABNORMAL LOW (ref 3.5–5.0)
Alkaline Phosphatase: 66 U/L (ref 38–126)
Anion gap: 7 (ref 5–15)
BUN: 15 mg/dL (ref 6–20)
CO2: 24 mmol/L (ref 22–32)
Calcium: 6.9 mg/dL — ABNORMAL LOW (ref 8.9–10.3)
Chloride: 102 mmol/L (ref 98–111)
Creatinine, Ser: 0.98 mg/dL (ref 0.44–1.00)
GFR, Estimated: >60 mL/min (ref >60)
Glucose, Bld: 106 mg/dL — ABNORMAL HIGH (ref 70–99)
Potassium: 4.1 mmol/L (ref 3.5–5.1)
Sodium: 133 mmol/L — ABNORMAL LOW (ref 135–145)
Total Bilirubin: 0.4 mg/dL (ref 0.3–1.2)
Total Protein: 4.8 g/dL — ABNORMAL LOW (ref 6.5–8.1)

## 2021-05-13 MED ORDER — FERROUS SULFATE 325 (65 FE) MG PO TABS
325.0000 mg | ORAL_TABLET | ORAL | Status: DC
  Administered 2021-05-15: 325 mg via ORAL
  Filled 2021-05-13: qty 1

## 2021-05-13 MED ORDER — SODIUM CHLORIDE 0.9 % IV SOLN
500.0000 mg | Freq: Once | INTRAVENOUS | Status: AC
  Administered 2021-05-13: 500 mg via INTRAVENOUS
  Filled 2021-05-13: qty 25

## 2021-05-13 MED ORDER — SODIUM CHLORIDE 0.9 % IV SOLN: 200.0000 mg | Freq: Once | INTRAVENOUS | Status: DC

## 2021-05-13 NOTE — Clinical Social Work Maternal (Signed)
CLINICAL SOCIAL WORK MATERNAL/CHILD NOTE  Patient Details  Name: Peggy Mckenzie MRN: [id number] Date of Birth: Nov 10, 1990  Date:  05/13/2021  Clinical Social Worker Initiating Note:  Abundio Miu, [place] Date/Time: Initiated:  05/13/21/1545     Child's Name:  Peggy Mckenzie   Biological Parents:  Mother, Father (Father: Peggy Mckenzie)   Need for Interpreter:  None   Reason for Referral:  Behavioral Health Concerns   Address:  [address] [place], [place] [address]   Phone number:  [phone] (home)     Additional phone number:   Household Members/Support Persons (HM/SP):   Household Member/Support Person 1, Household Member/Support Person 2, Household Member/Support Person 3, Household Member/Support Person 4   HM/SP Name Relationship DOB or Age  HM/SP -[place] FOB    HM/SP -2 Peggy Mckenzie son 12/14/09  HM/SP -3 Peggy Mckenzie son 10/18/14  HM/SP -Peggy Mckenzie daughter 08/21/16  HM/SP -5        HM/SP -6        HM/SP -7        HM/SP -8          Natural Supports (not living in the home):      Professional Supports: None   Employment: Full-time   Type of Work: Economist   Education:  [place] arranged:    Museum/gallery curator Resources:  Multimedia programmer    Other Resources:  [facility]   Cultural/Religious Considerations Which May Impact Care:    Strengths:  Ability to meet basic needs  , Engineer, materials, Home prepared for child     Psychotropic Medications:         Pediatrician:    [place] area  Pediatrician List:   [facility]  [place]      Pediatrician Fax Number:    Risk Factors/Current Problems:  Mental Health Concerns     Cognitive State:  Able to Concentrate  , Alert  , Insightful  , Goal Oriented  , Linear Thinking     Mood/Affect:  Calm  , Interested  , Comfortable  , Relaxed     CSW Assessment: CSW  met with MOB at bedside to discuss consult for behavioral health concerns, FOB present and laying on the couch. CSW introduced self and explained role. MOB was welcoming, open and remained engaged during assessment. MOB reported that she resides with FOB and older children. 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 MOB reported that she receives [facility] and has all items needed to care for infant including a car seat and basinet. CSW inquired about MOB's support system, MOB reported that FOB is her support.   CSW provided review of Sudden Infant Death Syndrome (SIDS) precautions.    CSW asked FOB to leave the room to speak with MOB privately, FOB left the room.   CSW inquired about MOB's mental health history. MOB reported that she was diagnosed with Bipolar Depression in 2011 or 2012 after having her first son. MOB described her Bipolar Depression as initially being manic and making irrational decisions. MOB reported that now it's just depression. MOB reported that the last time she experienced symptoms was last summer 2021. MOB shared that stress can attribute to her symptoms. MOB denied any auditory/visual hallucinations in the past related to her Bipolar Depression. MOB reported that she has taken  [name] in the past which is helpful. MOB reported that she plans to restart [name] eventually when she is no longer pumping. MOB reported that she feels she has been managing well off her medication. CSW asked if MOB was interested in any therapy resources, MOB declined and reported that she feels she is doing good. CSW inquired about MOB's coping skills, MOB reported that talking to a friend and crying is helpful. MOB endorsed having postpartum depression after her last pregnancy. MOB reported that she started medication the same week she gave birth because she was depressed during her pregnancy. MOB was unable to recall how [name] her postpartum depression lasted and reported that the  medication was helpful. MOB shared that she went to work earlier than her previous pregnancies to regain a sense of normalcy with a routine. CSW inquired about how MOB was feeling emotionally since giving birth, MOB reported that she was feeling good. MOB presented calm and did not demonstrate any acute mental health signs/symptoms. CSW assessed for safety, MOB denied SI, HI and domestic violence.   CSW provided education regarding the baby blues period vs. perinatal mood disorders, discussed treatment and gave resources for mental health follow up if concerns arise.  CSW recommends self-evaluation during the postpartum time period using the New Mom Checklist from Postpartum Progress and encouraged MOB to contact a medical professional if symptoms are noted at any time. CSW encouraged MOB to follow up with her OB provider if any symptoms of her past experience of PPD arise, MOB verbalized understanding.   CSW identifies no further need for intervention and no barriers to discharge at this time.    CSW Plan/Description:  Sudden Infant Death Syndrome (SIDS) Education, Perinatal Mood and Anxiety Disorder (PMADs) Education, No Further Intervention Required/No Barriers to Discharge    Burnis Medin, LCSW 05/13/2021, 3:49 PM

## 2021-05-13 NOTE — Progress Notes (Signed)
PPD# 1 SVD w/  Information for the patient's newborn:  Shariyah, Eland [[id number]]  female   Baby Boy Circumcision completed 05/13/21   S:   Reports feeling "tired" Tolerating PO fluid and solids No nausea or vomiting Bleeding is light Pain controlled with acetaminophen and ibuprofen (OTC) Up ad lib / ambulatory / voiding w/o difficulty Feeding: Breast    O:   VS: BP 118/63 (BP Location: Left Arm)   Pulse 66   Temp 98 F (36.7 C) (Oral)   Resp 18   Ht 5\' 3"  (1.6 m)   Wt 108.4 kg   LMP 09/01/2020   SpO2 98%   Breastfeeding Unknown   BMI 42.34 kg/m   LABS:  Recent Labs    05/12/21 1330 05/13/21 0550  WBC 13.4* 10.1  HGB 9.7* 8.0*  PLT 308 271   Blood type: --/--/O POS (03/04 2104) Rubella: Immune (12/15 0000)                      I&O: Intake/Output      07/04 0701 07/05 0700 07/05 0701 07/06 0700   P.O. 1780 120   I.V. (mL/kg) 2285.8 (21.1)    Other     IV Piggyback     Total Intake(mL/kg) 4065.8 (37.5) 120 (1.1)   Urine (mL/kg/hr) 4225 (1.6) 1700 (4.1)   Blood 375    Total Output 4600 1700   Net -534.2 -1580        Urine Occurrence 1 x      Physical Exam: Alert and oriented X3 Lungs: Clear and unlabored Heart: regular rate and rhythm / no mumurs Abdomen: soft, non-tender, non-distended  Fundus: firm, non-tender, U-1 Perineum: intact Lochia: minimal Extremities: generalized edema BLE, no calf pain or tenderness    A:  PPD # 1  Normal exam  P:  Routine post partum orders IV Iron infusion today MgSO4 to be d/c @ 1140   Plan reviewed w/ Dr. 09/06, MSN, CNM 05/13/2021, 10:49 AM

## 2021-05-14 LAB — SURGICAL PATHOLOGY

## 2021-05-14 NOTE — Progress Notes (Signed)
PPD# 2 SVD w/ intact perineum Information for the patient's newborn:  Izabellah, Dadisman [[id number]]  female    S:   Reports feeling "much better". Infant must remain overnight for management of jaundice Tolerating PO fluid and solids No nausea or vomiting Bleeding is light, no clots Pain controlled with  PO meds Up ad lib / ambulatory / voiding w/o difficulty Feeding: Bottle and Breast    O:   VS: BP 138/69 (BP Location: Left Arm)   Pulse 74   Temp 98.7 F (37.1 C) (Oral)   Resp 18   Ht 5\' 3"  (1.6 m)   Wt 108.4 kg   LMP 09/01/2020   SpO2 100%   Breastfeeding Unknown   BMI 42.34 kg/m   LABS:  Recent Labs    05/12/21 1330 05/13/21 0550  WBC 13.4* 10.1  HGB 9.7* 8.0*  PLT 308 271   Blood type: --/--/O POS (03/04 2104) Rubella: Immune (12/15 0000)                      I&O: Intake/Output      07/05 0701 07/06 0700 07/06 0701 07/07 0700   P.O. 780 120   I.V. (mL/kg) 835.3 (7.7)    IV Piggyback 315    Total Intake(mL/kg) 1930.3 (17.8) 120 (1.1)   Urine (mL/kg/hr) 5100 (2) 1400 (2.6)   Blood     Total Output 5100 1400   Net -3169.7 -1280          Physical Exam: Alert and oriented X3 Lungs: Clear and unlabored Heart: regular rate and rhythm / no mumurs Abdomen: soft, non-tender, non-distended  Fundus: firm, non-tender, U-3 Perineum: intact Lochia: appropriate Extremities: 1+edema, no calf pain, cords, or tenderness    A:  PPD # 2  Pre-e w/ severe features    -S/P MgSO4 x 24 PP    -normotensive to severe range BP requiring IV antihypertensives     P:  Continue routine post partum orders Anticipate D/C on 05/15/21  Plan reviewed w/ Dr. 07/16/21, MSN, CNM 05/14/2021, 11:59 AM

## 2021-05-15 MED ORDER — FERROUS SULFATE 325 (65 FE) MG PO TABS: 325.0000 mg | ORAL_TABLET | ORAL | 1 refills | Status: DC

## 2021-05-15 MED ORDER — ACETAMINOPHEN 325 MG PO TABS: 650.0000 mg | ORAL_TABLET | ORAL | Status: AC | PRN

## 2021-05-15 MED ORDER — NIFEDIPINE ER OSMOTIC RELEASE 30 MG PO TB24
30.0000 mg | ORAL_TABLET | Freq: Every day | ORAL | Status: DC
  Administered 2021-05-15: 30 mg via ORAL
  Filled 2021-05-15: qty 1

## 2021-05-15 MED ORDER — NIFEDIPINE ER 30 MG PO TB24: 30.0000 mg | ORAL_TABLET | Freq: Every day | ORAL | 0 refills | Status: DC

## 2021-05-15 NOTE — Discharge Summary (Signed)
SVD OB Discharge Summary  Patient Name: Peggy Mckenzie DOB: [birth year] MRN: [id number]  Date of admission: 05/11/2021 Intrauterine pregnancy: [redacted]w[redacted]d   Admitting diagnosis: Preeclampsia, severe, third trimester [O14.13] Secondary diagnosis: None  Date of discharge: 05/15/2021    Discharge diagnosis: Preterm Pregnancy Delivered     Prenatal history: [id number]   EDC : 06/08/2021, by Last Menstrual Period  P[facility] at [facility]  Primary provider : [facility] Prenatal course complicated by preeclampsia  Prenatal Labs: ABO, Rh: --/--/O POS (03/04 2104) /  Antibody: NEG (03/04 2104) Rubella: Immune (12/15 0000)  / RPR: NON REACTIVE (07/03 1002)  HBsAg: Negative (12/15 0000)  HIV: Non-reactive (12/15 0000)  GBS: POSITIVE/-- (07/03 1045)                                    Hospital course:  Induction of Labor With Vaginal Delivery   30 y.o. yo [id number] at [redacted]w[redacted]d was admitted to the hospital 05/11/2021 for induction of labor.  Indication for induction: Preeclampsia.  Patient had an uncomplicated labor course as follows: Membrane Rupture Time/Date: 9:40 AM ,05/12/2021   Delivery Method:Vaginal, Spontaneous  Episiotomy: None  Lacerations:  None  Details of delivery can be found in separate delivery note.  Patient had a routine postpartum course. Patient is discharged home 05/15/21.  Newborn Data: Birth date:05/12/2021  Birth time:11:40 AM  Gender:Female  Living status:Living  Apgars:9 ,9  Weight:3059 g  Delivering PROVIDER: MONTANA, JADE                                                            Complications: None  Newborn Data: Live born female  Birth Weight: 6 lb 11.9 oz (3059 g) APGAR: 9, 9  Newborn Delivery   Birth date/time: 05/12/2021 11:40:00 Delivery type: Vaginal, Spontaneous      Baby Feeding: Bottle Disposition:home with mother  Post partum procedures: pp magnesium sulfate  Labs: Lab Results  Component Value Date   WBC 10.1 05/13/2021   HGB 8.0 (L) 05/13/2021   HCT  26.1 (L) 05/13/2021   MCV 85.9 05/13/2021   PLT 271 05/13/2021   CMP Latest Ref Rng & Units 05/13/2021  Glucose 70 - 99 mg/dL 992(E)  BUN 6 - 20 mg/dL 15  Creatinine 2.68 - 3.41 mg/dL 9.62  Sodium 229 - 798 mmol/L 133(L)  Potassium 3.5 - 5.1 mmol/L 4.1  Chloride 98 - 111 mmol/L 102  CO2 22 - 32 mmol/L 24  Calcium 8.9 - 10.3 mg/dL 6.9(L)  Total Protein 6.5 - 8.1 g/dL 4.8(L)  Total Bilirubin 0.3 - 1.2 mg/dL 0.4  Alkaline Phos 38 - 126 U/L 66  AST 15 - 41 U/L 22  ALT 0 - 44 U/L 21    Physical Exam @ time of discharge:  Vitals:   05/14/21 1929 05/14/21 2306 05/15/21 0411 05/15/21 0906  BP: (!) 144/67 138/86 138/81 (!) 144/85  Pulse: 76 80 68 79  Resp: 18 18 18 18   Temp: 98.4 F (36.9 C) 98.3 F (36.8 C) 98 F (36.7 C) 98.8 F (37.1 C)  TempSrc: Oral Oral Oral Oral  SpO2: 99% 100% 100% 97%  Weight:      Height:  general: alert, cooperative, and no distress lochia: appropriate uterine fundus: firm perineum: intact incision: N/A extremities: DVT Evaluation: No evidence of DVT seen on physical exam. Negative Homan's sign. No cords or calf tenderness.  Discharge instructions:  "Baby and Me Booklet" and [facility] Booklet Discharge Medications:  Allergies as of 05/15/2021   No Known Allergies      Medication List     STOP taking these medications    butalbital-acetaminophen-caffeine 50-325-40 MG tablet Commonly known as: FIORICET   iron polysaccharides 150 MG capsule Commonly known as: NIFEREX   valACYclovir 1000 MG tablet Commonly known as: VALTREX       TAKE these medications    acetaminophen 325 MG tablet Commonly known as: Tylenol Take 2 tablets (650 mg total) by mouth every 4 (four) hours as needed (for pain scale < 4). What changed:  medication strength how much to take when to take this reasons to take this   ferrous sulfate 325 (65 FE) MG tablet Take 1 tablet (325 mg total) by mouth every other day. Start taking on: May 17, 2021    magnesium oxide 400 (240 Mg) MG tablet Commonly known as: MAG-OX Take 1 tablet (400 mg total) by mouth daily.   NIFEdipine 30 MG 24 hr tablet Commonly known as: ADALAT CC Take 1 tablet (30 mg total) by mouth daily. Start taking on: May 16, 2021   prenatal multivitamin Tabs tablet Take 1 tablet by mouth daily at 12 noon.       Diet: routine diet Activity: Advance as tolerated. Pelvic rest x 6 weeks.  Follow up:1 week blood pressure and mood check 4-6 weeks postpartum visit   Signed: Carollee Leitz MSN, CNM 05/15/2021, 10:04 AM

## 2021-05-17 ENCOUNTER — Inpatient Hospital Stay (HOSPITAL_COMMUNITY): Payer: 59

## 2021-05-22 ENCOUNTER — Telehealth (HOSPITAL_COMMUNITY): Payer: Self-pay | Admitting: *Deleted

## 2021-05-22 DIAGNOSIS — H00012 Hordeolum externum right lower eyelid: Secondary | ICD-10-CM | POA: Diagnosis not present

## 2021-05-22 DIAGNOSIS — O165 Unspecified maternal hypertension, complicating the puerperium: Secondary | ICD-10-CM | POA: Diagnosis not present

## 2021-05-22 NOTE — Telephone Encounter (Signed)
Patient reports she is doing well.  No concerns regarding her healing.  EPDS today = 7, patient says she feels that this score is accurate and that overall she is doing well emotionally.  Patient reports that baby is doing great and that pediatrician is pleased with baby's growth and health status particularly in light of him being born early.  Patient says baby is currently sleeping alone, on his back, in a bassinet with nothing else in the bassinet with him.

## 2021-05-29 DIAGNOSIS — O165 Unspecified maternal hypertension, complicating the puerperium: Secondary | ICD-10-CM | POA: Diagnosis not present

## 2021-06-09 DIAGNOSIS — N390 Urinary tract infection, site not specified: Secondary | ICD-10-CM | POA: Diagnosis not present

## 2021-06-09 DIAGNOSIS — Z308 Encounter for other contraceptive management: Secondary | ICD-10-CM | POA: Diagnosis not present

## 2021-06-09 DIAGNOSIS — Z01419 Encounter for gynecological examination (general) (routine) without abnormal findings: Secondary | ICD-10-CM | POA: Diagnosis not present

## 2021-06-09 DIAGNOSIS — Z3490 Encounter for supervision of normal pregnancy, unspecified, unspecified trimester: Secondary | ICD-10-CM | POA: Diagnosis not present

## 2021-08-01 ENCOUNTER — Other Ambulatory Visit: Payer: Self-pay

## 2021-08-01 ENCOUNTER — Emergency Department (HOSPITAL_COMMUNITY)
Admission: EM | Admit: 2021-08-01 | Discharge: 2021-08-02 | Disposition: A | Discharge status: Home / Self Care | Payer: 59 | Attending: Emergency Medicine | Admitting: Emergency Medicine

## 2021-08-01 ENCOUNTER — Encounter (HOSPITAL_COMMUNITY): Payer: Self-pay

## 2021-08-01 ENCOUNTER — Emergency Department (HOSPITAL_COMMUNITY): Payer: 59

## 2021-08-01 DIAGNOSIS — N9489 Other specified conditions associated with female genital organs and menstrual cycle: Secondary | ICD-10-CM | POA: Insufficient documentation

## 2021-08-01 DIAGNOSIS — N61 Mastitis without abscess: Secondary | ICD-10-CM | POA: Diagnosis not present

## 2021-08-01 DIAGNOSIS — R509 Fever, unspecified: Secondary | ICD-10-CM | POA: Insufficient documentation

## 2021-08-01 DIAGNOSIS — R9431 Abnormal electrocardiogram [ECG] [EKG]: Secondary | ICD-10-CM | POA: Diagnosis not present

## 2021-08-01 DIAGNOSIS — N644 Mastodynia: Secondary | ICD-10-CM | POA: Diagnosis not present

## 2021-08-01 DIAGNOSIS — A419 Sepsis, unspecified organism: Secondary | ICD-10-CM | POA: Diagnosis not present

## 2021-08-01 LAB — CBC WITH DIFFERENTIAL/PLATELET
Abs Immature Granulocytes: 0.03 10*3/uL (ref 0.00–0.07)
Basophils Absolute: 0.1 10*3/uL (ref 0.0–0.1)
Basophils Relative: 0 %
Eosinophils Absolute: 0.1 10*3/uL (ref 0.0–0.5)
Eosinophils Relative: 1 %
HCT: 39.9 % (ref 36.0–46.0)
Hemoglobin: 12.8 g/dL (ref 12.0–15.0)
Immature Granulocytes: 0 %
Lymphocytes Relative: 15 %
Lymphs Abs: 1.9 10*3/uL (ref 0.7–4.0)
MCH: 27.7 pg (ref 26.0–34.0)
MCHC: 32.1 g/dL (ref 30.0–36.0)
MCV: 86.4 fL (ref 80.0–100.0)
Monocytes Absolute: 0.7 10*3/uL (ref 0.1–1.0)
Monocytes Relative: 5 %
Neutro Abs: 10.2 10*3/uL — ABNORMAL HIGH (ref 1.7–7.7)
Neutrophils Relative %: 79 %
Platelets: 314 10*3/uL (ref 150–400)
RBC: 4.62 MIL/uL (ref 3.87–5.11)
RDW: 15.6 % — ABNORMAL HIGH (ref 11.5–15.5)
WBC: 13.1 10*3/uL — ABNORMAL HIGH (ref 4.0–10.5)
nRBC: 0 % (ref 0.0–0.2)

## 2021-08-01 MED ORDER — OXYCODONE-ACETAMINOPHEN 5-325 MG PO TABS
2.0000 | ORAL_TABLET | Freq: Once | ORAL | Status: AC
  Administered 2021-08-01: 2 via ORAL
  Filled 2021-08-01: qty 2

## 2021-08-01 NOTE — ED Triage Notes (Signed)
Pt reports right breast pain, tenderness, and a warm sensation today since around lunch time. Currently breast feeding.

## 2021-08-02 DIAGNOSIS — N61 Mastitis without abscess: Secondary | ICD-10-CM | POA: Diagnosis not present

## 2021-08-02 DIAGNOSIS — N9489 Other specified conditions associated with female genital organs and menstrual cycle: Secondary | ICD-10-CM | POA: Diagnosis not present

## 2021-08-02 DIAGNOSIS — R509 Fever, unspecified: Secondary | ICD-10-CM | POA: Diagnosis not present

## 2021-08-02 LAB — URINALYSIS, ROUTINE W REFLEX MICROSCOPIC
Bilirubin Urine: NEGATIVE
Glucose, UA: NEGATIVE mg/dL
Hgb urine dipstick: NEGATIVE
Ketones, ur: NEGATIVE mg/dL
Nitrite: POSITIVE — AB
Protein, ur: NEGATIVE mg/dL
Specific Gravity, Urine: 1.02 (ref 1.005–1.030)
pH: 7 (ref 5.0–8.0)

## 2021-08-02 LAB — LACTIC ACID, PLASMA: Lactic Acid, Venous: 1.3 mmol/L (ref 0.5–1.9)

## 2021-08-02 LAB — COMPREHENSIVE METABOLIC PANEL
ALT: 30 U/L (ref 0–44)
AST: 23 U/L (ref 15–41)
Albumin: 4.5 g/dL (ref 3.5–5.0)
Alkaline Phosphatase: 95 U/L (ref 38–126)
Anion gap: 6 (ref 5–15)
BUN: 16 mg/dL (ref 6–20)
CO2: 26 mmol/L (ref 22–32)
Calcium: 9.6 mg/dL (ref 8.9–10.3)
Chloride: 109 mmol/L (ref 98–111)
Creatinine, Ser: 0.77 mg/dL (ref 0.44–1.00)
GFR, Estimated: >60 mL/min (ref >60)
Glucose, Bld: 115 mg/dL — ABNORMAL HIGH (ref 70–99)
Potassium: 4 mmol/L (ref 3.5–5.1)
Sodium: 141 mmol/L (ref 135–145)
Total Bilirubin: 0.5 mg/dL (ref 0.3–1.2)
Total Protein: 7.8 g/dL (ref 6.5–8.1)

## 2021-08-02 LAB — HCG, QUANTITATIVE, PREGNANCY: hCG, Beta Chain, Quant, S: <1 m[IU]/mL (ref <5)

## 2021-08-02 LAB — PROTIME-INR
INR: 1 (ref 0.8–1.2)
Prothrombin Time: 12.7 seconds (ref 11.4–15.2)

## 2021-08-02 LAB — APTT: aPTT: 28 seconds (ref 24–36)

## 2021-08-02 MED ORDER — OXYCODONE HCL 5 MG PO TABS: 5.0000 mg | ORAL_TABLET | Freq: Four times a day (QID) | ORAL | 0 refills | Status: DC | PRN

## 2021-08-02 MED ORDER — SODIUM CHLORIDE 0.9 % IV BOLUS
1000.0000 mL | Freq: Once | INTRAVENOUS | Status: AC
  Administered 2021-08-02: 1000 mL via INTRAVENOUS

## 2021-08-02 MED ORDER — SULFAMETHOXAZOLE-TRIMETHOPRIM 800-160 MG PO TABS: 1.0000 | ORAL_TABLET | Freq: Two times a day (BID) | ORAL | 0 refills | Status: AC

## 2021-08-02 MED ORDER — SULFAMETHOXAZOLE-TRIMETHOPRIM 800-160 MG PO TABS
1.0000 | ORAL_TABLET | Freq: Once | ORAL | Status: AC
  Administered 2021-08-02: 1 via ORAL
  Filled 2021-08-02: qty 1

## 2021-08-02 NOTE — Discharge Instructions (Addendum)
1. Medications: Bactrim, tylenol/ibuprofen for pain management; oxycodone for breakthrough pain, usual home medications 2. Treatment: rest, drink plenty of fluids,  3. Follow Up: Please followup with your women's clinic in 2-3 days for discussion of your diagnoses and further evaluation after today's visit; if you do not have a primary care doctor use the resource guide provided to find one; Please return to the ER for new or worsening symptoms

## 2021-08-02 NOTE — ED Provider Notes (Signed)
[facility]-EMERGENCY DEPT Provider Note   CSN: [id number] Arrival date & time: 08/01/21  2305     History Chief Complaint  Patient presents with   Breast Pain    Peggy Mckenzie is a 30 y.o. female presents emergency department with concerns of mastitis of the right breast.  She reports she is currently nursing.  Records reviewed.  Patient delivered on 05/11/2021.  Reports this morning she noticed some tenderness.  Patient reports she used warm compresses and tried massaging her breast without significant improvement.  Reports that this evening she began to feel poorly and noticed that she had a fever greater than 101.  She took Tylenol around 8 PM.  She reports associated body aches and generally feeling unwell but denies nausea, vomiting or diarrhea.  Denies changes to her nipple or drainage from her breast.  Reports she has also been pumping without improvement.  Denies purulent drainage.  The history is provided by the patient and medical records. No language interpreter was used.      Past Medical History:  Diagnosis Date   Bipolar 1 disorder (HCC)    Compartment syndrome (HCC) 2006   Left leg   Depression    Fracture 2006   Left  leg requiring rods and screws   Headache    Hemorrhage    post partum hemorrhage   Infection    frequent UTI's   Pyelonephritis during pregnancy 2021    Patient Active Problem List   Diagnosis Date Noted   Bipolar depression (HCC) 05/11/2021   HSV infection 05/11/2021   Preeclampsia, severe, third trimester 05/11/2021   Gestational hypertension 05/08/2021    Past Surgical History:  Procedure Laterality Date   CYST REMOVAL HAND     FRACTURE SURGERY Left    leg     OB History     Gravida  5   Para  4   Term  3   Preterm  1   AB  1   Living  4      SAB  1   IAB      Ectopic      Multiple  0   Live Births  4           Family History  Problem Relation Age of Onset   Birth defects  Paternal Aunt        spina bifida   Hypertension Paternal Grandmother     Social History   Tobacco Use   Smoking status: Never   Smokeless tobacco: Never  Substance Use Topics   Alcohol use: No    Comment: occasional,, not now   Drug use: No    Home Medications Prior to Admission medications   Medication Sig Start Date End Date Taking? Authorizing Provider  oxyCODONE (ROXICODONE) 5 MG immediate release tablet Take 1 tablet (5 mg total) by mouth every 6 (six) hours as needed for severe pain. 08/02/21  Yes Marlenne Ridge, Dahlia Client, PA-C  sulfamethoxazole-trimethoprim (BACTRIM DS) 800-160 MG tablet Take 1 tablet by mouth 2 (two) times daily for 7 days. 08/02/21 08/09/21 Yes Aeric Burnham, Dahlia Client, PA-C  acetaminophen (TYLENOL) 325 MG tablet Take 2 tablets (650 mg total) by mouth every 4 (four) hours as needed (for pain scale < 4). 05/15/21   Holshouser, Gerhard Munch, CNM  ferrous sulfate 325 (65 FE) MG tablet Take 1 tablet (325 mg total) by mouth every other day. 05/17/21   Holshouser, Gerhard Munch, CNM  magnesium oxide (MAG-OX) 400 (240 Mg) MG  tablet Take 1 tablet (400 mg total) by mouth daily. 05/10/21   Holshouser, Gerhard Munch, CNM  NIFEdipine (ADALAT CC) 30 MG 24 hr tablet Take 1 tablet (30 mg total) by mouth daily. 05/16/21   Holshouser, Gerhard Munch, CNM  Prenatal Vit-Fe Fumarate-FA (PRENATAL MULTIVITAMIN) TABS tablet Take 1 tablet by mouth daily at 12 noon.    [provider]    Allergies    Patient has no known allergies.  Review of Systems   Review of Systems  Constitutional:  Positive for chills, fatigue and fever. Negative for appetite change, diaphoresis and unexpected weight change.  HENT:  Negative for mouth sores.   Eyes:  Negative for visual disturbance.  Respiratory:  Negative for cough, chest tightness, shortness of breath and wheezing.   Cardiovascular:  Negative for chest pain.  Gastrointestinal:  Negative for abdominal pain, constipation, diarrhea, nausea and vomiting.   Endocrine: Negative for polydipsia, polyphagia and polyuria.  Genitourinary:  Negative for dysuria, frequency, hematuria and urgency.  Musculoskeletal:  Negative for back pain and neck stiffness.  Skin:  Positive for color change. Negative for rash.  Allergic/Immunologic: Negative for immunocompromised state.  Neurological:  Negative for syncope, light-headedness and headaches.  Hematological:  Does not bruise/bleed easily.  Psychiatric/Behavioral:  Negative for sleep disturbance. The patient is not nervous/anxious.    Physical Exam Updated Vital Signs BP 140/86   Pulse 93   Temp 99.7 F (37.6 C) (Oral) Comment: 1G tylenol @ 20:00  Resp 13   Ht 5\' 3"  (1.6 m)   Wt 93 kg   LMP 09/01/2020   SpO2 98%   Breastfeeding Yes   BMI 36.31 kg/m   Physical Exam Vitals and nursing note reviewed.  Constitutional:      General: She is not in acute distress.    Appearance: She is not diaphoretic.  HENT:     Head: Normocephalic.  Eyes:     General: No scleral icterus.    Conjunctiva/sclera: Conjunctivae normal.  Cardiovascular:     Rate and Rhythm: Normal rate and regular rhythm.     Pulses: Normal pulses.          Radial pulses are 2+ on the right side and 2+ on the left side.  Pulmonary:     Effort: No tachypnea, accessory muscle usage, prolonged expiration, respiratory distress or retractions.     Breath sounds: Normal breath sounds. No stridor.     Comments: Equal chest rise. No increased work of breathing. Chest:  Breasts:    Right: Swelling, mass and tenderness present. No nipple discharge or skin change.     Left: No swelling, mass, skin change or tenderness.    Abdominal:     General: There is no distension.     Palpations: Abdomen is soft.     Tenderness: There is no abdominal tenderness. There is no guarding or rebound.  Musculoskeletal:     Cervical back: Normal range of motion.     Comments: Moves all extremities equally and without difficulty.  Skin:    General:  Skin is warm and dry.     Capillary Refill: Capillary refill takes less than 2 seconds.  Neurological:     Mental Status: She is alert.     GCS: GCS eye subscore is 4. GCS verbal subscore is 5. GCS motor subscore is 6.     Comments: Speech is clear and goal oriented.  Psychiatric:        Mood and Affect: Mood normal.  ED Results / Procedures / Treatments   Labs (all labs ordered are listed, but only abnormal results are displayed) Labs Reviewed  COMPREHENSIVE METABOLIC PANEL - Abnormal; Notable for the following components:      Result Value   Glucose, Bld 115 (*)    All other components within normal limits  CBC WITH DIFFERENTIAL/PLATELET - Abnormal; Notable for the following components:   WBC 13.1 (*)    RDW 15.6 (*)    Neutro Abs 10.2 (*)    All other components within normal limits  URINALYSIS, ROUTINE W REFLEX MICROSCOPIC - Abnormal; Notable for the following components:   Color, Urine YELLOW (*)    APPearance CLEAR (*)    Nitrite POSITIVE (*)    Leukocytes,Ua TRACE (*)    Bacteria, UA MANY (*)    All other components within normal limits  CULTURE, BLOOD (SINGLE)  URINE CULTURE  LACTIC ACID, PLASMA  PROTIME-INR  APTT  HCG, QUANTITATIVE, PREGNANCY     Radiology DG Chest Port 1 View  Result Date: 08/01/2021 CLINICAL DATA:  Questionable sepsis - evaluate for abnormality Patient reports right breast pain, tenderness, and warmth. Currently breast feeding. EXAM: PORTABLE CHEST 1 VIEW COMPARISON:  None. FINDINGS: The cardiomediastinal contours are normal. The lungs are clear. Pulmonary vasculature is normal. No consolidation, pleural effusion, or pneumothorax. No acute osseous abnormalities are seen. IMPRESSION: Negative portable AP view of the chest. Electronically Signed   By: Narda Rutherford M.D.   On: 08/01/2021 23:37    Procedures Procedures   Medications Ordered in ED Medications  oxyCODONE-acetaminophen (PERCOCET/ROXICET) 5-325 MG per tablet 2 tablet (2  tablets Oral Given 08/01/21 2349)  sulfamethoxazole-trimethoprim (BACTRIM DS) 800-160 MG per tablet 1 tablet (1 tablet Oral Given 08/02/21 0047)  sodium chloride 0.9 % bolus 1,000 mL (0 mLs Intravenous Stopped 08/02/21 0129)    ED Course  I have reviewed the triage vital signs and the nursing notes.  Pertinent labs & imaging results that were available during my care of the patient were reviewed by me and considered in my medical decision making (see chart for details).  Clinical Course as of 08/02/21 0153  Caleen Essex Aug 01, 2021  2320 Delivery 05/11/2021 [HM]    Clinical Course User Index [HM] Lequan Dobratz, Boyd Kerbs   MDM Rules/Calculators/A&P                           Patient presents with mastitis.  Low-grade fever on arrival.  Concern for possible sepsis.  Labs pending.  12:45 AM Labs with leukocytosis.  Lactic acid within normal limits.  Patient given fluids.  Will give Bactrim and reassess.  1:53 AM Additional labs reassuring.  Patient with urinary tract infection.  Urine culture sent.  Bactrim will cover both.  Discussed follow-up with OB/GYN and reasons to return to the emergency department.  Patient states understanding and is in agreement with the plan.  Final Clinical Impression(s) / ED Diagnoses Final diagnoses:  Mastitis    Rx / DC Orders ED Discharge Orders          Ordered    sulfamethoxazole-trimethoprim (BACTRIM DS) 800-160 MG tablet  2 times daily        08/02/21 0148    oxyCODONE (ROXICODONE) 5 MG immediate release tablet  Every 6 hours PRN        08/02/21 0148             Jaymari Cromie, Dahlia Client, PA-C 08/02/21 0153    Madilyn Hook,  Lanora Manis, MD 08/02/21 [phone]

## 2021-08-04 LAB — URINE CULTURE: Culture: >=100000 — AB

## 2021-08-05 ENCOUNTER — Telehealth: Payer: Self-pay

## 2021-08-05 NOTE — Telephone Encounter (Signed)
Post ED Visit - Positive Culture [name]: Successful Patient [name]  Culture assessed and recommendations reviewed by:  []  , Pharm.D. []  Enzo Bi, .D., BCPS AQ-ID []  Celedonio Miyamoto, Pharm.D., BCPS []  [address], Pharm.D., BCPS []  [place], Garvin Fila.D., BCPS, AAHIVP []  , Pharm.D., BCPS, AAHIVP []  Georgina Pillion, PharmD, BCPS []  , PharmD, BCPS []  [place], PharmD, BCPS []  [place], PharmD  Positive urine culture  []  Patient discharged without antimicrobial prescription and treatment is now indicated [x]  Organism is resistant to prescribed ED discharge antimicrobial []  Patient with positive blood cultures  Changes discussed with ED provider: , PA  Instructions: speak with patient if feeling better continue Bactrim if not feeling better stop Bactrim and start Nitrofurantoin 100 mg pr BID x 7 days.   Spoke with pt, she states her mastitis is improving, but urine has a foul odor. Spoke with ED provider Estella Husk, PA relaying patients symptoms. New instructions to complete the Bactrim and follow up with her primary care provider if urinary symptoms worsen.   Spoke with patient and relayed new instructions, patient states she understands.   Contacted patient, date 08/05/2021, time 2:10pm   Lysle Pearl 08/05/2021, 2:21 PM

## 2021-08-05 NOTE — Progress Notes (Signed)
ED Antimicrobial Stewardship Positive Culture Follow Up   [name] is an 30 y.o. female who presented to [facility] on 08/01/2021 with a chief complaint of fever, right breast pain and tenderness. Chief Complaint  Patient presents with   Breast Pain    Recent Results (from the past 720 hour(s))  Blood culture (routine single)     Status: None (Preliminary result)   Collection Time: 08/01/21 11:20 PM   Specimen: BLOOD  Result Value Ref Range Status   Specimen Description   Final    BLOOD LEFT ANTECUBITAL Performed at [facility], [address], [place], [place] [address]    Special Requests   Final    BOTTLES DRAWN AEROBIC AND ANAEROBIC Blood Culture adequate volume Performed at [facility], [address] [place], [place] [address]    Culture   Final    NO GROWTH 2 DAYS Performed at [facility], [address], [place], [place] [address]    Report Status PENDING  Incomplete  Urine Culture     Status: Abnormal   Collection Time: 08/01/21 11:50 PM   Specimen: Urine, Clean Catch  Result Value Ref Range Status   Specimen Description   Final    URINE, CLEAN CATCH Performed at [facility], [address], [place], [place] [address]    Special Requests   Final    NONE Performed at [facility], [address] [place], [place] [address]    Culture (A)  Final    >=100,000 COLONIES/mL ESCHERICHIA COLI Confirmed Extended Spectrum Beta-Lactamase Producer (ESBL).  In bloodstream infections from ESBL organisms, carbapenems are preferred over piperacillin/tazobactam. They are shown to have a lower risk of mortality.    Report Status 08/04/2021 FINAL  Final   Organism ID, Bacteria ESCHERICHIA COLI (A)  Final      Susceptibility   Escherichia coli - MIC*    AMPICILLIN >=32 RESISTANT Resistant     CEFAZOLIN >=64 RESISTANT Resistant     CEFEPIME 2 SENSITIVE Sensitive     CEFTRIAXONE >=64  RESISTANT Resistant     CIPROFLOXACIN 0.5 SENSITIVE Sensitive     GENTAMICIN <=1 SENSITIVE Sensitive     IMIPENEM <=0.25 SENSITIVE Sensitive     NITROFURANTOIN <=16 SENSITIVE Sensitive     TRIMETH/SULFA >=320 RESISTANT Resistant     AMPICILLIN/SULBACTAM 4 SENSITIVE Sensitive     PIP/TAZO <=4 SENSITIVE Sensitive     * >=100,000 COLONIES/mL ESCHERICHIA COLI   30 YO F, currently breastfeeding, reported to the ED with complaints of fever, body aches, right breast pain, tenderness and warmth. She reported using warm compresses, massaging the breast and pumping with no significant improvement. Patient denied any nausea, vomiting, diarrhea, nipple changes/drainage or urinary symptoms. During ED visit, UA was obtained that showed positive nitrites, trace leukocytes, many bacteria and 6-10 WBC and her UCx grew > 100,000 colonies of ESBL E.coli. Patient was discharged on Bactrim DS 1 tablet PO BID x 7 days which was resistant.  Plan:  - Call and perform UTI symptom check.    ** If patient feeling better: Continue Bactrim until finished    ** If patient NOT feeling better: STOP Bactrim, and START Macrobid 100 mg BID x 7 days  ED Provider: Theron Arista, PA  Johnston Ebbs, Student Pharmacist

## 2021-08-07 LAB — CULTURE, BLOOD (SINGLE)
Culture: NO GROWTH
Special Requests: ADEQUATE

## 2021-12-04 DIAGNOSIS — O906 Postpartum mood disturbance: Secondary | ICD-10-CM | POA: Diagnosis not present

## 2021-12-04 DIAGNOSIS — F419 Anxiety disorder, unspecified: Secondary | ICD-10-CM | POA: Diagnosis not present

## 2021-12-04 DIAGNOSIS — Z713 Dietary counseling and surveillance: Secondary | ICD-10-CM | POA: Diagnosis not present

## 2021-12-04 DIAGNOSIS — Z8759 Personal history of other complications of pregnancy, childbirth and the puerperium: Secondary | ICD-10-CM | POA: Diagnosis not present

## 2021-12-04 DIAGNOSIS — E669 Obesity, unspecified: Secondary | ICD-10-CM | POA: Diagnosis not present

## 2022-01-14 DIAGNOSIS — Z0000 Encounter for general adult medical examination without abnormal findings: Secondary | ICD-10-CM | POA: Diagnosis not present

## 2022-01-22 DIAGNOSIS — Z0000 Encounter for general adult medical examination without abnormal findings: Secondary | ICD-10-CM | POA: Diagnosis not present

## 2022-01-22 DIAGNOSIS — O906 Postpartum mood disturbance: Secondary | ICD-10-CM | POA: Diagnosis not present

## 2022-01-22 DIAGNOSIS — E669 Obesity, unspecified: Secondary | ICD-10-CM | POA: Diagnosis not present

## 2022-01-22 DIAGNOSIS — F988 Other specified behavioral and emotional disorders with onset usually occurring in childhood and adolescence: Secondary | ICD-10-CM | POA: Diagnosis not present

## 2022-01-22 DIAGNOSIS — Z1331 Encounter for screening for depression: Secondary | ICD-10-CM | POA: Diagnosis not present

## 2022-01-22 DIAGNOSIS — F419 Anxiety disorder, unspecified: Secondary | ICD-10-CM | POA: Diagnosis not present

## 2022-01-22 DIAGNOSIS — Z1339 Encounter for screening examination for other mental health and behavioral disorders: Secondary | ICD-10-CM | POA: Diagnosis not present

## 2022-01-22 DIAGNOSIS — Z8759 Personal history of other complications of pregnancy, childbirth and the puerperium: Secondary | ICD-10-CM | POA: Diagnosis not present

## 2022-01-29 ENCOUNTER — Other Ambulatory Visit: Payer: Self-pay

## 2022-01-29 MED ORDER — VYVANSE 20 MG PO CAPS
ORAL_CAPSULE | ORAL | 0 refills | Status: DC
  Filled 2022-01-29 – 2022-02-03 (×2): qty 30, 30d supply, fill #0

## 2022-02-03 ENCOUNTER — Other Ambulatory Visit: Payer: Self-pay

## 2022-02-05 ENCOUNTER — Other Ambulatory Visit: Payer: Self-pay

## 2022-02-05 DIAGNOSIS — Z713 Dietary counseling and surveillance: Secondary | ICD-10-CM | POA: Diagnosis not present

## 2022-02-05 DIAGNOSIS — Z6837 Body mass index (BMI) 37.0-37.9, adult: Secondary | ICD-10-CM | POA: Diagnosis not present

## 2022-02-05 MED ORDER — WEGOVY 0.5 MG/0.5ML ~~LOC~~ SOAJ
SUBCUTANEOUS | 0 refills | Status: DC
  Filled 2022-02-05 – 2022-02-20 (×3): qty 2, 28d supply, fill #0

## 2022-02-06 ENCOUNTER — Other Ambulatory Visit: Payer: Self-pay

## 2022-02-09 ENCOUNTER — Other Ambulatory Visit: Payer: Self-pay

## 2022-02-19 ENCOUNTER — Other Ambulatory Visit: Payer: Self-pay

## 2022-02-20 ENCOUNTER — Other Ambulatory Visit: Payer: Self-pay

## 2022-02-26 ENCOUNTER — Other Ambulatory Visit: Payer: Self-pay

## 2022-02-26 DIAGNOSIS — Z0189 Encounter for other specified special examinations: Secondary | ICD-10-CM | POA: Diagnosis not present

## 2022-02-26 DIAGNOSIS — F419 Anxiety disorder, unspecified: Secondary | ICD-10-CM | POA: Diagnosis not present

## 2022-02-26 DIAGNOSIS — O906 Postpartum mood disturbance: Secondary | ICD-10-CM | POA: Diagnosis not present

## 2022-02-26 DIAGNOSIS — R8290 Unspecified abnormal findings in urine: Secondary | ICD-10-CM | POA: Diagnosis not present

## 2022-02-26 DIAGNOSIS — F988 Other specified behavioral and emotional disorders with onset usually occurring in childhood and adolescence: Secondary | ICD-10-CM | POA: Diagnosis not present

## 2022-02-26 MED ORDER — SERTRALINE HCL 100 MG PO TABS
ORAL_TABLET | ORAL | 3 refills | Status: DC
  Filled 2022-02-26: qty 90, 90d supply, fill #0

## 2022-02-26 MED ORDER — AMPHETAMINE-DEXTROAMPHETAMINE 10 MG PO TABS
ORAL_TABLET | ORAL | 0 refills | Status: DC
  Filled 2022-02-26: qty 60, 30d supply, fill #0

## 2022-03-03 ENCOUNTER — Other Ambulatory Visit: Payer: Self-pay

## 2022-03-04 ENCOUNTER — Other Ambulatory Visit: Payer: Self-pay

## 2022-03-04 MED ORDER — NITROFURANTOIN MONOHYD MACRO 100 MG PO CAPS
ORAL_CAPSULE | ORAL | 0 refills | Status: DC
  Filled 2022-03-04: qty 10, 5d supply, fill #0

## 2022-03-11 DIAGNOSIS — N200 Calculus of kidney: Secondary | ICD-10-CM | POA: Diagnosis not present

## 2022-04-12 IMAGING — US US OB < 14 WEEKS - US OB TV
1 series · 15 of 28 positions shown · non-contrast
Comparison: CT 07/16/2020

CLINICAL DATA: Pelvic cramping, left lower quadrant, back,
quantitative hCG pending

EXAM:
OBSTETRIC <14 WK US AND TRANSVAGINAL OB US
TECHNIQUE: Both transabdominal and transvaginal ultrasound examinations were
performed for complete evaluation of the gestation as well as the
maternal uterus, adnexal regions, and pelvic cul-de-sac.
Transvaginal technique was performed to assess early pregnancy.

[Series 1: us ob < 14 weeks - us ob tv · 41 acquisitions, 15 frames shown]
[im 1/41]
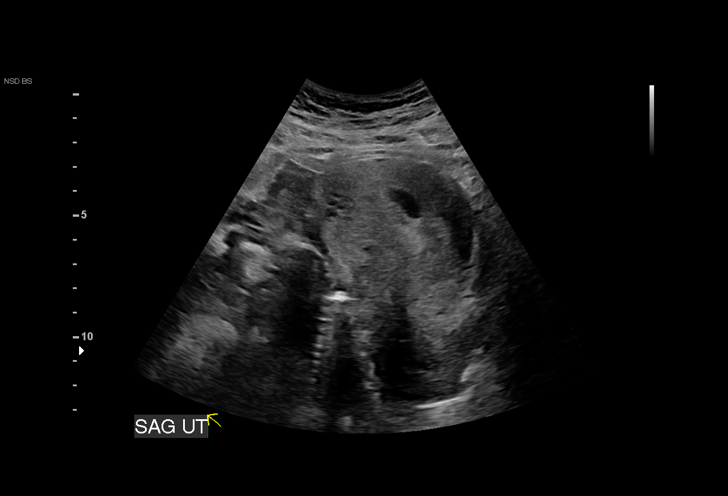
[im 3/41]
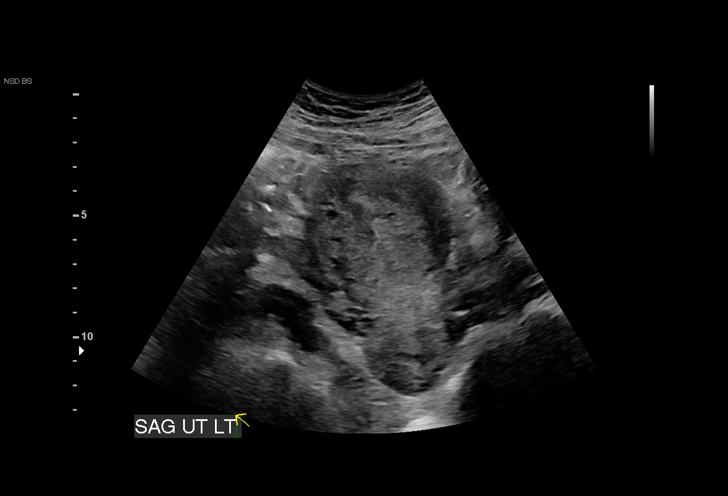
[im 6/41]
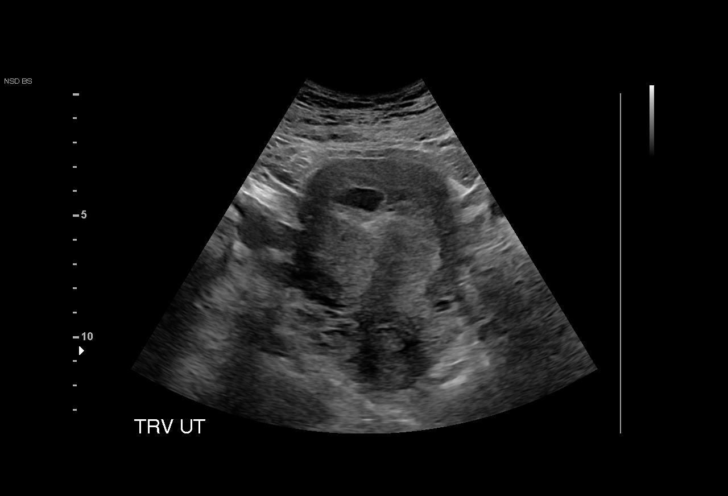
[im 9/41]
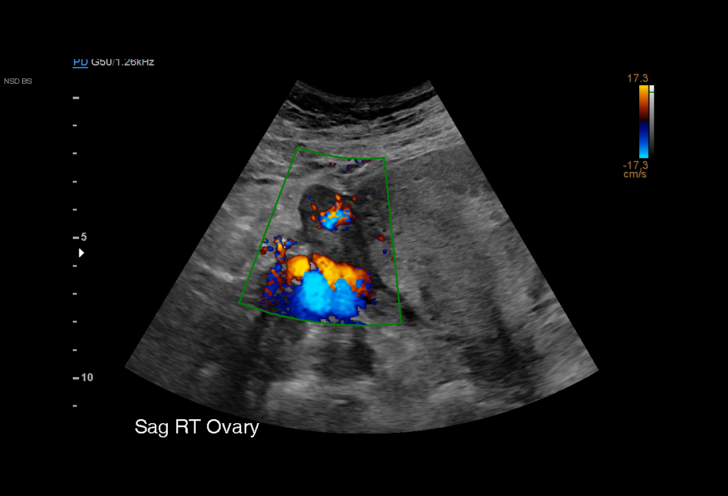
[im 12/41]
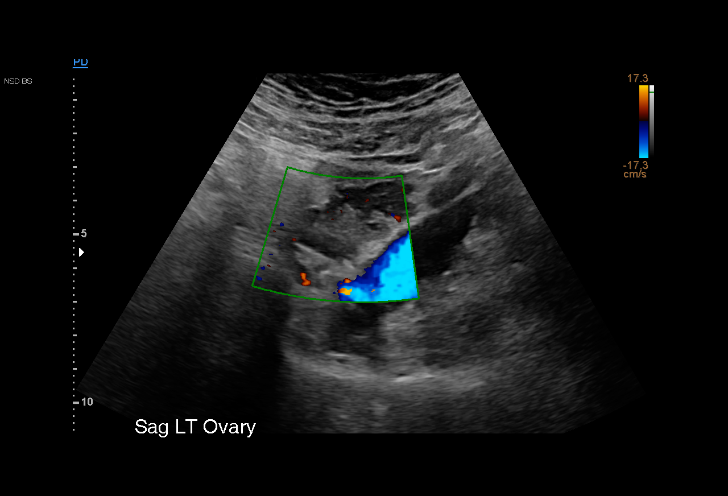
[im 15/41]
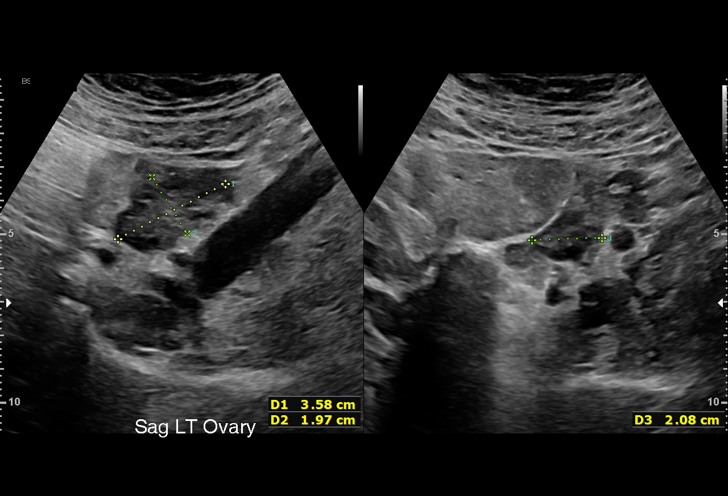
[im 18/41]
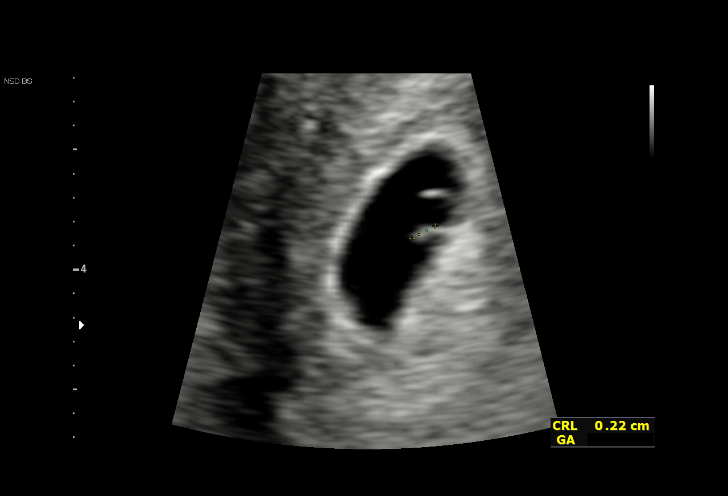
[im 21/41]
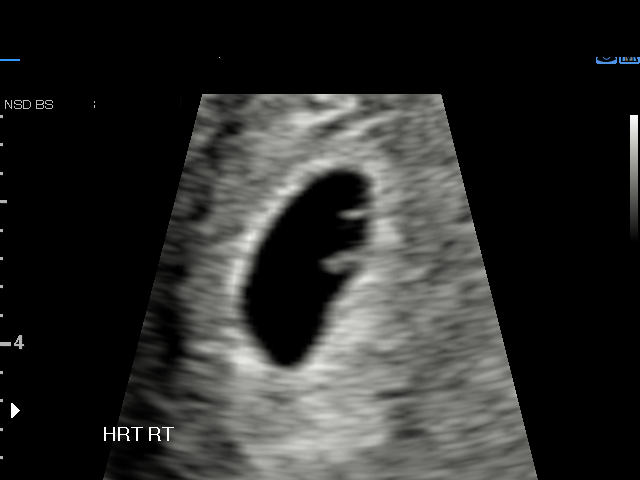
[im 23/41]
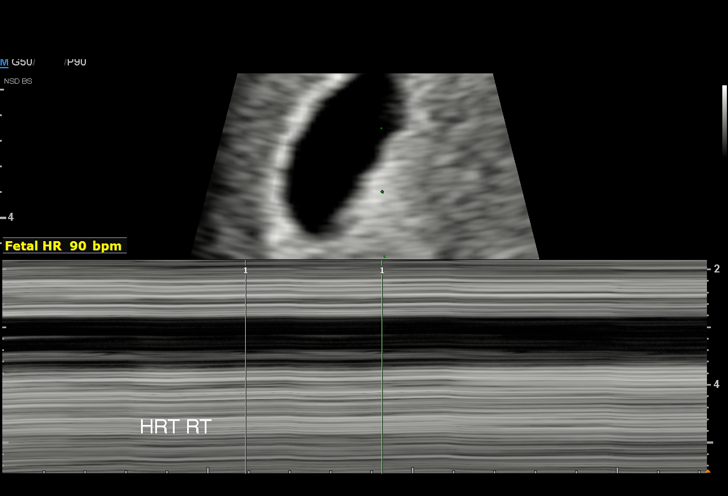
[im 26/41]
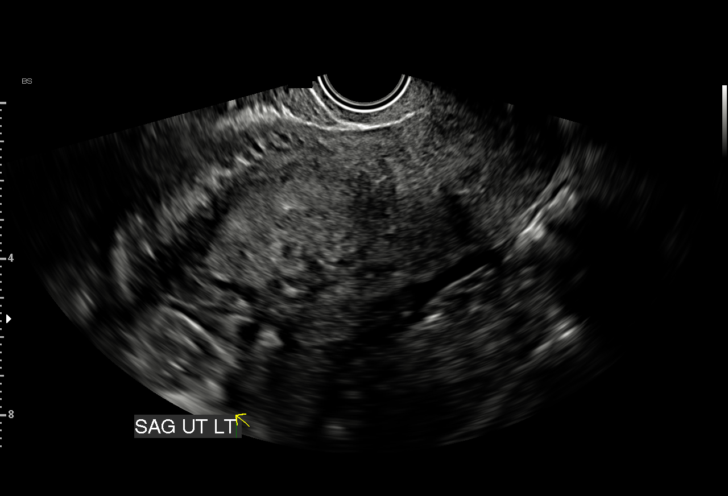
[im 29/41]
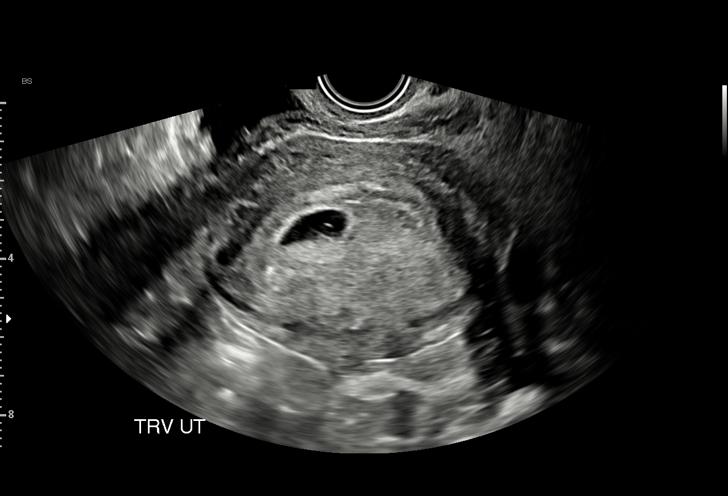
[im 32/41]
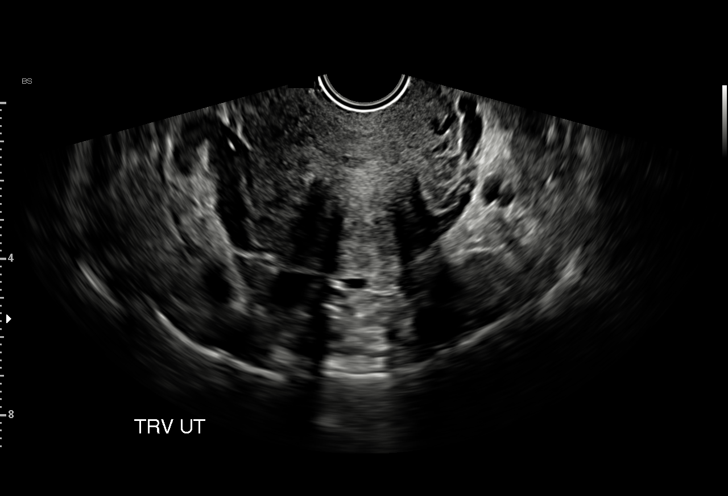
[im 35/41]
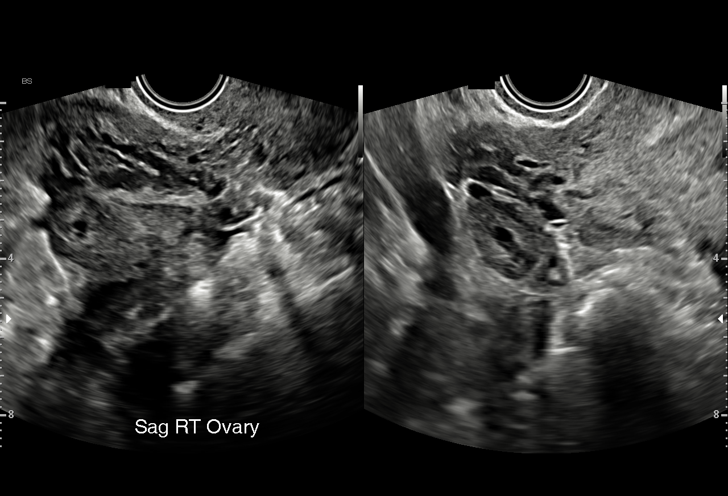
[im 38/41]
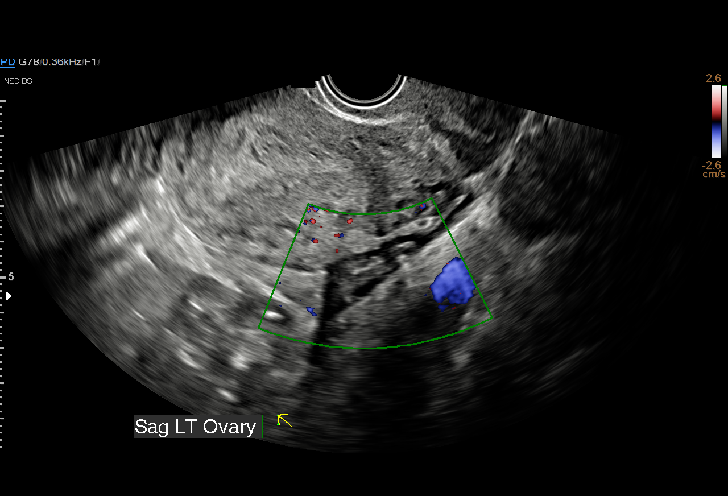
[im 41/41]
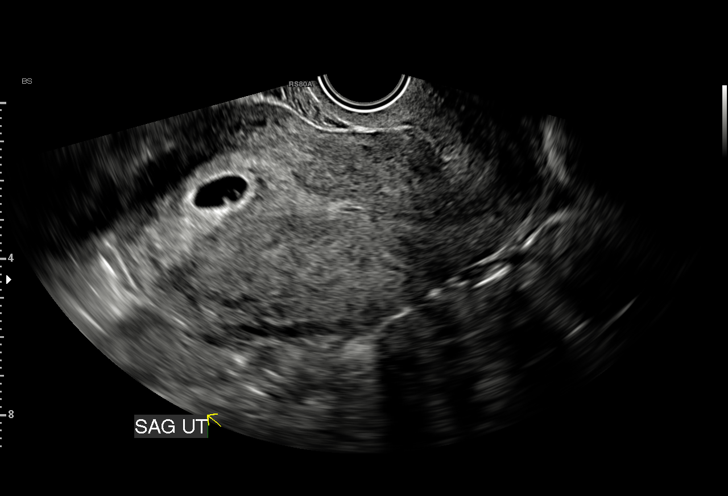

[15 of 28 positions shown; findings below may reference images not displayed]

FINDINGS: Intrauterine gestational sac: Single

Yolk sac:  Visualized.

Embryo:  Visualized.

Cardiac Activity: Visualized.

Heart Rate: 90 bpm

CRL:  2.4 mm   5 w   5 d                  US EDC: 06/09/2021

Subchorionic hemorrhage:  None visualized.

Maternal uterus/adnexae: Normal anteverted maternal uterus. No
concerning adnexal lesions. No free fluid.
IMPRESSION: Single intrauterine gestation of 5 weeks, 5 days by crown-rump
length measurement.

Heart rate of 90 beats per minute can be related to early gestation,
typically accelerating over the first several weeks following
detection. If there is clinical concern, consider short-term
follow-up to ensure continued viability.

## 2022-04-21 IMAGING — US US OB TRANSVAGINAL
1 series · 15 of 28 positions shown · non-contrast
Comparison: 10/12/2020

CLINICAL DATA: Pregnancy.  Assess viability

EXAM:
TRANSVAGINAL OB ULTRASOUND
TECHNIQUE: Transvaginal ultrasound was performed for complete evaluation of the
gestation as well as the maternal uterus, adnexal regions, and
pelvic cul-de-sac.

[Series 1: us ob transvaginal · 15 of 49 slices shown]
[im 1/49]
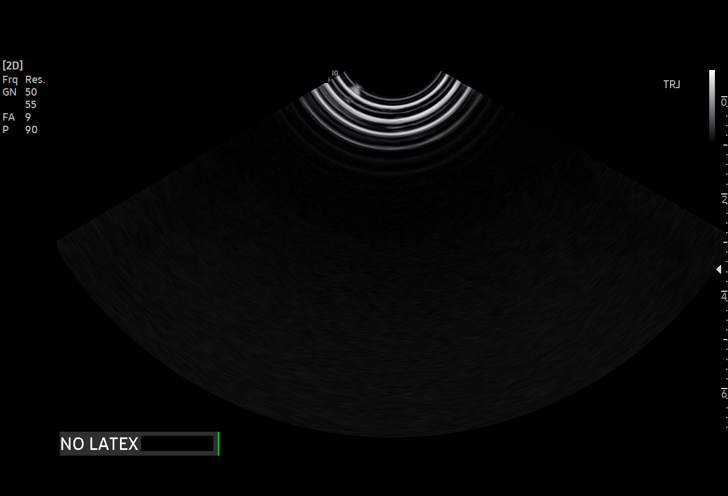
[im 4/49]
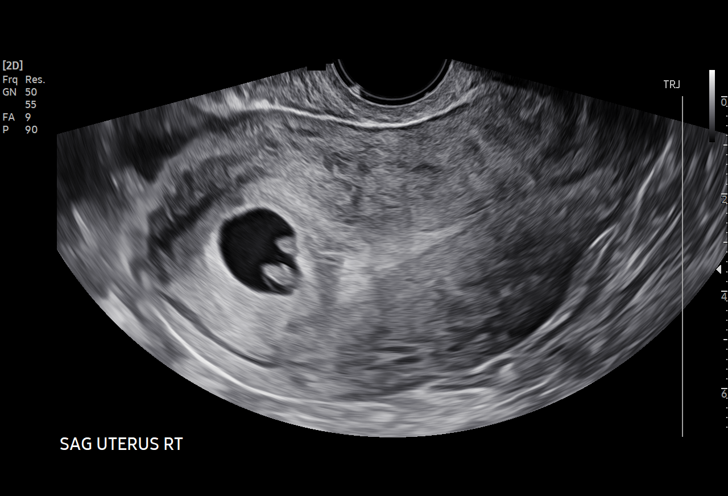
[im 8/49]
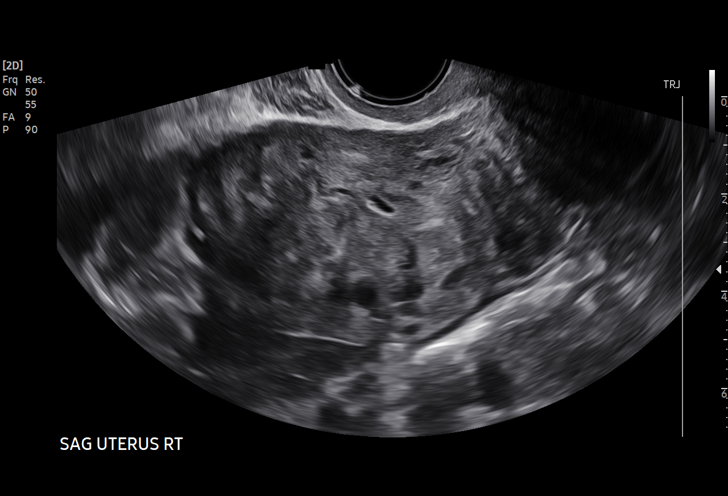
[im 11/49]
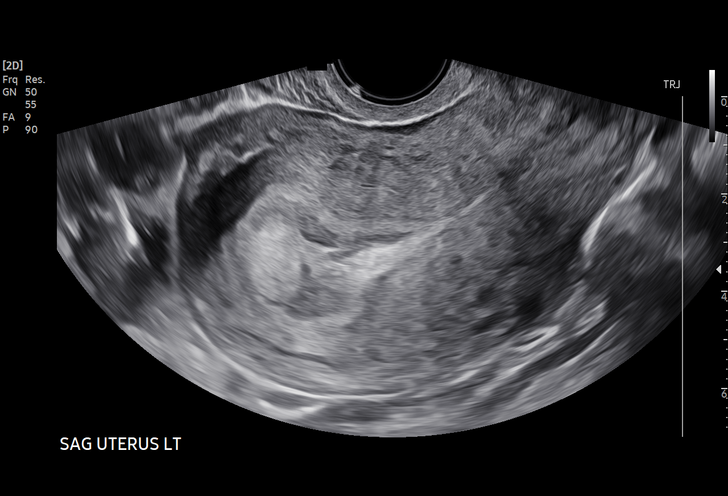
[im 15/49]
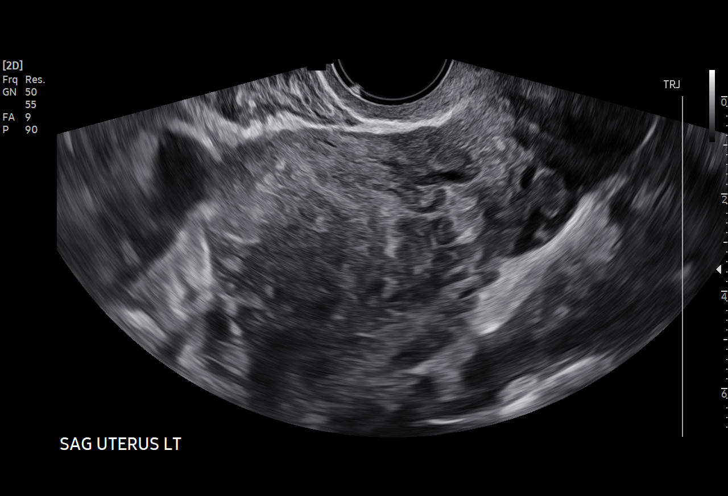
[im 18/49]
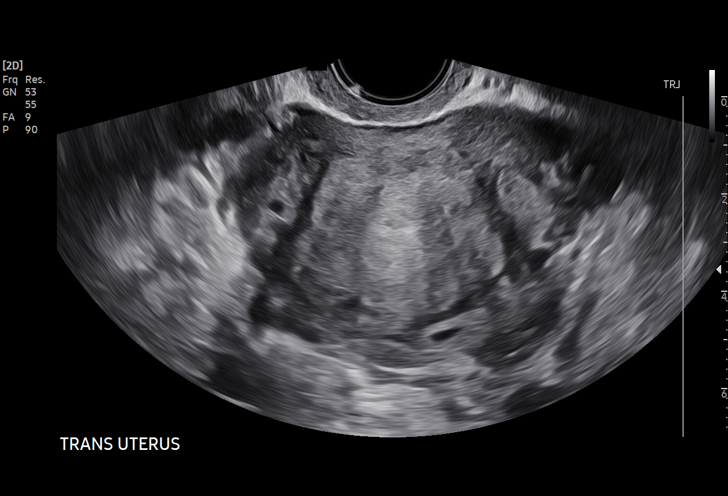
[im 22/49]
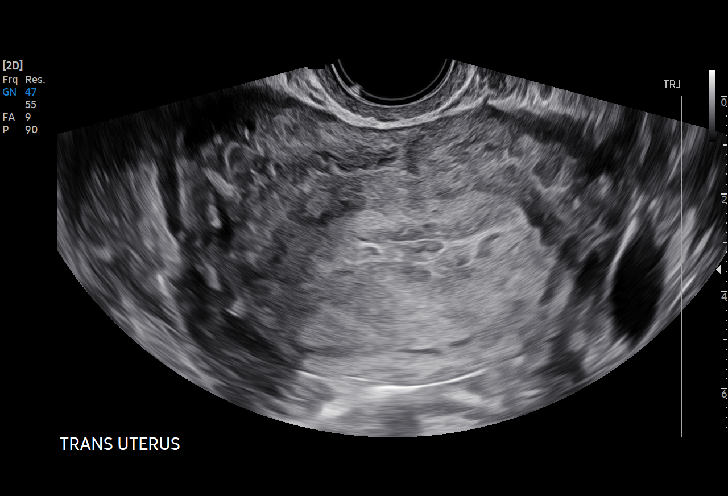
[im 25/49]
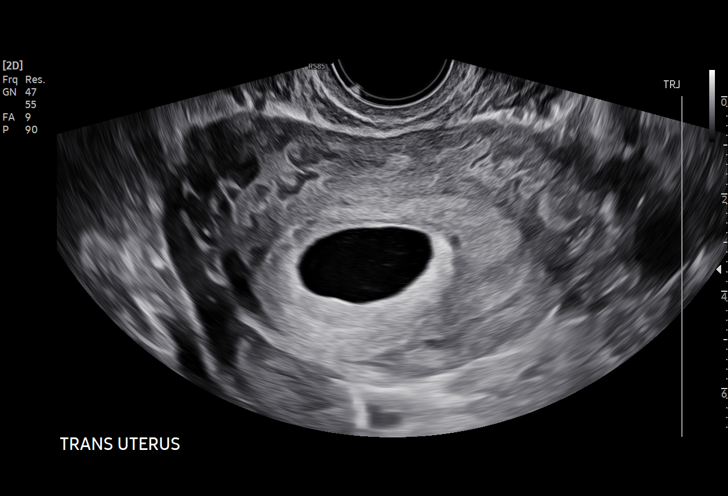
[im 27/49]
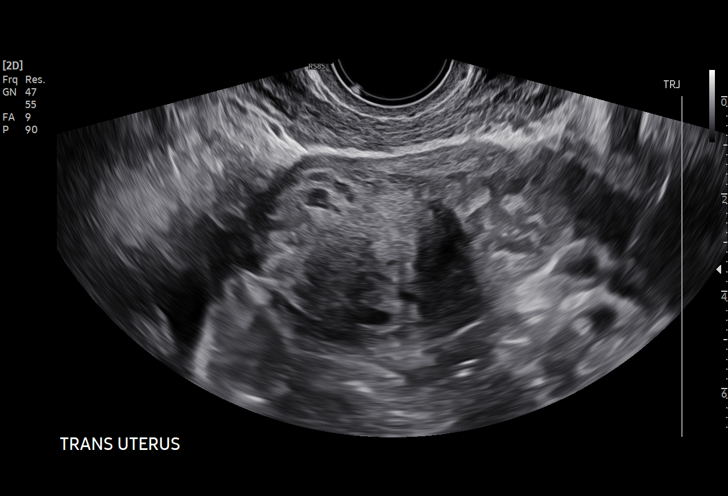
[im 31/49]
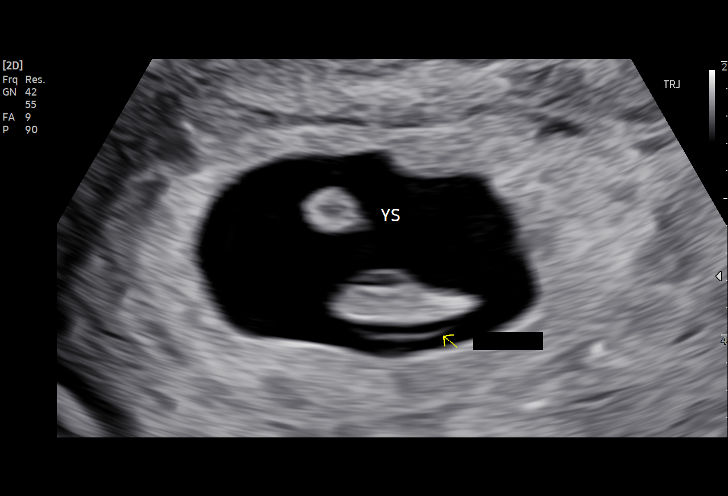
[im 34/49]
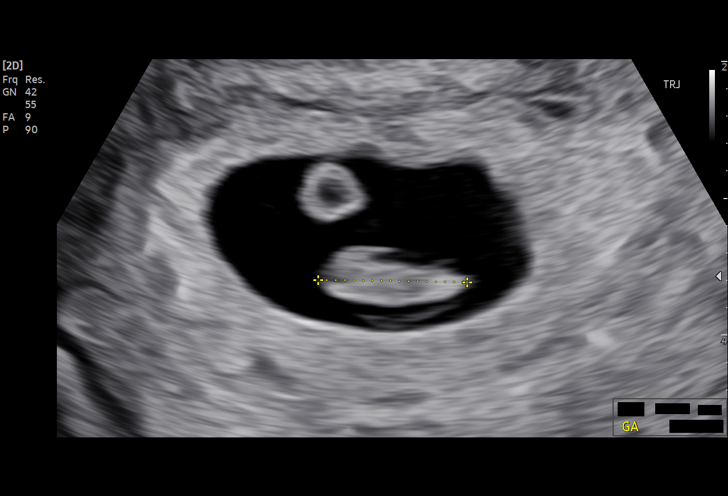
[im 38/49]
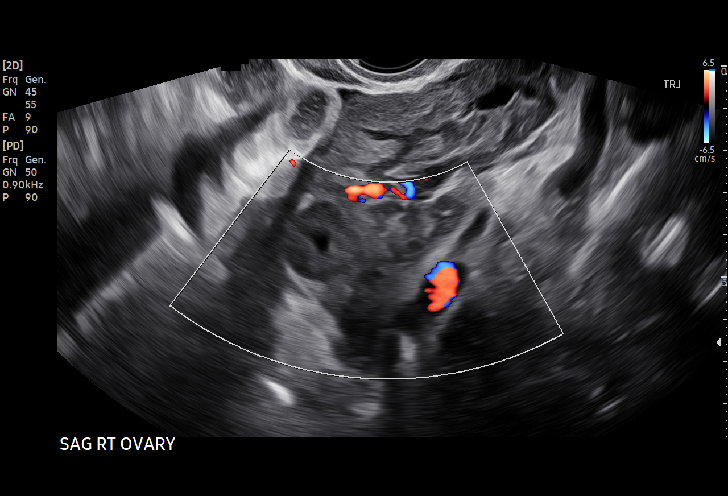
[im 41/49]
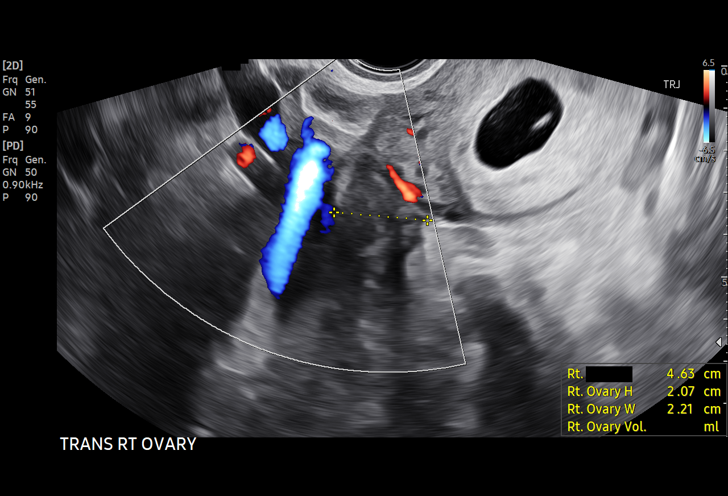
[im 45/49]
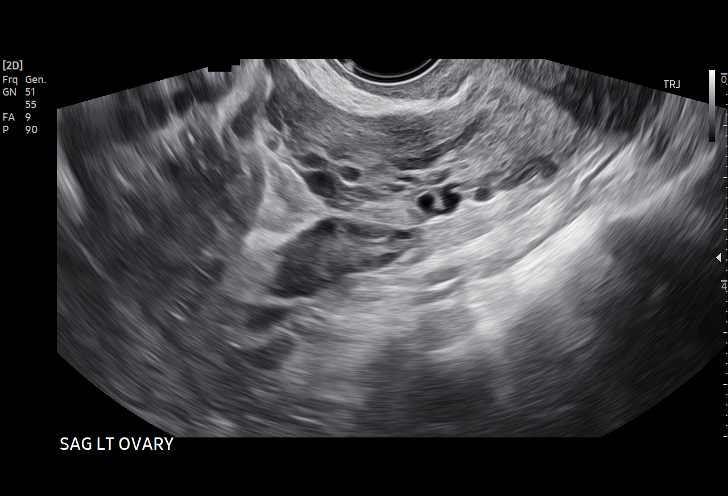
[im 49/49]
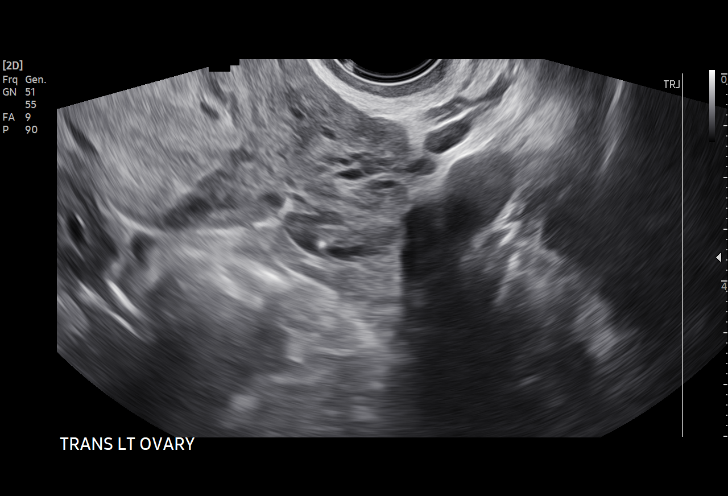

[15 of 28 positions shown; findings below may reference images not displayed]

FINDINGS: Intrauterine gestational sac: Single

Yolk sac:  Visualized.

Embryo:  Visualized.

Cardiac Activity: Visualized.

Heart Rate: 132 bpm

CRL:   10.2 mm   7 w 1 d                  US EDC: 06/08/2021

Subchorionic hemorrhage:  None visualized.

Maternal uterus/adnexae: Right ovarian corpus luteal cyst. Ovaries
are otherwise unremarkable. No free fluid within the pelvis.
IMPRESSION: 1. Single live intrauterine gestation measuring 7 weeks 1 day by
crown-rump length.
2. Active embryonic heart tones detected at 132 bpm.

## 2022-04-28 DIAGNOSIS — Z3200 Encounter for pregnancy test, result unknown: Secondary | ICD-10-CM | POA: Diagnosis not present

## 2022-04-28 DIAGNOSIS — Z3491 Encounter for supervision of normal pregnancy, unspecified, first trimester: Secondary | ICD-10-CM | POA: Diagnosis not present

## 2022-04-30 DIAGNOSIS — Z3491 Encounter for supervision of normal pregnancy, unspecified, first trimester: Secondary | ICD-10-CM | POA: Diagnosis not present

## 2022-05-21 DIAGNOSIS — O209 Hemorrhage in early pregnancy, unspecified: Secondary | ICD-10-CM | POA: Diagnosis not present

## 2022-05-21 DIAGNOSIS — Z3201 Encounter for pregnancy test, result positive: Secondary | ICD-10-CM | POA: Diagnosis not present

## 2022-06-15 DIAGNOSIS — Z3481 Encounter for supervision of other normal pregnancy, first trimester: Secondary | ICD-10-CM | POA: Diagnosis not present

## 2022-07-01 DIAGNOSIS — O219 Vomiting of pregnancy, unspecified: Secondary | ICD-10-CM | POA: Diagnosis not present

## 2022-07-01 DIAGNOSIS — Z0283 Encounter for blood-alcohol and blood-drug test: Secondary | ICD-10-CM | POA: Diagnosis not present

## 2022-07-01 DIAGNOSIS — Z8751 Personal history of pre-term labor: Secondary | ICD-10-CM | POA: Diagnosis not present

## 2022-07-01 DIAGNOSIS — Z124 Encounter for screening for malignant neoplasm of cervix: Secondary | ICD-10-CM | POA: Diagnosis not present

## 2022-07-01 DIAGNOSIS — M5430 Sciatica, unspecified side: Secondary | ICD-10-CM | POA: Diagnosis not present

## 2022-07-01 DIAGNOSIS — Z3482 Encounter for supervision of other normal pregnancy, second trimester: Secondary | ICD-10-CM | POA: Diagnosis not present

## 2022-07-01 DIAGNOSIS — Z113 Encounter for screening for infections with a predominantly sexual mode of transmission: Secondary | ICD-10-CM | POA: Diagnosis not present

## 2022-07-01 DIAGNOSIS — Z1379 Encounter for other screening for genetic and chromosomal anomalies: Secondary | ICD-10-CM | POA: Diagnosis not present

## 2022-07-01 DIAGNOSIS — O3680X0 Pregnancy with inconclusive fetal viability, not applicable or unspecified: Secondary | ICD-10-CM | POA: Diagnosis not present

## 2022-07-01 DIAGNOSIS — O09292 Supervision of pregnancy with other poor reproductive or obstetric history, second trimester: Secondary | ICD-10-CM | POA: Diagnosis not present

## 2022-08-19 DIAGNOSIS — Z3482 Encounter for supervision of other normal pregnancy, second trimester: Secondary | ICD-10-CM | POA: Diagnosis not present

## 2022-08-19 DIAGNOSIS — Z3A20 20 weeks gestation of pregnancy: Secondary | ICD-10-CM | POA: Diagnosis not present

## 2022-08-19 DIAGNOSIS — R87810 Cervical high risk human papillomavirus (HPV) DNA test positive: Secondary | ICD-10-CM | POA: Diagnosis not present

## 2022-08-19 DIAGNOSIS — Z3689 Encounter for other specified antenatal screening: Secondary | ICD-10-CM | POA: Diagnosis not present

## 2022-09-04 DIAGNOSIS — R519 Headache, unspecified: Secondary | ICD-10-CM | POA: Diagnosis not present

## 2022-09-04 DIAGNOSIS — O26892 Other specified pregnancy related conditions, second trimester: Secondary | ICD-10-CM | POA: Diagnosis not present

## 2022-09-04 DIAGNOSIS — Z3A22 22 weeks gestation of pregnancy: Secondary | ICD-10-CM | POA: Diagnosis not present

## 2022-09-06 IMAGING — US US ABDOMEN LIMITED RUQ/ASCITES
1 series · 16 of 25 positions shown · non-contrast
Comparison: None.

CLINICAL DATA: Right upper quadrant pain and nausea.

EXAM:
ULTRASOUND ABDOMEN LIMITED RIGHT UPPER QUADRANT

[Series 1: us abdomen limited ruq/ascites · 42 acquisitions, 16 frames shown]
[im 1/42]
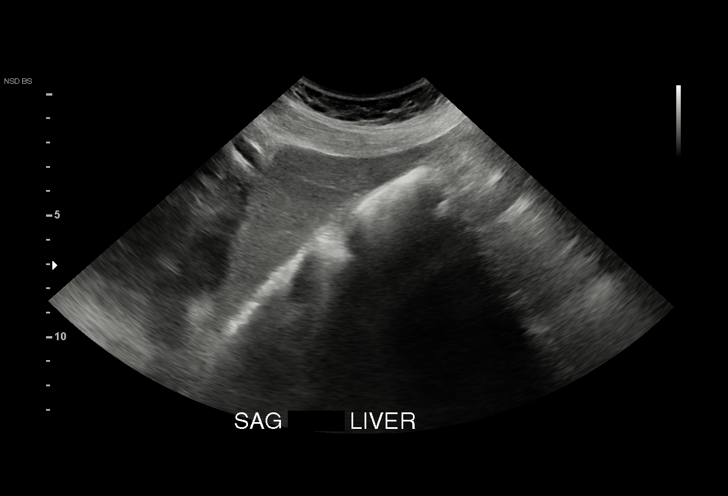
[im 4/42]
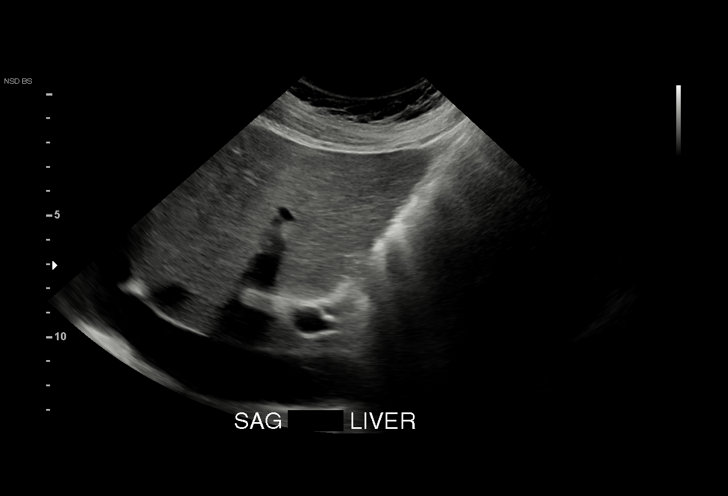
[im 6/42]
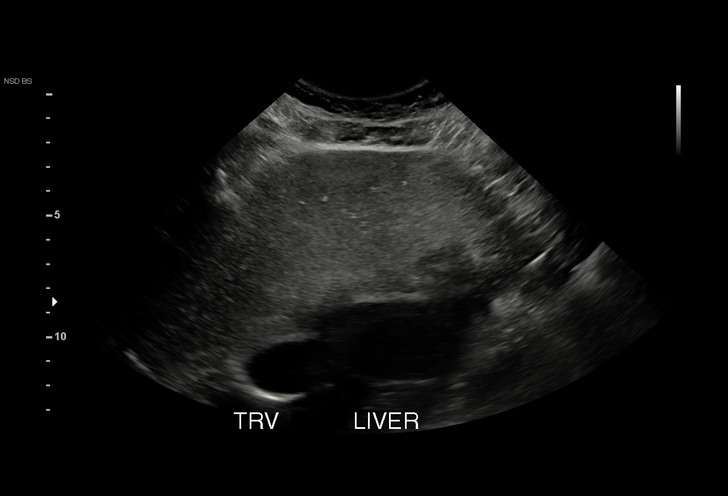
[im 9/42]
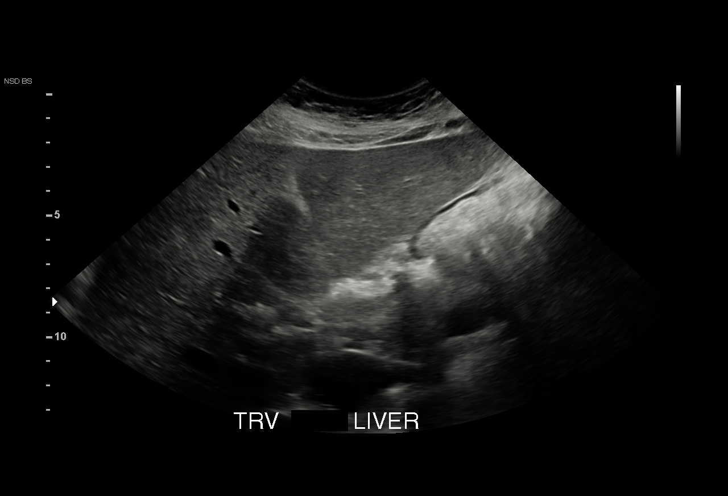
[im 12/42]
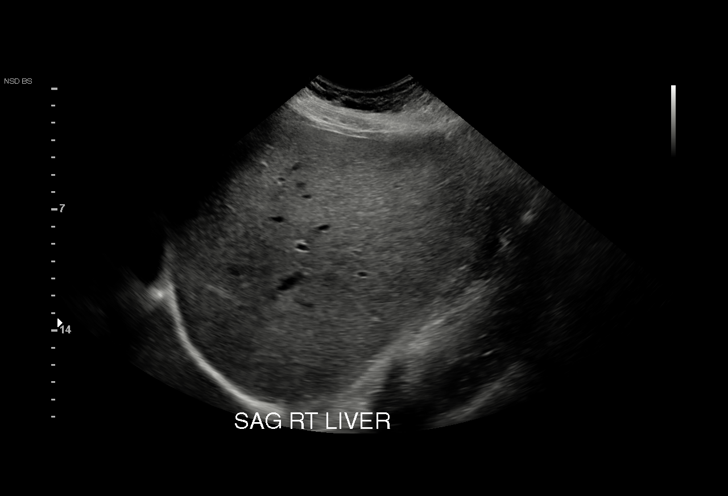
[im 14/42]
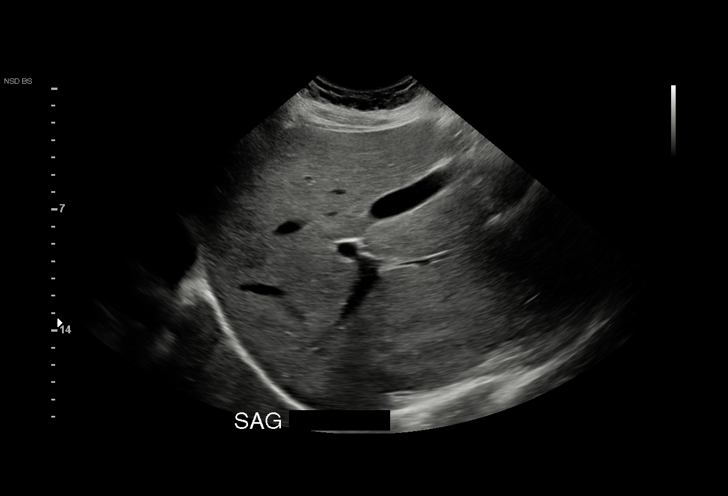
[im 18/42]
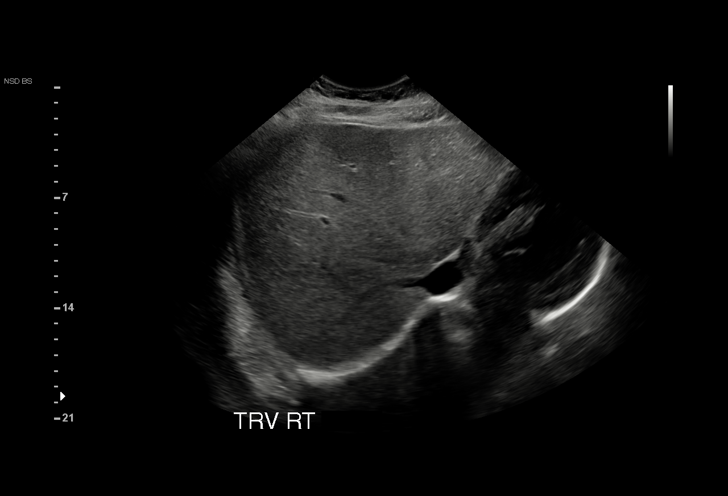
[im 19/42]
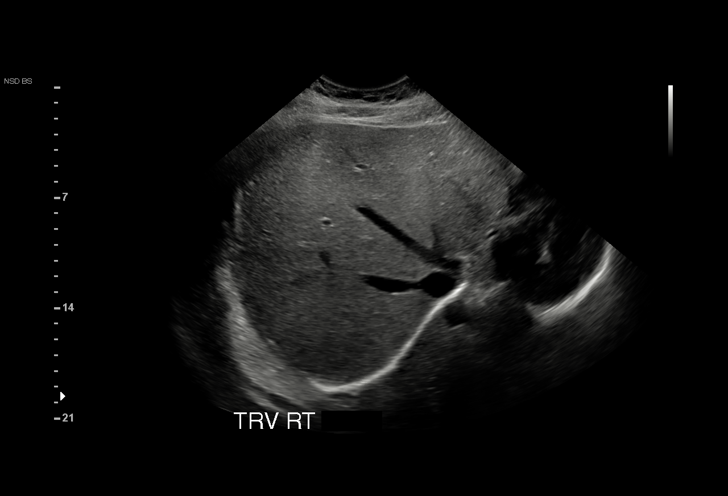
[im 23/42]
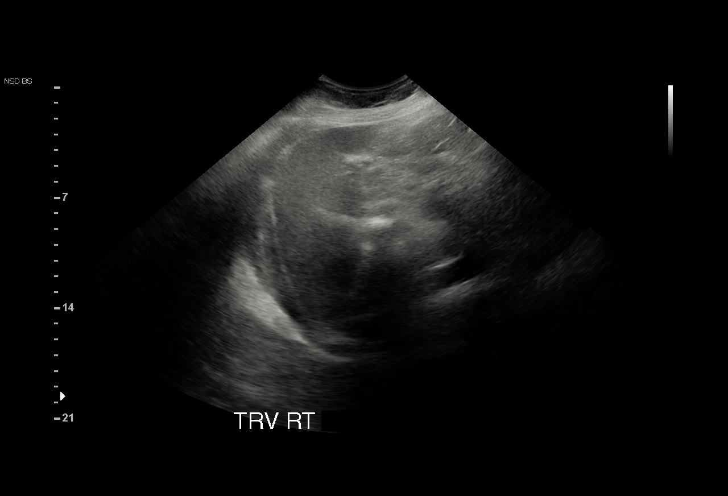
[im 24/42]
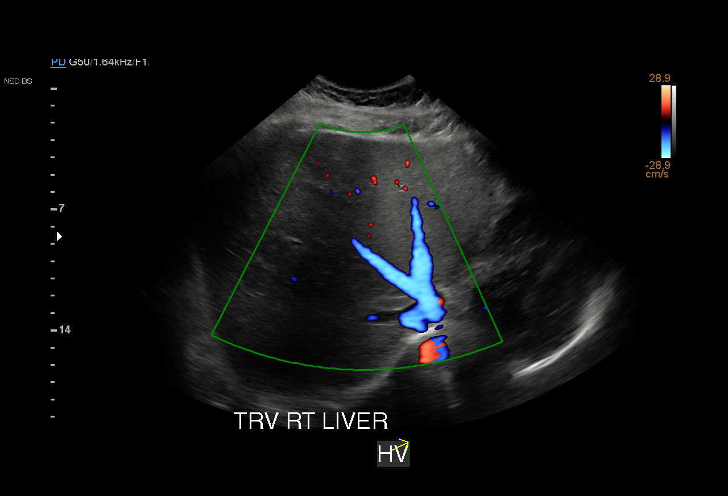
[im 28/42]
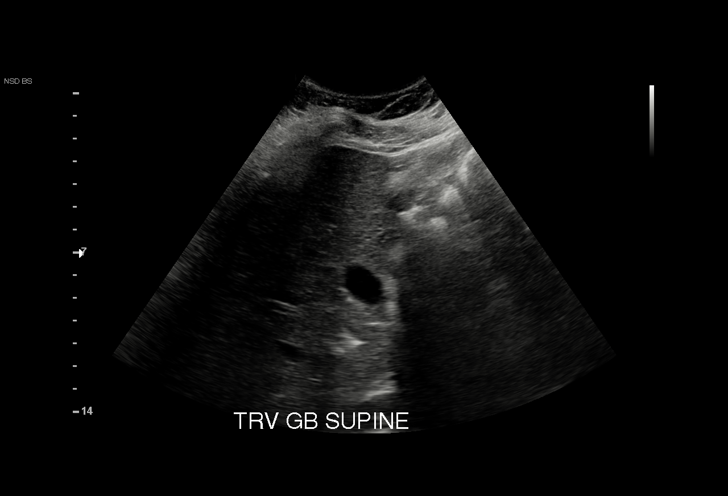
[im 30/42]
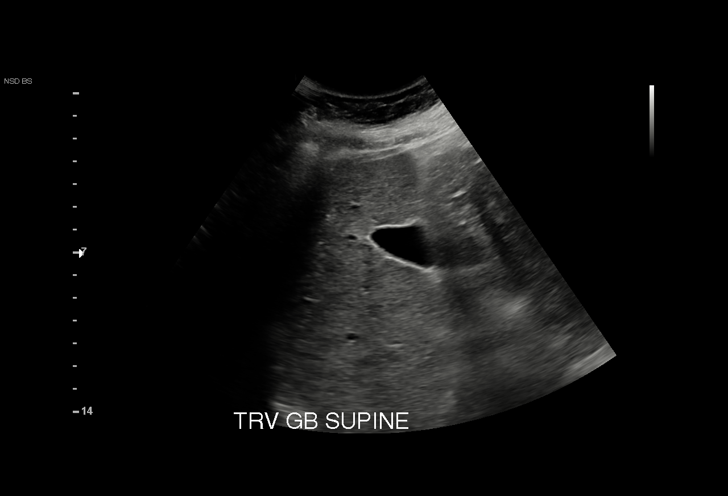
[im 33/42]
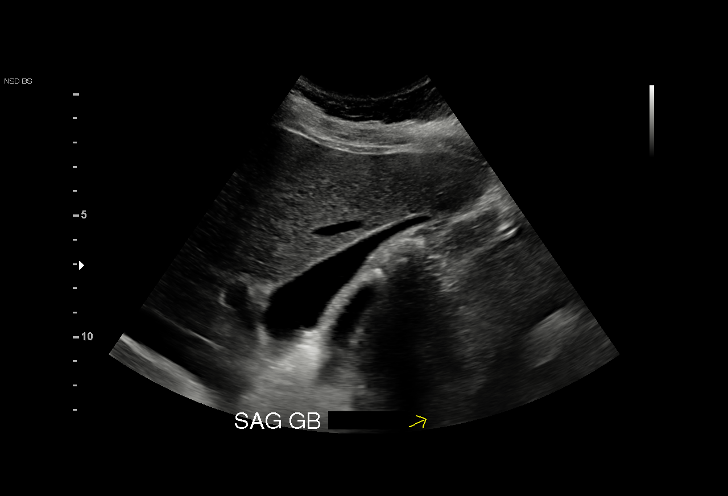
[im 36/42]
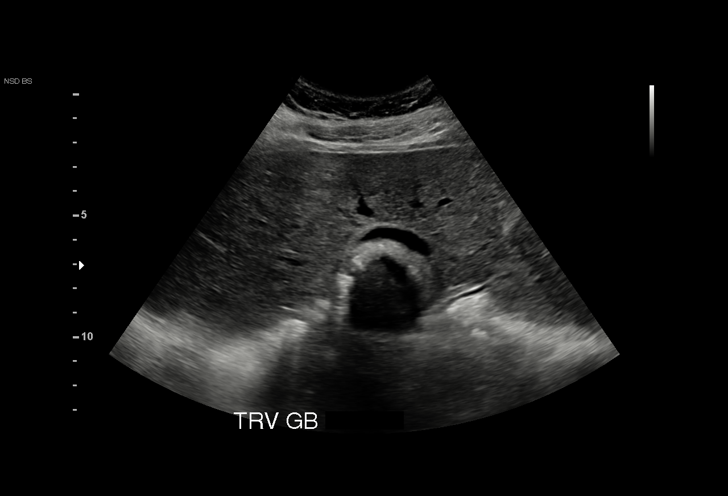
[im 38/42]
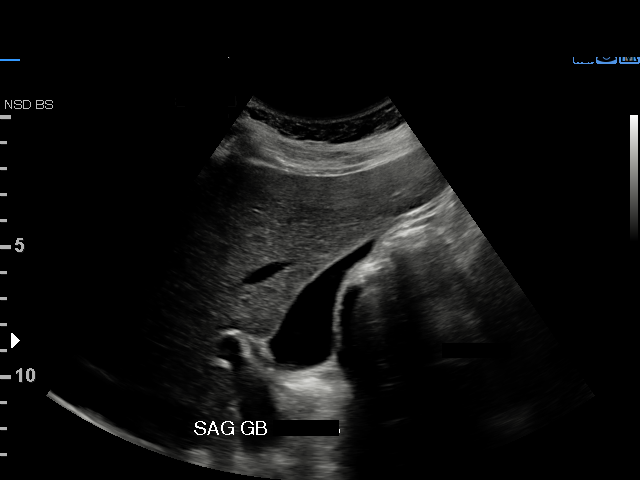
[im 42/42]
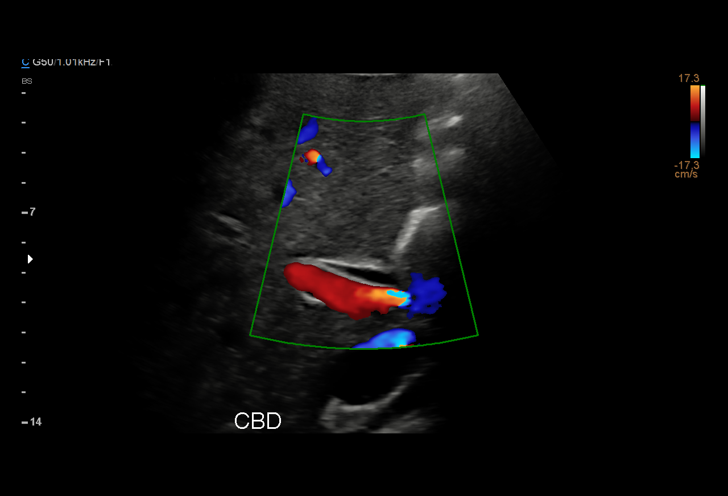

[16 of 25 positions shown; findings below may reference images not displayed]

FINDINGS: Gallbladder:

No gallstones or wall thickening visualized (1.8 mm). No sonographic
Murphy sign noted by sonographer.

Common bile duct:

Diameter: 3.0 mm

Liver:

No focal lesion identified. Within normal limits in parenchymal
echogenicity. Portal vein is patent on color Doppler imaging with
normal direction of blood flow towards the liver.

Other: None.
IMPRESSION: Normal right upper quadrant ultrasound.

## 2022-09-10 DIAGNOSIS — R319 Hematuria, unspecified: Secondary | ICD-10-CM | POA: Diagnosis not present

## 2022-09-10 DIAGNOSIS — Z3A23 23 weeks gestation of pregnancy: Secondary | ICD-10-CM | POA: Diagnosis not present

## 2022-09-10 DIAGNOSIS — O26892 Other specified pregnancy related conditions, second trimester: Secondary | ICD-10-CM | POA: Diagnosis not present

## 2022-09-10 DIAGNOSIS — R109 Unspecified abdominal pain: Secondary | ICD-10-CM | POA: Diagnosis not present

## 2022-09-10 DIAGNOSIS — O2302 Infections of kidney in pregnancy, second trimester: Secondary | ICD-10-CM | POA: Diagnosis not present

## 2022-09-10 DIAGNOSIS — N200 Calculus of kidney: Secondary | ICD-10-CM | POA: Diagnosis not present

## 2022-09-10 DIAGNOSIS — N1330 Unspecified hydronephrosis: Secondary | ICD-10-CM | POA: Diagnosis not present

## 2022-09-10 DIAGNOSIS — N3289 Other specified disorders of bladder: Secondary | ICD-10-CM | POA: Diagnosis not present

## 2022-09-10 DIAGNOSIS — N281 Cyst of kidney, acquired: Secondary | ICD-10-CM | POA: Diagnosis not present

## 2022-09-10 DIAGNOSIS — N10 Acute tubulo-interstitial nephritis: Secondary | ICD-10-CM | POA: Diagnosis not present

## 2022-09-11 DIAGNOSIS — O2302 Infections of kidney in pregnancy, second trimester: Secondary | ICD-10-CM | POA: Diagnosis not present

## 2022-09-11 DIAGNOSIS — N1330 Unspecified hydronephrosis: Secondary | ICD-10-CM | POA: Diagnosis not present

## 2022-09-11 DIAGNOSIS — N200 Calculus of kidney: Secondary | ICD-10-CM | POA: Diagnosis not present

## 2022-09-11 DIAGNOSIS — O26892 Other specified pregnancy related conditions, second trimester: Secondary | ICD-10-CM | POA: Diagnosis not present

## 2022-09-11 DIAGNOSIS — N281 Cyst of kidney, acquired: Secondary | ICD-10-CM | POA: Diagnosis not present

## 2022-09-11 DIAGNOSIS — Z3A23 23 weeks gestation of pregnancy: Secondary | ICD-10-CM | POA: Diagnosis not present

## 2022-09-11 DIAGNOSIS — R109 Unspecified abdominal pain: Secondary | ICD-10-CM | POA: Diagnosis not present

## 2022-09-11 DIAGNOSIS — N3289 Other specified disorders of bladder: Secondary | ICD-10-CM | POA: Diagnosis not present

## 2022-09-13 ENCOUNTER — Encounter (HOSPITAL_COMMUNITY): Payer: Self-pay | Admitting: Obstetrics and Gynecology

## 2022-09-13 ENCOUNTER — Inpatient Hospital Stay (HOSPITAL_COMMUNITY)
Admission: AD | Admit: 2022-09-13 | Discharge: 2022-09-13 | Disposition: A | Discharge status: Home / Self Care | Payer: 59 | Attending: Obstetrics & Gynecology | Admitting: Obstetrics & Gynecology

## 2022-09-13 ENCOUNTER — Inpatient Hospital Stay (HOSPITAL_COMMUNITY): Payer: 59

## 2022-09-13 DIAGNOSIS — Z1152 Encounter for screening for COVID-19: Secondary | ICD-10-CM | POA: Diagnosis not present

## 2022-09-13 DIAGNOSIS — Z3A23 23 weeks gestation of pregnancy: Secondary | ICD-10-CM | POA: Diagnosis not present

## 2022-09-13 DIAGNOSIS — O26832 Pregnancy related renal disease, second trimester: Secondary | ICD-10-CM | POA: Insufficient documentation

## 2022-09-13 DIAGNOSIS — O26892 Other specified pregnancy related conditions, second trimester: Secondary | ICD-10-CM | POA: Insufficient documentation

## 2022-09-13 DIAGNOSIS — N200 Calculus of kidney: Secondary | ICD-10-CM | POA: Diagnosis not present

## 2022-09-13 DIAGNOSIS — J069 Acute upper respiratory infection, unspecified: Secondary | ICD-10-CM

## 2022-09-13 DIAGNOSIS — N136 Pyonephrosis: Secondary | ICD-10-CM | POA: Insufficient documentation

## 2022-09-13 DIAGNOSIS — O2302 Infections of kidney in pregnancy, second trimester: Secondary | ICD-10-CM | POA: Diagnosis not present

## 2022-09-13 DIAGNOSIS — N1330 Unspecified hydronephrosis: Secondary | ICD-10-CM | POA: Diagnosis not present

## 2022-09-13 DIAGNOSIS — Z87442 Personal history of urinary calculi: Secondary | ICD-10-CM | POA: Insufficient documentation

## 2022-09-13 DIAGNOSIS — O99342 Other mental disorders complicating pregnancy, second trimester: Secondary | ICD-10-CM | POA: Diagnosis not present

## 2022-09-13 LAB — URINALYSIS, ROUTINE W REFLEX MICROSCOPIC
Bacteria, UA: NONE SEEN
Bilirubin Urine: NEGATIVE
Glucose, UA: >=500 mg/dL — AB
Hgb urine dipstick: NEGATIVE
Ketones, ur: NEGATIVE mg/dL
Nitrite: NEGATIVE
Protein, ur: NEGATIVE mg/dL
Specific Gravity, Urine: 1.021 (ref 1.005–1.030)
pH: 5 (ref 5.0–8.0)

## 2022-09-13 LAB — CBC WITH DIFFERENTIAL/PLATELET
Abs Immature Granulocytes: 0.06 10*3/uL (ref 0.00–0.07)
Basophils Absolute: 0 10*3/uL (ref 0.0–0.1)
Basophils Relative: 0 %
Eosinophils Absolute: 0.2 10*3/uL (ref 0.0–0.5)
Eosinophils Relative: 1 %
HCT: 30.4 % — ABNORMAL LOW (ref 36.0–46.0)
Hemoglobin: 10.2 g/dL — ABNORMAL LOW (ref 12.0–15.0)
Immature Granulocytes: 1 %
Lymphocytes Relative: 15 %
Lymphs Abs: 1.8 10*3/uL (ref 0.7–4.0)
MCH: 28.1 pg (ref 26.0–34.0)
MCHC: 33.6 g/dL (ref 30.0–36.0)
MCV: 83.7 fL (ref 80.0–100.0)
Monocytes Absolute: 0.8 10*3/uL (ref 0.1–1.0)
Monocytes Relative: 7 %
Neutro Abs: 8.8 10*3/uL — ABNORMAL HIGH (ref 1.7–7.7)
Neutrophils Relative %: 76 %
Platelets: 314 10*3/uL (ref 150–400)
RBC: 3.63 MIL/uL — ABNORMAL LOW (ref 3.87–5.11)
RDW: 14.9 % (ref 11.5–15.5)
WBC: 11.6 10*3/uL — ABNORMAL HIGH (ref 4.0–10.5)
nRBC: 0 % (ref 0.0–0.2)

## 2022-09-13 LAB — COMPREHENSIVE METABOLIC PANEL
ALT: 11 U/L (ref 0–44)
AST: 15 U/L (ref 15–41)
Albumin: 2.7 g/dL — ABNORMAL LOW (ref 3.5–5.0)
Alkaline Phosphatase: 53 U/L (ref 38–126)
Anion gap: 13 (ref 5–15)
BUN: 10 mg/dL (ref 6–20)
CO2: 22 mmol/L (ref 22–32)
Calcium: 9.5 mg/dL (ref 8.9–10.3)
Chloride: 102 mmol/L (ref 98–111)
Creatinine, Ser: 0.6 mg/dL (ref 0.44–1.00)
GFR, Estimated: >60 mL/min (ref >60)
Glucose, Bld: 119 mg/dL — ABNORMAL HIGH (ref 70–99)
Potassium: 3.6 mmol/L (ref 3.5–5.1)
Sodium: 137 mmol/L (ref 135–145)
Total Bilirubin: 0.3 mg/dL (ref 0.3–1.2)
Total Protein: 6.1 g/dL — ABNORMAL LOW (ref 6.5–8.1)

## 2022-09-13 LAB — RESP PANEL BY RT-PCR (FLU A&B, COVID) ARPGX2
Influenza A by PCR: NEGATIVE
Influenza B by PCR: NEGATIVE
SARS Coronavirus 2 by RT PCR: NEGATIVE

## 2022-09-13 MED ORDER — TAMSULOSIN HCL 0.4 MG PO CAPS
0.4000 mg | ORAL_CAPSULE | Freq: Once | ORAL | Status: AC
  Administered 2022-09-13: 0.4 mg via ORAL
  Filled 2022-09-13: qty 1

## 2022-09-13 MED ORDER — ONDANSETRON HCL 4 MG PO TABS: 4.0000 mg | ORAL_TABLET | Freq: Three times a day (TID) | ORAL | 0 refills | Status: DC | PRN

## 2022-09-13 MED ORDER — TAMSULOSIN HCL 0.4 MG PO CAPS: 0.4000 mg | ORAL_CAPSULE | Freq: Every day | ORAL | 0 refills | Status: DC

## 2022-09-13 MED ORDER — LACTATED RINGERS IV BOLUS
1000.0000 mL | Freq: Once | INTRAVENOUS | Status: AC
  Administered 2022-09-13: 1000 mL via INTRAVENOUS

## 2022-09-13 MED ORDER — TRAMADOL HCL 50 MG PO TABS
50.0000 mg | ORAL_TABLET | Freq: Once | ORAL | Status: AC
  Administered 2022-09-13: 50 mg via ORAL
  Filled 2022-09-13: qty 1

## 2022-09-13 MED ORDER — OXYCODONE HCL 5 MG PO TABS: 5.0000 mg | ORAL_TABLET | Freq: Four times a day (QID) | ORAL | 0 refills | Status: DC | PRN

## 2022-09-13 MED ORDER — SODIUM CHLORIDE 0.9 % BOLUS PEDS: 1000.0000 mL | Freq: Once | INTRAVENOUS | Status: DC

## 2022-09-13 NOTE — Discharge Instructions (Signed)

## 2022-09-13 NOTE — MAU Note (Signed)
.  Peggy Mckenzie is a 31 y.o. at [redacted]w[redacted]d here in MAU reporting: left kidney pain. [facility] and diagnosed with a UTI and sent home on antibiotics. Today she is having pain on the left side. Denies bleeding or ROM. Reports positive fetal movement.   Onset of complaint: today Pain score: 5/10 Vitals:   09/13/22 1702  BP: 137/77  Pulse: 81  Resp: 19  Temp: 98.1 F (36.7 C)  SpO2: 98%     FHT:148 Lab orders placed from triage:

## 2022-09-13 NOTE — MAU Provider Note (Cosign Needed Addendum)
History     CSN: [id number]  Arrival date and time: 09/13/22 1630    Chief Complaint  Patient presents with   Back Pain   Patient is a 31 year old G6 P3-1-1-4 at 23 weeks 4 days presenting with left-sided abdominal pain.  She receives most of her care at [facility].  She was seen on 11/3 at [facility] for right-sided abdominal pain and diagnosed with pyelonephritis.  A CT abdomen pelvis was obtained showing moderate right hydroureter nephrosis.  No definite obstructing ureteral stones.  Multiple tiny 1 to 2 mm nonobstructing left renal stones appreciated.  Small right renal cyst appreciated.  Patient was given a dose of IV antibiotics at the hospital and discharged on oral Keflex.  She reports that the pain improved until yesterday and then returned on her left side.  Patient states that the pain is starting similar to the pain that started on her right side.  She is concerned that the antibiotics are not working.  Denies any fever but does acknowledge that she had chills and so took her temperature earlier in the day and it was 99.1.  She has been taking Tylenol to help with the pain with little relief.  Of note patient does have history of ESBL pyelonephritis requiring hospitalization in her previous pregnancy in 2022 as well as kidney stones requiring laser and stents.    OB History     Gravida  6   Para  4   Term  3   Preterm  1   AB  1   Living  4      SAB  1   IAB      Ectopic      Multiple  0   Live Births  4           Past Medical History:  Diagnosis Date   Bipolar 1 disorder (HCC)    Compartment syndrome (HCC) 2006   Left leg   Depression    Fracture 2006   Left  leg requiring rods and screws   Headache    Hemorrhage    post partum hemorrhage   Infection    frequent UTI's   Pyelonephritis during pregnancy 2021    Past Surgical History:  Procedure Laterality Date   CYST REMOVAL HAND     CYSTOLITHOTOMY     FRACTURE SURGERY Left    leg    Family  History  Problem Relation Age of Onset   Birth defects Paternal Aunt        spina bifida   Hypertension Paternal Grandmother     Social History   Tobacco Use   Smoking status: Never   Smokeless tobacco: Never  Vaping Use   Vaping Use: Never used  Substance Use Topics   Alcohol use: No    Comment: occasional,, not now   Drug use: No    Allergies: No Known Allergies  No medications prior to admission.    Review of Systems  Constitutional:  Positive for chills. Negative for fever.  HENT:  Negative for congestion and rhinorrhea.   Eyes:  Negative for visual disturbance.  Respiratory:  Negative for cough and shortness of breath.   Cardiovascular:  Negative for chest pain.  Gastrointestinal:  Positive for abdominal pain. Negative for constipation, diarrhea, nausea and vomiting.  Endocrine: Positive for polyuria.  Genitourinary:  Positive for flank pain. Negative for vaginal bleeding, vaginal discharge and vaginal pain.  Musculoskeletal:  Positive for back pain.   Physical Exam  Blood pressure 119/63, pulse 75, temperature 98.1 F (36.7 C), resp. rate 19, height 5\' 3"  (1.6 m), weight 102.1 kg, SpO2 98 %, currently breastfeeding.  Physical Exam Vitals reviewed.  Constitutional:      Appearance: Normal appearance.  HENT:     Head: Normocephalic and atraumatic.     Nose: Congestion and rhinorrhea present.     Mouth/Throat:     Mouth: Mucous membranes are moist.  Eyes:     Extraocular Movements: Extraocular movements intact.     Pupils: Pupils are equal, round, and reactive to light.  Cardiovascular:     Rate and Rhythm: Normal rate and regular rhythm.     Pulses: Normal pulses.  Pulmonary:     Effort: Pulmonary effort is normal.  Abdominal:     Tenderness: There is abdominal tenderness. There is right CVA tenderness and left CVA tenderness.  Musculoskeletal:        General: Normal range of motion.     Cervical back: Normal range of motion.  Skin:    General:  Skin is warm.     Capillary Refill: Capillary refill takes less than 2 seconds.  Neurological:     General: No focal deficit present.     Mental Status: She is alert.  Psychiatric:        Mood and Affect: Mood normal.    Results for orders placed or performed during the hospital encounter of 09/13/22 (from the past 24 hour(s))  CBC with Differential/Platelet     Status: Abnormal   Collection Time: 09/13/22  5:29 PM  Result Value Ref Range   WBC 11.6 (H) 4.0 - 10.5 K/uL   RBC 3.63 (L) 3.87 - 5.11 MIL/uL   Hemoglobin 10.2 (L) 12.0 - 15.0 g/dL   HCT 30.4 (L) 36.0 - 46.0 %   MCV 83.7 80.0 - 100.0 fL   MCH 28.1 26.0 - 34.0 pg   MCHC 33.6 30.0 - 36.0 g/dL   RDW 14.9 11.5 - 15.5 %   Platelets 314 150 - 400 K/uL   nRBC 0.0 0.0 - 0.2 %   Neutrophils Relative % 76 %   Neutro Abs 8.8 (H) 1.7 - 7.7 K/uL   Lymphocytes Relative 15 %   Lymphs Abs 1.8 0.7 - 4.0 K/uL   Monocytes Relative 7 %   Monocytes Absolute 0.8 0.1 - 1.0 K/uL   Eosinophils Relative 1 %   Eosinophils Absolute 0.2 0.0 - 0.5 K/uL   Basophils Relative 0 %   Basophils Absolute 0.0 0.0 - 0.1 K/uL   Immature Granulocytes 1 %   Abs Immature Granulocytes 0.06 0.00 - 0.07 K/uL  Comprehensive metabolic panel     Status: Abnormal   Collection Time: 09/13/22  5:29 PM  Result Value Ref Range   Sodium 137 135 - 145 mmol/L   Potassium 3.6 3.5 - 5.1 mmol/L   Chloride 102 98 - 111 mmol/L   CO2 22 22 - 32 mmol/L   Glucose, Bld 119 (H) 70 - 99 mg/dL   BUN 10 6 - 20 mg/dL   Creatinine, Ser 0.60 0.44 - 1.00 mg/dL   Calcium 9.5 8.9 - 10.3 mg/dL   Total Protein 6.1 (L) 6.5 - 8.1 g/dL   Albumin 2.7 (L) 3.5 - 5.0 g/dL   AST 15 15 - 41 U/L   ALT 11 0 - 44 U/L   Alkaline Phosphatase 53 38 - 126 U/L   Total Bilirubin 0.3 0.3 - 1.2 mg/dL   GFR, Estimated >60 >60  mL/min   Anion gap 13 5 - 15  Urinalysis, Routine w reflex microscopic     Status: Abnormal   Collection Time: 09/13/22  5:37 PM  Result Value Ref Range   Color, Urine YELLOW  YELLOW   APPearance HAZY (A) CLEAR   Specific Gravity, Urine 1.021 1.005 - 1.030   pH 5.0 5.0 - 8.0   Glucose, UA >=500 (A) NEGATIVE mg/dL   Hgb urine dipstick NEGATIVE NEGATIVE   Bilirubin Urine NEGATIVE NEGATIVE   Ketones, ur NEGATIVE NEGATIVE mg/dL   Protein, ur NEGATIVE NEGATIVE mg/dL   Nitrite NEGATIVE NEGATIVE   Leukocytes,Ua TRACE (A) NEGATIVE   RBC / HPF 0-5 0 - 5 RBC/hpf   WBC, UA 21-50 0 - 5 WBC/hpf   Bacteria, UA NONE SEEN NONE SEEN   Squamous Epithelial / LPF 0-5 0 - 5   Mucus PRESENT   Resp Panel by RT-PCR (Flu A&B, Covid) Anterior Nasal Swab     Status: None   Collection Time: 09/13/22  8:16 PM   Specimen: Anterior Nasal Swab  Result Value Ref Range   SARS Coronavirus 2 by RT PCR NEGATIVE NEGATIVE   Influenza A by PCR NEGATIVE NEGATIVE   Influenza B by PCR NEGATIVE NEGATIVE   US RENAL  Result Date: 09/13/2022 CLINICAL DATA:  Left-sided low back pain without sciatica EXAM: RENAL / URINARY TRACT ULTRASOUND COMPLETE COMPARISON:  CT 07/16/2020 FINDINGS: Right Kidney: Renal measurements: 10.7 x 4.9 x 5.4 cm = volume: 149 mL. Echogenicity within normal limits. Mild hydronephrosis. Nonobstructing calculus measuring 8 mm. Previous right renal cyst is not well visualized today. Left Kidney: Renal measurements: 13.1 x 5.7 x 6.2 cm = volume: 245 mL. Echogenicity within normal limits. No hydronephrosis. Multiple shadowing stones measuring up to 9 mm in the upper pole and 8 mm in the lower pole. Bladder: Appears normal for degree of bladder distention. Bilateral ureteral jets were visualized. Other: None. IMPRESSION: Bilateral nonobstructing nephrolithiasis. Mild right hydronephrosis. Bilateral ureteral jets were visualized. Electronically Signed   By: Minerva Fester M.D.   On: 09/13/2022 22:26    MAU Course  Procedures  MDM CMP within normal limits CBC showing mild anemia, WBC improved from 17 on 11/3 Urinalysis showing glucose greater than 500, trace leukocytes, negative for  hemoglobin Urine micro showing 21-50 WBCs, no bacteria seen Urine culture Renal ultrasound  Peggy Mckenzie is a 31 year old G6 P3-1-1-4 at [redacted]W[redacted]D presenting for left flank pain.  Patient also having cough, congestion, rhinorrhea.  Left flank pain Recently seen at [facility] diagnosed with pyelonephritis, given 2 g of ceftriaxone discharged home on oral Keflex.  Flank pain was improving and now experiencing left flank pain.  Vital signs within normal limits on arrival. - CMP within normal limits. - CBC showing improving WBC. - UA collected positive for glucose and trace leuks - Urine culture pending - Renal ultrasound ordered and pending at time of shift change  Cough/congestion/rhinorrhea Had COVID 6 weeks ago.  Unlikely this Again but will check for COVID and flu. - Symptomatic management - COVID swab pending  Patient handed off to oncoming provider.  Pending results from renal ultrasound patient may be discharged home with pain management and Flomax.  Celedonio Savage 09/13/2022   Labs and imaging reviewed. Renal US now shows non-obstructing bilateral nephrolithiasis which is likely cause of pain. Reports pain improved with meds. Recommend f/u with urology, she has been seen at [facility] in the past and will call for appt. Will start Flomax. Pyelo  seems to be improving and will continue and finish Keflex (culture from MyChart shows e.coli although no sensitivities but should be sensitive to cephalosporins). Another UC was ordered here. Resp panel negative, recommend supportive care for viral URI. Stable for discharge home.   Assessment and Plan   1. [redacted] weeks gestation of pregnancy   2. Nephrolithiasis   3. Pyelonephritis affecting pregnancy in second trimester   4. Upper respiratory virus    Discharge home Follow up at [facility] as scheduled this week Follow up with [facility] Rx Flomax Ibuprofen prn (short course, not to use after 28 wks) Rx Oxycodone prn severe  pain/breakthrough Return precautions   Allergies as of 09/13/2022   No Known Allergies      Medication List     STOP taking these medications    amphetamine-dextroamphetamine 10 MG tablet Commonly known as: ADDERALL   ferrous sulfate 325 (65 FE) MG tablet   magnesium oxide 400 (240 Mg) MG tablet Commonly known as: MAG-OX   NIFEdipine 30 MG 24 hr tablet Commonly known as: ADALAT CC   nitrofurantoin (macrocrystal-monohydrate) 100 MG capsule Commonly known as: Macrobid   sertraline 100 MG tablet Commonly known as: ZOLOFT   Wegovy 0.5 MG/0.5ML Soaj Generic drug: Semaglutide-Weight Management       TAKE these medications    acetaminophen 325 MG tablet Commonly known as: Tylenol Take 2 tablets (650 mg total) by mouth every 4 (four) hours as needed (for pain scale < 4).   cephALEXin 500 MG capsule Commonly known as: KEFLEX Take 500 mg by mouth 3 (three) times daily.   ondansetron 4 MG tablet Commonly known as: Zofran Take 1 tablet (4 mg total) by mouth every 8 (eight) hours as needed for nausea or vomiting.   oxyCODONE 5 MG immediate release tablet Commonly known as: Roxicodone Take 1 tablet (5 mg total) by mouth every 6 (six) hours as needed for severe pain or breakthrough pain. What changed: reasons to take this   prenatal multivitamin Tabs tablet Take 1 tablet by mouth daily at 12 noon.   tamsulosin 0.4 MG Caps capsule Commonly known as: FLOMAX Take 1 capsule (0.4 mg total) by mouth daily.       Donette Larry, CNM  09/13/2022 11:27 PM

## 2022-09-14 LAB — URINE CULTURE: Culture: NO GROWTH

## 2022-09-16 DIAGNOSIS — R8290 Unspecified abnormal findings in urine: Secondary | ICD-10-CM | POA: Diagnosis not present

## 2022-09-16 DIAGNOSIS — R109 Unspecified abdominal pain: Secondary | ICD-10-CM | POA: Diagnosis not present

## 2022-09-16 DIAGNOSIS — Z3A24 24 weeks gestation of pregnancy: Secondary | ICD-10-CM | POA: Diagnosis not present

## 2022-09-16 DIAGNOSIS — Z3482 Encounter for supervision of other normal pregnancy, second trimester: Secondary | ICD-10-CM | POA: Diagnosis not present

## 2022-09-16 DIAGNOSIS — Z3689 Encounter for other specified antenatal screening: Secondary | ICD-10-CM | POA: Diagnosis not present

## 2022-09-16 DIAGNOSIS — N200 Calculus of kidney: Secondary | ICD-10-CM | POA: Diagnosis not present

## 2022-09-16 DIAGNOSIS — N1330 Unspecified hydronephrosis: Secondary | ICD-10-CM | POA: Diagnosis not present

## 2022-09-25 DIAGNOSIS — Z3482 Encounter for supervision of other normal pregnancy, second trimester: Secondary | ICD-10-CM | POA: Diagnosis not present

## 2022-09-25 DIAGNOSIS — Z3483 Encounter for supervision of other normal pregnancy, third trimester: Secondary | ICD-10-CM | POA: Diagnosis not present

## 2022-10-14 DIAGNOSIS — Z3483 Encounter for supervision of other normal pregnancy, third trimester: Secondary | ICD-10-CM | POA: Diagnosis not present

## 2022-10-14 DIAGNOSIS — Z23 Encounter for immunization: Secondary | ICD-10-CM | POA: Diagnosis not present

## 2022-11-07 ENCOUNTER — Other Ambulatory Visit: Payer: Self-pay

## 2022-11-07 ENCOUNTER — Inpatient Hospital Stay (HOSPITAL_COMMUNITY)
Admission: AD | Admit: 2022-11-07 | Discharge: 2022-11-07 | Disposition: A | Discharge status: Home / Self Care | Payer: 59 | Attending: Obstetrics and Gynecology | Admitting: Obstetrics and Gynecology

## 2022-11-07 ENCOUNTER — Encounter (HOSPITAL_COMMUNITY): Payer: Self-pay | Admitting: Obstetrics and Gynecology

## 2022-11-07 DIAGNOSIS — M549 Dorsalgia, unspecified: Secondary | ICD-10-CM | POA: Insufficient documentation

## 2022-11-07 DIAGNOSIS — Z3A31 31 weeks gestation of pregnancy: Secondary | ICD-10-CM | POA: Diagnosis not present

## 2022-11-07 DIAGNOSIS — Z1152 Encounter for screening for COVID-19: Secondary | ICD-10-CM | POA: Diagnosis not present

## 2022-11-07 DIAGNOSIS — W109XXA Fall (on) (from) unspecified stairs and steps, initial encounter: Secondary | ICD-10-CM | POA: Insufficient documentation

## 2022-11-07 DIAGNOSIS — R519 Headache, unspecified: Secondary | ICD-10-CM

## 2022-11-07 DIAGNOSIS — O99513 Diseases of the respiratory system complicating pregnancy, third trimester: Secondary | ICD-10-CM | POA: Diagnosis not present

## 2022-11-07 DIAGNOSIS — O98513 Other viral diseases complicating pregnancy, third trimester: Secondary | ICD-10-CM | POA: Insufficient documentation

## 2022-11-07 DIAGNOSIS — Y9301 Activity, walking, marching and hiking: Secondary | ICD-10-CM | POA: Diagnosis not present

## 2022-11-07 DIAGNOSIS — O26893 Other specified pregnancy related conditions, third trimester: Secondary | ICD-10-CM | POA: Insufficient documentation

## 2022-11-07 DIAGNOSIS — R102 Pelvic and perineal pain: Secondary | ICD-10-CM | POA: Insufficient documentation

## 2022-11-07 DIAGNOSIS — O99891 Other specified diseases and conditions complicating pregnancy: Secondary | ICD-10-CM | POA: Insufficient documentation

## 2022-11-07 DIAGNOSIS — J101 Influenza due to other identified influenza virus with other respiratory manifestations: Secondary | ICD-10-CM | POA: Insufficient documentation

## 2022-11-07 DIAGNOSIS — T148XXA Other injury of unspecified body region, initial encounter: Secondary | ICD-10-CM

## 2022-11-07 LAB — COMPREHENSIVE METABOLIC PANEL
ALT: 15 U/L (ref 0–44)
AST: 26 U/L (ref 15–41)
Albumin: 2.7 g/dL — ABNORMAL LOW (ref 3.5–5.0)
Alkaline Phosphatase: 59 U/L (ref 38–126)
Anion gap: 10 (ref 5–15)
BUN: <5 mg/dL — ABNORMAL LOW (ref 6–20)
CO2: 21 mmol/L — ABNORMAL LOW (ref 22–32)
Calcium: 8.4 mg/dL — ABNORMAL LOW (ref 8.9–10.3)
Chloride: 104 mmol/L (ref 98–111)
Creatinine, Ser: 0.58 mg/dL (ref 0.44–1.00)
GFR, Estimated: >60 mL/min (ref >60)
Glucose, Bld: 102 mg/dL — ABNORMAL HIGH (ref 70–99)
Potassium: 3.8 mmol/L (ref 3.5–5.1)
Sodium: 135 mmol/L (ref 135–145)
Total Bilirubin: 0.2 mg/dL — ABNORMAL LOW (ref 0.3–1.2)
Total Protein: 6 g/dL — ABNORMAL LOW (ref 6.5–8.1)

## 2022-11-07 LAB — CBC
HCT: 31.2 % — ABNORMAL LOW (ref 36.0–46.0)
Hemoglobin: 9.6 g/dL — ABNORMAL LOW (ref 12.0–15.0)
MCH: 26.4 pg (ref 26.0–34.0)
MCHC: 30.8 g/dL (ref 30.0–36.0)
MCV: 86 fL (ref 80.0–100.0)
Platelets: 267 10*3/uL (ref 150–400)
RBC: 3.63 MIL/uL — ABNORMAL LOW (ref 3.87–5.11)
RDW: 16.5 % — ABNORMAL HIGH (ref 11.5–15.5)
WBC: 9.1 10*3/uL (ref 4.0–10.5)
nRBC: 0 % (ref 0.0–0.2)

## 2022-11-07 LAB — PROTEIN / CREATININE RATIO, URINE
Creatinine, Urine: 118 mg/dL
Protein Creatinine Ratio: 0.13 mg/mg{Cre} (ref 0.00–0.15)
Total Protein, Urine: 15 mg/dL

## 2022-11-07 LAB — WET PREP, GENITAL
Clue Cells Wet Prep HPF POC: NONE SEEN
Sperm: NONE SEEN
Trich, Wet Prep: NONE SEEN
WBC, Wet Prep HPF POC: >=10 — AB (ref <10)
Yeast Wet Prep HPF POC: NONE SEEN

## 2022-11-07 LAB — RESP PANEL BY RT-PCR (RSV, FLU A&B, COVID)  RVPGX2
Influenza A by PCR: POSITIVE — AB
Influenza B by PCR: NEGATIVE
Resp Syncytial Virus by PCR: NEGATIVE
SARS Coronavirus 2 by RT PCR: NEGATIVE

## 2022-11-07 MED ORDER — CYCLOBENZAPRINE HCL 5 MG PO TABS
10.0000 mg | ORAL_TABLET | Freq: Once | ORAL | Status: AC
  Administered 2022-11-07: 10 mg via ORAL
  Filled 2022-11-07: qty 2

## 2022-11-07 MED ORDER — ACETAMINOPHEN-CAFFEINE 500-65 MG PO TABS
1.0000 | ORAL_TABLET | Freq: Once | ORAL | Status: AC
  Administered 2022-11-07: 1 via ORAL
  Filled 2022-11-07: qty 1

## 2022-11-07 MED ORDER — OSELTAMIVIR PHOSPHATE 75 MG PO CAPS: 75.0000 mg | ORAL_CAPSULE | Freq: Two times a day (BID) | ORAL | 0 refills | Status: DC

## 2022-11-07 NOTE — MAU Note (Signed)
.  Peggy Mckenzie is a 31 y.o. at [redacted]w[redacted]d here in MAU reporting: fell last night around 2300 walking up the stairs. Stated she did not fall directly on stomach but felt like she pulled muscles trying to catch herself. Pain started in her groin, abdominal pain, and cramping in lower back. Denies any VB but has noticed LOF that started yesterday off and on that she's had to wear a pad for. Clear fluid and has no odor. States she started feeling sick yesterday; sneezing, coughing, runny nose, body aches, and feverish last night. Headache started 2 days ago. Took tylenol 1000mg  at 2100 and 0300 and the headache was not relieved.   Onset of complaint: 2300 Pain scores:  Left side/ left hip 5/6 Lower back 5/6  Vitals:   11/07/22 0922  BP: 128/70  Pulse: (!) 112  Resp: 18  Temp: 100.1 F (37.8 C)      FHT:145

## 2022-11-07 NOTE — MAU Provider Note (Signed)
History     CSN: [id number]  Arrival date and time: 11/07/22 3299   Event Date/Time   First Provider Initiated Contact with Patient 11/07/22 [phone]      Chief Complaint  Patient presents with   Back Pain   Fall   Abdominal Pain   Pelvic Pain   Peggy Mckenzie , a  31 y.o. [id number] at [redacted]w[redacted]d presents to MAU with complaints of abdominal pain, back pain, leaking of fluid with headache after tripping up her stairs last night. Patient reports she was walking up the stairs and tripped. She denies falling on her abdomen, or having any direct trauma to her belly. She endorses positive fetal movement immediately after the incident and has continued to feel movement since. At time of fall she states she felt an immediate "pulling, and cramping" sensation mainly on he left side and radiating to her low back. Possible pulled muscle from the fall. She attempted hot shower and bath without relief. Pain worsened by movement like rolling over and walking. Currently rates pain a 5/10.  She denies vaginal bleeding, but endorses some leaking of fluid.   Patient reports with sneezing, and coughing and noted feeling leaking of fluid with force. Patient "believes" this is just stress incontinence from multi-gravida and current gravid uterus but is unsure. She denies color or odor with leaking but states she noticed it even while sitting and just after urination.    She also reports an on-going headache for the last 2 days. Patient states she she started feeling "sick" yesterday but the headache was from the day prior to that. Reports flashes and floaters with headache onset, but denies at this time. She noted that she attempted to take Tylenol without relief. Last dose was at 0300. Currently rating pain a 5/10. Patient endorses PreE with previous pregnancy, but noted BP has been normal in this pregnancy.   She also endorses cold and flu like symptoms that started yesterday. She endorses her son is also having  similar symptoms. Denies a fever but endorses feeling feverish since yesterday,       Back Pain Associated symptoms include abdominal pain, a fever, headaches and pelvic pain. Pertinent negatives include no chest pain, dysuria or weakness.  Fall Associated symptoms include abdominal pain, a fever and headaches. Pertinent negatives include no nausea or vomiting.  Abdominal Pain Associated symptoms include a fever and headaches. Pertinent negatives include no constipation, diarrhea, dysuria, nausea or vomiting.  Pelvic Pain The patient's primary symptoms include pelvic pain and vaginal discharge. Associated symptoms include abdominal pain, back pain, chills, a fever and headaches. Pertinent negatives include no constipation, diarrhea, dysuria, nausea or vomiting.    OB History     Gravida  6   Para  4   Term  3   Preterm  1   AB  1   Living  4      SAB  1   IAB      Ectopic      Multiple  0   Live Births  4           Past Medical History:  Diagnosis Date   Bipolar 1 disorder (HCC)    Compartment syndrome (HCC) 2006   Left leg   Depression    Fracture 2006   Left  leg requiring rods and screws   Headache    Hemorrhage    post partum hemorrhage   Infection    frequent UTI's   Pyelonephritis during pregnancy  2021    Past Surgical History:  Procedure Laterality Date   CYST REMOVAL HAND     CYSTOLITHOTOMY     FRACTURE SURGERY Left    leg    Family History  Problem Relation Age of Onset   Birth defects Paternal Aunt        spina bifida   Hypertension Paternal Grandmother     Social History   Tobacco Use   Smoking status: Never   Smokeless tobacco: Never  Vaping Use   Vaping Use: Never used  Substance Use Topics   Alcohol use: No    Comment: occasional,, not now   Drug use: No    Allergies: No Known Allergies  No medications prior to admission.    Review of Systems  Constitutional:  Positive for chills, fatigue and fever.  HENT:   Positive for congestion, sinus pressure, sinus pain and sneezing.   Eyes:  Positive for visual disturbance. Negative for pain.  Respiratory:  Positive for cough. Negative for apnea, shortness of breath and wheezing.   Cardiovascular:  Negative for chest pain and palpitations.  Gastrointestinal:  Positive for abdominal pain. Negative for constipation, diarrhea, nausea and vomiting.  Genitourinary:  Positive for pelvic pain and vaginal discharge. Negative for difficulty urinating, dysuria, vaginal bleeding and vaginal pain.  Musculoskeletal:  Positive for back pain.  Neurological:  Positive for headaches. Negative for seizures and weakness.  Psychiatric/Behavioral:  Negative for suicidal ideas.    Physical Exam   Blood pressure (!) 111/56, pulse 93, temperature 99.8 F (37.7 C), temperature source Oral, resp. rate 18, height 5\' 3"  (1.6 m), weight 105.7 kg, currently breastfeeding.  Physical Exam Vitals and nursing note reviewed. Exam conducted with a chaperone present.  Constitutional:      General: She is not in acute distress.    Appearance: Normal appearance. She is ill-appearing.  HENT:     Head: Normocephalic.  Cardiovascular:     Rate and Rhythm: Normal rate and regular rhythm.  Pulmonary:     Effort: Pulmonary effort is normal.     Breath sounds: Normal breath sounds.  Abdominal:     Palpations: Abdomen is soft.     Tenderness: There is no abdominal tenderness.     Comments: Patient reports pain from left lower groin up to left hip and around to the back .  Genitourinary:    Vagina: Vaginal discharge present.     Cervix: Dilated.     Comments: Moderate amount of Thin white vaginal discharge.  Musculoskeletal:     Cervical back: Normal range of motion.  Skin:    General: Skin is warm and dry.     Capillary Refill: Capillary refill takes less than 2 seconds.  Neurological:     Mental Status: She is alert and oriented to person, place, and time.  Psychiatric:        Mood  and Affect: Mood normal.    FHT: 150 bpm with moderate variability. Accels present. No decels noted (appropriate for gestational age).  Toco : Quiet   Dilation: 1.5 Presentation: Vertex Exam by:: [email or web address] CNM   MAU Course  Procedures Orders Placed This Encounter  Procedures   Resp panel by RT-PCR (RSV, Flu A&B, Covid) Anterior Nasal Swab   Wet prep, genital   CBC   Comprehensive metabolic panel   Protein / creatinine ratio, urine   Airborne and Contact precautions   Discharge patient   Meds ordered this encounter  Medications   cyclobenzaprine (FLEXERIL) tablet  10 mg   acetaminophen-caffeine (EXCEDRIN TENSION HEADACHE) 500-65 MG per tablet 1 tablet   oseltamivir (TAMIFLU) 75 MG capsule    Sig: Take 1 capsule (75 mg total) by mouth every 12 (twelve) hours.    Dispense:  10 capsule    Refill:  0    Order Specific Question:   Supervising Provider    Answer:   Samara SnideRATT, TANYA S [[id number]]   Results for orders placed or performed during the hospital encounter of 11/07/22 (from the past 24 hour(s))  Resp panel by RT-PCR (RSV, Flu A&B, Covid) Anterior Nasal Swab     Status: Abnormal   Collection Time: 11/07/22  9:40 AM   Specimen: Anterior Nasal Swab  Result Value Ref Range   SARS Coronavirus 2 by RT PCR NEGATIVE NEGATIVE   Influenza A by PCR POSITIVE (A) NEGATIVE   Influenza B by PCR NEGATIVE NEGATIVE   Resp Syncytial Virus by PCR NEGATIVE NEGATIVE  Wet prep, genital     Status: Abnormal   Collection Time: 11/07/22 10:18 AM   Specimen: Cervix  Result Value Ref Range   Yeast Wet Prep HPF POC NONE SEEN NONE SEEN   Trich, Wet Prep NONE SEEN NONE SEEN   Clue Cells Wet Prep HPF POC NONE SEEN NONE SEEN   WBC, Wet Prep HPF POC >=10 (A) <10   Sperm NONE SEEN   Protein / creatinine ratio, urine     Status: None   Collection Time: 11/07/22 10:34 AM  Result Value Ref Range   Creatinine, Urine 118 mg/dL   Total Protein, Urine 15 mg/dL   Protein Creatinine Ratio 0.13 0.00 - 0.15  mg/mg[Cre]  CBC     Status: Abnormal   Collection Time: 11/07/22 10:35 AM  Result Value Ref Range   WBC 9.1 4.0 - 10.5 K/uL   RBC 3.63 (L) 3.87 - 5.11 MIL/uL   Hemoglobin 9.6 (L) 12.0 - 15.0 g/dL   HCT 40.931.2 (L) 81.136.0 - 91.446.0 %   MCV 86.0 80.0 - 100.0 fL   MCH 26.4 26.0 - 34.0 pg   MCHC 30.8 30.0 - 36.0 g/dL   RDW 78.216.5 (H) 95.611.5 - 21.315.5 %   Platelets 267 150 - 400 K/uL   nRBC 0.0 0.0 - 0.2 %  Comprehensive metabolic panel     Status: Abnormal   Collection Time: 11/07/22 10:35 AM  Result Value Ref Range   Sodium 135 135 - 145 mmol/L   Potassium 3.8 3.5 - 5.1 mmol/L   Chloride 104 98 - 111 mmol/L   CO2 21 (L) 22 - 32 mmol/L   Glucose, Bld 102 (H) 70 - 99 mg/dL   BUN <5 (L) 6 - 20 mg/dL   Creatinine, Ser 0.860.58 0.44 - 1.00 mg/dL   Calcium 8.4 (L) 8.9 - 10.3 mg/dL   Total Protein 6.0 (L) 6.5 - 8.1 g/dL   Albumin 2.7 (L) 3.5 - 5.0 g/dL   AST 26 15 - 41 U/L   ALT 15 0 - 44 U/L   Alkaline Phosphatase 59 38 - 126 U/L   Total Bilirubin 0.2 (L) 0.3 - 1.2 mg/dL   GFR, Estimated >57>60 >84>60 mL/min   Anion gap 10 5 - 15    MDM - Fern negative x2 no pooling- Low suspicion for SROM.  - Patient flu A positive  - WBC seen on Wet prep otherwise normal.  - Pre E labs normal and in the absence of other symptoms low suspicion for PReE.  - Abdominal/ back pain  improved from 5/10 -> 3/10.  - Plan for discharge  Assessment and Plan   1. Influenza A virus present   2. Pregnancy headache in third trimester   3. [redacted] weeks gestation of pregnancy   4. Pulled muscle    - Reviewed that headache and pain could be caused by flu and comfort measures discussed.  - Reccommended increased fluid intake and Tamiflu. Rx sent to outpatient pharmacy.  - Worsening signs and return precautions reviewed.  - Patient receives care in [place] and states she has an appointment next week. Encouraged to keep appointment,  - Preterm labor precautions reviewed.  - FHT appropriate for gestational age upon time of discharge.   - Patient discharged home in stable condition and may return to MAU as needed.   Claudette Head, MSN CNM  11/07/2022, 1:14 PM

## 2022-11-12 DIAGNOSIS — O2302 Infections of kidney in pregnancy, second trimester: Secondary | ICD-10-CM | POA: Diagnosis not present

## 2022-11-19 ENCOUNTER — Inpatient Hospital Stay (HOSPITAL_COMMUNITY)
Admission: AD | Admit: 2022-11-19 | Discharge: 2022-11-19 | Disposition: A | Discharge status: Home / Self Care | Payer: 59 | Attending: Obstetrics and Gynecology | Admitting: Obstetrics and Gynecology

## 2022-11-19 ENCOUNTER — Other Ambulatory Visit: Payer: Self-pay

## 2022-11-19 ENCOUNTER — Encounter (HOSPITAL_COMMUNITY): Payer: Self-pay | Admitting: Obstetrics and Gynecology

## 2022-11-19 DIAGNOSIS — R519 Headache, unspecified: Secondary | ICD-10-CM

## 2022-11-19 DIAGNOSIS — O26893 Other specified pregnancy related conditions, third trimester: Secondary | ICD-10-CM | POA: Diagnosis not present

## 2022-11-19 DIAGNOSIS — O10913 Unspecified pre-existing hypertension complicating pregnancy, third trimester: Secondary | ICD-10-CM | POA: Diagnosis not present

## 2022-11-19 DIAGNOSIS — O4703 False labor before 37 completed weeks of gestation, third trimester: Secondary | ICD-10-CM | POA: Diagnosis not present

## 2022-11-19 DIAGNOSIS — Z3A33 33 weeks gestation of pregnancy: Secondary | ICD-10-CM | POA: Diagnosis not present

## 2022-11-19 HISTORY — DX: Gestational (pregnancy-induced) hypertension without significant proteinuria, unspecified trimester: O13.9

## 2022-11-19 HISTORY — DX: Anemia, unspecified: D64.9

## 2022-11-19 LAB — URINALYSIS, ROUTINE W REFLEX MICROSCOPIC
Bilirubin Urine: NEGATIVE
Glucose, UA: NEGATIVE mg/dL
Hgb urine dipstick: NEGATIVE
Ketones, ur: NEGATIVE mg/dL
Leukocytes,Ua: NEGATIVE
Nitrite: NEGATIVE
Protein, ur: NEGATIVE mg/dL
Specific Gravity, Urine: 1.013 (ref 1.005–1.030)
pH: 6 (ref 5.0–8.0)

## 2022-11-19 MED ORDER — ACETAMINOPHEN-CAFFEINE 500-65 MG PO TABS
2.0000 | ORAL_TABLET | Freq: Once | ORAL | Status: AC
  Administered 2022-11-19: 2 via ORAL
  Filled 2022-11-19: qty 2

## 2022-11-19 MED ORDER — LACTATED RINGERS IV BOLUS
1000.0000 mL | Freq: Once | INTRAVENOUS | Status: AC
  Administered 2022-11-19: 1000 mL via INTRAVENOUS

## 2022-11-19 NOTE — MAU Note (Signed)
Peggy Mckenzie is a 32 y.o. at [redacted]w[redacted]d here in MAU reporting: contractions since 1900 last night. They are 5-11 minutes and vary in intensity. Also started feeling increased swelling overnight, BP at home was 130/92 and then 151/98. No LOF or bleeding. +FM  Onset of complaint: last night  Pain score: 5/10  Vitals:   11/19/22 0919  BP: 126/83  Pulse: 83  Resp: 16  Temp: 97.8 F (36.6 C)  SpO2: 99%     FHT:142  Lab orders placed from triage: UA

## 2022-11-19 NOTE — Discharge Instructions (Signed)

## 2022-11-19 NOTE — MAU Provider Note (Signed)
History     CSN: [id number]  Arrival date and time: 11/19/22 2831   Event Date/Time   First Provider Initiated Contact with Patient 11/19/22 [phone]      Chief Complaint  Patient presents with   Contractions   Hypertension   HPI  Peggy Mckenzie is a 32 y.o. [id number] at [redacted]w[redacted]d who presents for evaluation of contractions. Patient reports she is feeling contractions every 10 minutes. Patient rates the pain as a 5/10 and has not tried anything for the pain. She reports she is drinking lots of water and staying off her feet as much as possible. She denies any vaginal bleeding, discharge, and leaking of fluid. Denies any constipation, diarrhea or any urinary complaints. Reports normal fetal movement.  She also reports a headache that she was worried was related to her blood pressure  OB History     Gravida  6   Para  4   Term  3   Preterm  1   AB  1   Living  4      SAB  1   IAB      Ectopic      Multiple  0   Live Births  4           Past Medical History:  Diagnosis Date   Anemia    Bipolar 1 disorder ([place])    Compartment syndrome ([place]) 2006   Left leg   Depression    Fracture 2006   Left  leg requiring rods and screws   Headache    Hemorrhage    post partum hemorrhage   Infection    frequent UTI's   Pregnancy induced hypertension    Pyelonephritis during pregnancy 2021    Past Surgical History:  Procedure Laterality Date   CYST REMOVAL HAND     CYSTOLITHOTOMY     FRACTURE SURGERY Left    leg    Family History  Problem Relation Age of Onset   Birth defects Paternal Aunt        spina bifida   Hypertension Paternal Grandmother     Social History   Tobacco Use   Smoking status: Never   Smokeless tobacco: Never  Vaping Use   Vaping Use: Never used  Substance Use Topics   Alcohol use: No    Comment: occasional,, not now   Drug use: No    Allergies: No Known Allergies  No medications prior to admission.    Review of Systems   Constitutional: Negative.  Negative for fatigue and fever.  HENT: Negative.    Respiratory: Negative.  Negative for shortness of breath.   Cardiovascular: Negative.  Negative for chest pain.  Gastrointestinal:  Positive for abdominal pain. Negative for constipation, diarrhea, nausea and vomiting.  Genitourinary: Negative.  Negative for dysuria, vaginal bleeding and vaginal discharge.  Neurological:  Positive for headaches. Negative for dizziness.   Physical Exam   Blood pressure 121/67, pulse 74, temperature 97.8 F (36.6 C), temperature source Oral, resp. rate 16, height 5\' 3"  (1.6 m), weight 106.9 kg, SpO2 98 %, currently breastfeeding.  Patient Vitals for the past 24 hrs:  BP Temp Temp src Pulse Resp SpO2 Height Weight  11/19/22 1111 121/67 -- -- 74 -- -- -- --  11/19/22 1000 118/62 -- -- 78 -- 98 % -- --  11/19/22 0934 124/72 -- -- 82 -- -- -- --  11/19/22 0919 126/83 97.8 F (36.6 C) Oral 83 16 99 % -- --  11/19/22  0914 -- -- -- -- -- -- 5\' 3"  (1.6 m) 106.9 kg    Physical Exam Vitals and nursing note reviewed.  Constitutional:      General: She is not in acute distress.    Appearance: She is well-developed.  HENT:     Head: Normocephalic.  Eyes:     Pupils: Pupils are equal, round, and reactive to light.  Cardiovascular:     Rate and Rhythm: Normal rate and regular rhythm.     Heart sounds: Normal heart sounds.  Pulmonary:     Effort: Pulmonary effort is normal. No respiratory distress.     Breath sounds: Normal breath sounds.  Abdominal:     General: Bowel sounds are normal. There is no distension.     Palpations: Abdomen is soft.     Tenderness: There is no abdominal tenderness.  Skin:    General: Skin is warm and dry.  Neurological:     Mental Status: She is alert and oriented to person, place, and time.  Psychiatric:        Mood and Affect: Mood normal.        Behavior: Behavior normal.        Thought Content: Thought content normal.        Judgment:  Judgment normal.     Fetal Tracing:  Baseline: 120 Variability: moderate Accels: 15x15 Decels: none  Toco: 2 UC's total  Dilation: 1.5 Exam by:: Maylene Roes CNM   MAU Course  Procedures  Results for orders placed or performed during the hospital encounter of 11/19/22 (from the past 24 hour(s))  Urinalysis, Routine w reflex microscopic Urine, Clean Catch     Status: None   Collection Time: 11/19/22  9:24 AM  Result Value Ref Range   Color, Urine YELLOW YELLOW   APPearance CLEAR CLEAR   Specific Gravity, Urine 1.013 1.005 - 1.030   pH 6.0 5.0 - 8.0   Glucose, UA NEGATIVE NEGATIVE mg/dL   Hgb urine dipstick NEGATIVE NEGATIVE   Bilirubin Urine NEGATIVE NEGATIVE   Ketones, ur NEGATIVE NEGATIVE mg/dL   Protein, ur NEGATIVE NEGATIVE mg/dL   Nitrite NEGATIVE NEGATIVE   Leukocytes,Ua NEGATIVE NEGATIVE     No results found.   MDM Prenatal records from community office not on file.  Labs ordered and reviewed.   UA LR bolus Excedrin tension- patient reports improvement of HA  Assessment and Plan   1. Preterm uterine contractions in third trimester, antepartum   2. Pregnancy headache in third trimester   3. [redacted] weeks gestation of pregnancy     -Discharge home in stable condition -Preterm labor precautions discussed -Patient advised to follow-up with OB as scheduled for prenatal care -Patient may return to MAU as needed or if her condition were to change or worsen  Wende Mott, CNM 11/19/2022, 9:48 AM

## 2022-11-30 ENCOUNTER — Inpatient Hospital Stay (HOSPITAL_COMMUNITY)
Admission: AD | Admit: 2022-11-30 | Discharge: 2022-12-01 | Disposition: A | Discharge status: Home / Self Care | Payer: 59 | Attending: Obstetrics and Gynecology | Admitting: Obstetrics and Gynecology

## 2022-11-30 ENCOUNTER — Encounter (HOSPITAL_COMMUNITY): Payer: Self-pay | Admitting: Obstetrics and Gynecology

## 2022-11-30 DIAGNOSIS — Z3A34 34 weeks gestation of pregnancy: Secondary | ICD-10-CM | POA: Diagnosis not present

## 2022-11-30 DIAGNOSIS — O26893 Other specified pregnancy related conditions, third trimester: Secondary | ICD-10-CM

## 2022-11-30 DIAGNOSIS — R519 Headache, unspecified: Secondary | ICD-10-CM | POA: Insufficient documentation

## 2022-11-30 LAB — CBC
HCT: 31.9 % — ABNORMAL LOW (ref 36.0–46.0)
Hemoglobin: 10.4 g/dL — ABNORMAL LOW (ref 12.0–15.0)
MCH: 27.4 pg (ref 26.0–34.0)
MCHC: 32.6 g/dL (ref 30.0–36.0)
MCV: 84.2 fL (ref 80.0–100.0)
Platelets: 271 10*3/uL (ref 150–400)
RBC: 3.79 MIL/uL — ABNORMAL LOW (ref 3.87–5.11)
RDW: 17 % — ABNORMAL HIGH (ref 11.5–15.5)
WBC: 9.8 10*3/uL (ref 4.0–10.5)
nRBC: 0 % (ref 0.0–0.2)

## 2022-11-30 LAB — URINALYSIS, ROUTINE W REFLEX MICROSCOPIC
Bilirubin Urine: NEGATIVE
Glucose, UA: NEGATIVE mg/dL
Ketones, ur: NEGATIVE mg/dL
Leukocytes,Ua: NEGATIVE
Nitrite: NEGATIVE
Protein, ur: NEGATIVE mg/dL
Specific Gravity, Urine: 1.009 (ref 1.005–1.030)
pH: 6 (ref 5.0–8.0)

## 2022-11-30 MED ORDER — CYCLOBENZAPRINE HCL 5 MG PO TABS
10.0000 mg | ORAL_TABLET | Freq: Once | ORAL | Status: AC
  Administered 2022-11-30: 10 mg via ORAL
  Filled 2022-11-30: qty 2

## 2022-11-30 NOTE — MAU Provider Note (Signed)
History     CSN: [id number]  Arrival date and time: 11/30/22 2213   Event Date/Time   First Provider Initiated Contact with Patient 11/30/22 2253      Chief Complaint  Patient presents with   Headache   HPI  Peggy Mckenzie is a 32 y.o. [id number] at [redacted]w[redacted]d who presents for evaluation of a headache. Patient reports she woke up at 1315 with a headache. She has tried tylenol 2 times this evening with no relief. Patient rates the pain as a 7/10. She also reports feeling sensitive to light and feeling like she was seeing spots. She denies any RUQ pain. She reports she talked to the [facility] fellow on labor and delivery tonight who told her to come to MAU for evaluation for preeclampsia because she has all the symptoms. She reports she checked her BP before coming down and it was normal. She reports her BP has been normal throughout the pregnancy but did have severe preeclampsia in her last pregnancy. She denies any vaginal bleeding, discharge, and leaking of fluid. Denies any constipation, diarrhea or any urinary complaints. Reports normal fetal movement.   OB History     Gravida  6   Para  4   Term  3   Preterm  1   AB  1   Living  4      SAB  1   IAB      Ectopic      Multiple  0   Live Births  4           Past Medical History:  Diagnosis Date   Anemia    Bipolar 1 disorder ([place])    Compartment syndrome ([place]) 2006   Left leg   Depression    Fracture 2006   Left  leg requiring rods and screws   Headache    Hemorrhage    post partum hemorrhage   Infection    frequent UTI's   Pregnancy induced hypertension    Pyelonephritis during pregnancy 2021    Past Surgical History:  Procedure Laterality Date   CYST REMOVAL HAND     CYSTOLITHOTOMY     FRACTURE SURGERY Left    leg    Family History  Problem Relation Age of Onset   Birth defects Paternal Aunt        spina bifida   Hypertension Paternal Grandmother     Social History   Tobacco Use   Smoking  status: Never   Smokeless tobacco: Never  Vaping Use   Vaping Use: Never used  Substance Use Topics   Alcohol use: No    Comment: occasional,, not now   Drug use: No    Allergies: No Known Allergies  Medications Prior to Admission  Medication Sig Dispense Refill Last Dose   acetaminophen (TYLENOL) 325 MG tablet Take 2 tablets (650 mg total) by mouth every 4 (four) hours as needed (for pain scale < 4).   11/30/2022 at 1930   aspirin EC 81 MG tablet Take 81 mg by mouth daily. Swallow whole.   11/30/2022   ferrous sulfate 325 (65 FE) MG EC tablet Take 325 mg by mouth 3 (three) times daily with meals.   11/30/2022   ondansetron (ZOFRAN) 4 MG tablet Take 1 tablet (4 mg total) by mouth every 8 (eight) hours as needed for nausea or vomiting. 20 tablet 0 Past Month   Prenatal Vit-Fe Fumarate-FA (PRENATAL MULTIVITAMIN) TABS tablet Take 1 tablet by mouth daily at 12  noon.   11/30/2022   sertraline (ZOLOFT) 25 MG tablet Take 25 mg by mouth daily.   11/30/2022    Review of Systems  Constitutional: Negative.  Negative for fatigue and fever.  HENT: Negative.    Respiratory: Negative.  Negative for shortness of breath.   Cardiovascular: Negative.  Negative for chest pain.  Gastrointestinal: Negative.  Negative for abdominal pain, constipation, diarrhea, nausea and vomiting.  Genitourinary: Negative.  Negative for dysuria.  Neurological:  Positive for headaches. Negative for dizziness.   Physical Exam   Blood pressure 125/68, pulse 78, temperature 98.7 F (37.1 C), temperature source Oral, resp. rate 18, height 5\' 3"  (1.6 m), weight 108.6 kg, currently breastfeeding.  Patient Vitals for the past 24 hrs:  BP Temp Temp src Pulse Resp SpO2 Height Weight  11/30/22 2332 134/79 -- -- 77 -- 99 % -- --  11/30/22 2315 131/70 -- -- 75 -- 98 % -- --  11/30/22 2312 126/68 -- -- 80 -- -- -- --  11/30/22 2310 -- -- -- -- -- 98 % -- --  11/30/22 2305 -- -- -- -- -- 98 % -- --  11/30/22 2300 -- -- -- -- -- 98  % -- --  11/30/22 2255 -- -- -- -- -- 98 % -- --  11/30/22 2250 -- -- -- -- -- 98 % -- --  11/30/22 2246 125/68 -- -- 78 -- -- -- --  11/30/22 2233 134/73 98.7 F (37.1 C) Oral 81 18 -- 5\' 3"  (1.6 m) 108.6 kg    Physical Exam Vitals and nursing note reviewed.  Constitutional:      General: She is not in acute distress.    Appearance: She is well-developed.  HENT:     Head: Normocephalic.  Eyes:     Pupils: Pupils are equal, round, and reactive to light.  Cardiovascular:     Rate and Rhythm: Normal rate and regular rhythm.     Heart sounds: Normal heart sounds.  Pulmonary:     Effort: Pulmonary effort is normal. No respiratory distress.     Breath sounds: Normal breath sounds.  Abdominal:     General: Bowel sounds are normal. There is no distension.     Palpations: Abdomen is soft.     Tenderness: There is no abdominal tenderness.  Skin:    General: Skin is warm and dry.  Neurological:     Mental Status: She is alert and oriented to person, place, and time.     Motor: No abnormal muscle tone.     Coordination: Coordination normal.     Deep Tendon Reflexes: Reflexes are normal and symmetric. Reflexes normal.  Psychiatric:        Behavior: Behavior normal.        Thought Content: Thought content normal.        Judgment: Judgment normal.     Fetal Tracing:  Baseline: 125 Variability: moderate Accels: 15x15 Decels: none  Toco: occasional uc's  MAU Course  Procedures  Results for orders placed or performed during the hospital encounter of 11/30/22 (from the past 24 hour(s))  Urinalysis, Routine w reflex microscopic Urine, Clean Catch     Status: Abnormal   Collection Time: 11/30/22 11:04 PM  Result Value Ref Range   Color, Urine YELLOW YELLOW   APPearance HAZY (A) CLEAR   Specific Gravity, Urine 1.009 1.005 - 1.030   pH 6.0 5.0 - 8.0   Glucose, UA NEGATIVE NEGATIVE mg/dL   Hgb urine dipstick SMALL (A) NEGATIVE  Bilirubin Urine NEGATIVE NEGATIVE   Ketones, ur  NEGATIVE NEGATIVE mg/dL   Protein, ur NEGATIVE NEGATIVE mg/dL   Nitrite NEGATIVE NEGATIVE   Leukocytes,Ua NEGATIVE NEGATIVE   RBC / HPF 6-10 0 - 5 RBC/hpf   WBC, UA 0-5 0 - 5 WBC/hpf   Bacteria, UA RARE (A) NONE SEEN   Squamous Epithelial / HPF 6-10 0 - 5 /HPF   Mucus PRESENT   CBC     Status: Abnormal   Collection Time: 11/30/22 11:31 PM  Result Value Ref Range   WBC 9.8 4.0 - 10.5 K/uL   RBC 3.79 (L) 3.87 - 5.11 MIL/uL   Hemoglobin 10.4 (L) 12.0 - 15.0 g/dL   HCT 31.9 (L) 36.0 - 46.0 %   MCV 84.2 80.0 - 100.0 fL   MCH 27.4 26.0 - 34.0 pg   MCHC 32.6 30.0 - 36.0 g/dL   RDW 17.0 (H) 11.5 - 15.5 %   Platelets 271 150 - 400 K/uL   nRBC 0.0 0.0 - 0.2 %  Comprehensive metabolic panel     Status: Abnormal   Collection Time: 11/30/22 11:31 PM  Result Value Ref Range   Sodium 134 (L) 135 - 145 mmol/L   Potassium 3.9 3.5 - 5.1 mmol/L   Chloride 103 98 - 111 mmol/L   CO2 21 (L) 22 - 32 mmol/L   Glucose, Bld 71 70 - 99 mg/dL   BUN 6 6 - 20 mg/dL   Creatinine, Ser 0.59 0.44 - 1.00 mg/dL   Calcium 8.9 8.9 - 10.3 mg/dL   Total Protein 6.2 (L) 6.5 - 8.1 g/dL   Albumin 2.8 (L) 3.5 - 5.0 g/dL   AST 23 15 - 41 U/L   ALT 14 0 - 44 U/L   Alkaline Phosphatase 75 38 - 126 U/L   Total Bilirubin 0.3 0.3 - 1.2 mg/dL   GFR, Estimated >60 >60 mL/min   Anion gap 10 5 - 15    MDM Prenatal records from community office reviewed. Pregnancy complicated by hx preterm delivery due to preeclampsia. Labs ordered and reviewed.   Discussed options for HA treatment. Patient requesting labs be done to check for preeclampsia. Discussed normal BPs today and throughout pregnancy so does not meet criteria for preeclampsia. Patient states she is requesting labs for peace of mind.   Flexeril PO UA CBC, CMP  Preeclampsia labs within normal limits, no protein in urine. Normotensive while in MAU   After flexeril, patient states HA is still there and rating it 5/10  Percocet- patient reports some improvement  of HA but desires to follow up with primary OB in [place] tomorrow.  Assessment and Plan   1. Pregnancy headache in third trimester   2. [redacted] weeks gestation of pregnancy     -Discharge home in stable condition -Preeclampsia precautions discussed -Patient advised to follow-up with OB tomorrow  -Patient may return to MAU as needed or if her condition were to change or worsen  Wende Mott, CNM 11/30/2022, 10:53 PM

## 2022-11-30 NOTE — MAU Note (Signed)
Pt says she woke at 115pm- had H/A - feet and ankles swollen Took 2 XS Tyl at 130pm and again at 745pm- no relief.   Went to work at [employer]- saw spots , had nausea- none now, was sensitive to light . 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

## 2022-12-01 DIAGNOSIS — Z3A34 34 weeks gestation of pregnancy: Secondary | ICD-10-CM | POA: Diagnosis not present

## 2022-12-01 DIAGNOSIS — O26893 Other specified pregnancy related conditions, third trimester: Secondary | ICD-10-CM

## 2022-12-01 DIAGNOSIS — R519 Headache, unspecified: Secondary | ICD-10-CM | POA: Diagnosis not present

## 2022-12-01 LAB — COMPREHENSIVE METABOLIC PANEL
ALT: 14 U/L (ref 0–44)
AST: 23 U/L (ref 15–41)
Albumin: 2.8 g/dL — ABNORMAL LOW (ref 3.5–5.0)
Alkaline Phosphatase: 75 U/L (ref 38–126)
Anion gap: 10 (ref 5–15)
BUN: 6 mg/dL (ref 6–20)
CO2: 21 mmol/L — ABNORMAL LOW (ref 22–32)
Calcium: 8.9 mg/dL (ref 8.9–10.3)
Chloride: 103 mmol/L (ref 98–111)
Creatinine, Ser: 0.59 mg/dL (ref 0.44–1.00)
GFR, Estimated: >60 mL/min (ref >60)
Glucose, Bld: 71 mg/dL (ref 70–99)
Potassium: 3.9 mmol/L (ref 3.5–5.1)
Sodium: 134 mmol/L — ABNORMAL LOW (ref 135–145)
Total Bilirubin: 0.3 mg/dL (ref 0.3–1.2)
Total Protein: 6.2 g/dL — ABNORMAL LOW (ref 6.5–8.1)

## 2022-12-01 MED ORDER — OXYCODONE-ACETAMINOPHEN 5-325 MG PO TABS
2.0000 | ORAL_TABLET | Freq: Once | ORAL | Status: AC
  Administered 2022-12-01: 2 via ORAL
  Filled 2022-12-01: qty 2

## 2022-12-01 NOTE — Discharge Instructions (Signed)

## 2022-12-03 DIAGNOSIS — Z3A35 35 weeks gestation of pregnancy: Secondary | ICD-10-CM | POA: Diagnosis not present

## 2022-12-03 DIAGNOSIS — O26893 Other specified pregnancy related conditions, third trimester: Secondary | ICD-10-CM | POA: Diagnosis not present

## 2022-12-03 DIAGNOSIS — R519 Headache, unspecified: Secondary | ICD-10-CM | POA: Diagnosis not present

## 2022-12-03 DIAGNOSIS — O2943 Spinal and epidural anesthesia induced headache during pregnancy, third trimester: Secondary | ICD-10-CM | POA: Diagnosis not present

## 2022-12-04 ENCOUNTER — Other Ambulatory Visit: Payer: Self-pay

## 2022-12-04 ENCOUNTER — Encounter (HOSPITAL_COMMUNITY): Payer: Self-pay | Admitting: Obstetrics & Gynecology

## 2022-12-04 ENCOUNTER — Inpatient Hospital Stay (HOSPITAL_COMMUNITY)
Admission: AD | Admit: 2022-12-04 | Discharge: 2022-12-08 | DRG: 787 | Disposition: A | Discharge status: Home / Self Care | Payer: 59 | Attending: Obstetrics and Gynecology | Admitting: Obstetrics and Gynecology

## 2022-12-04 DIAGNOSIS — R519 Headache, unspecified: Secondary | ICD-10-CM | POA: Diagnosis not present

## 2022-12-04 DIAGNOSIS — A6000 Herpesviral infection of urogenital system, unspecified: Secondary | ICD-10-CM | POA: Diagnosis present

## 2022-12-04 DIAGNOSIS — Z3A35 35 weeks gestation of pregnancy: Secondary | ICD-10-CM | POA: Diagnosis not present

## 2022-12-04 DIAGNOSIS — B009 Herpesviral infection, unspecified: Secondary | ICD-10-CM | POA: Diagnosis present

## 2022-12-04 DIAGNOSIS — Z0289 Encounter for other administrative examinations: Secondary | ICD-10-CM

## 2022-12-04 DIAGNOSIS — O1414 Severe pre-eclampsia complicating childbirth: Secondary | ICD-10-CM | POA: Diagnosis not present

## 2022-12-04 DIAGNOSIS — D62 Acute posthemorrhagic anemia: Secondary | ICD-10-CM | POA: Diagnosis present

## 2022-12-04 DIAGNOSIS — O1413 Severe pre-eclampsia, third trimester: Secondary | ICD-10-CM | POA: Diagnosis not present

## 2022-12-04 DIAGNOSIS — Z98891 History of uterine scar from previous surgery: Secondary | ICD-10-CM

## 2022-12-04 DIAGNOSIS — Z30430 Encounter for insertion of intrauterine contraceptive device: Secondary | ICD-10-CM

## 2022-12-04 DIAGNOSIS — O9902 Anemia complicating childbirth: Secondary | ICD-10-CM | POA: Diagnosis present

## 2022-12-04 DIAGNOSIS — O9832 Other infections with a predominantly sexual mode of transmission complicating childbirth: Secondary | ICD-10-CM | POA: Diagnosis present

## 2022-12-04 LAB — COMPREHENSIVE METABOLIC PANEL
ALT: 14 U/L (ref 0–44)
AST: 25 U/L (ref 15–41)
Albumin: 2.9 g/dL — ABNORMAL LOW (ref 3.5–5.0)
Alkaline Phosphatase: 78 U/L (ref 38–126)
Anion gap: 11 (ref 5–15)
BUN: 6 mg/dL (ref 6–20)
CO2: 21 mmol/L — ABNORMAL LOW (ref 22–32)
Calcium: 9.3 mg/dL (ref 8.9–10.3)
Chloride: 101 mmol/L (ref 98–111)
Creatinine, Ser: 0.64 mg/dL (ref 0.44–1.00)
GFR, Estimated: >60 mL/min (ref >60)
Glucose, Bld: 77 mg/dL (ref 70–99)
Potassium: 3.8 mmol/L (ref 3.5–5.1)
Sodium: 133 mmol/L — ABNORMAL LOW (ref 135–145)
Total Bilirubin: 0.5 mg/dL (ref 0.3–1.2)
Total Protein: 6.4 g/dL — ABNORMAL LOW (ref 6.5–8.1)

## 2022-12-04 LAB — CBC
HCT: 35.6 % — ABNORMAL LOW (ref 36.0–46.0)
Hemoglobin: 11.4 g/dL — ABNORMAL LOW (ref 12.0–15.0)
MCH: 27.2 pg (ref 26.0–34.0)
MCHC: 32 g/dL (ref 30.0–36.0)
MCV: 85 fL (ref 80.0–100.0)
Platelets: 290 10*3/uL (ref 150–400)
RBC: 4.19 MIL/uL (ref 3.87–5.11)
RDW: 17.1 % — ABNORMAL HIGH (ref 11.5–15.5)
WBC: 10.8 10*3/uL — ABNORMAL HIGH (ref 4.0–10.5)
nRBC: 0 % (ref 0.0–0.2)

## 2022-12-04 LAB — PROTEIN / CREATININE RATIO, URINE
Creatinine, Urine: 64 mg/dL
Protein Creatinine Ratio: 0.16 mg/mg{Cre} — ABNORMAL HIGH (ref 0.00–0.15)
Total Protein, Urine: 10 mg/dL

## 2022-12-04 MED ORDER — PROMETHAZINE HCL 25 MG PO TABS
12.5000 mg | ORAL_TABLET | Freq: Once | ORAL | Status: AC
  Administered 2022-12-04: 12.5 mg via ORAL
  Filled 2022-12-04: qty 1

## 2022-12-04 MED ORDER — MAGNESIUM OXIDE -MG SUPPLEMENT 400 (240 MG) MG PO TABS: 400.0000 mg | ORAL_TABLET | Freq: Every day | ORAL | Status: DC

## 2022-12-04 MED ORDER — OXYCODONE HCL 5 MG PO TABS
5.0000 mg | ORAL_TABLET | Freq: Once | ORAL | Status: AC
  Administered 2022-12-04: 5 mg via ORAL
  Filled 2022-12-04: qty 1

## 2022-12-04 MED ORDER — CAFFEINE 200 MG PO TABS
200.0000 mg | ORAL_TABLET | Freq: Once | ORAL | Status: AC
  Administered 2022-12-04: 200 mg via ORAL
  Filled 2022-12-04: qty 1

## 2022-12-04 MED ORDER — MAGNESIUM OXIDE -MG SUPPLEMENT 400 (240 MG) MG PO TABS
400.0000 mg | ORAL_TABLET | Freq: Every day | ORAL | Status: DC
  Filled 2022-12-04: qty 1

## 2022-12-04 MED ORDER — CYCLOBENZAPRINE HCL 5 MG PO TABS
10.0000 mg | ORAL_TABLET | Freq: Once | ORAL | Status: AC
  Administered 2022-12-04: 10 mg via ORAL
  Filled 2022-12-04: qty 2

## 2022-12-04 NOTE — MAU Provider Note (Cosign Needed)
History     CSN: [id number]  Arrival date and time: 12/04/22 1901   Event Date/Time   First Provider Initiated Contact with Patient 12/04/22 2018      Chief Complaint  Patient presents with   Headache   HPI [name] is a 32 y.o. year old [id number] female at [redacted]w[redacted]d weeks gestation who presents to MAU reporting headache since Monday. She has tried Tylenol 1000 mg for the H/A; last dose @ 1700. She reports that gives only minimal relief. She reports that at her OB appointment yesterday, her BP was 140/83. She was sent to the hospital (in [place], [place]) for a PEC evaluation. She was not given any anti-HTN meds. When she called her OB's office today to report the on-going H/A, she was advised to take Tylenol 1000 mg, Benadryl and Reglan. She did not take the Benadryl and Reglan, because she was scheduled to work [employer]. She has since called out of work and decided to come here for evaluation. She reports BPs at home today were 149/98 and 144/93. She receives [facility] with [facility] in [place], [place]. She is scheduled for IOL @ 37 weeks.   OB History     Gravida  6   Para  4   Term  3   Preterm  1   AB  1   Living  4      SAB  1   IAB      Ectopic      Multiple  0   Live Births  4           Past Medical History:  Diagnosis Date   Anemia    Bipolar 1 disorder (HCC)    Compartment syndrome (HCC) 2006   Left leg   Depression    Fracture 2006   Left  leg requiring rods and screws   Headache    Hemorrhage    post partum hemorrhage   Infection    frequent UTI's   Pregnancy induced hypertension    Pyelonephritis during pregnancy 2021    Past Surgical History:  Procedure Laterality Date   CYST REMOVAL HAND     CYSTOLITHOTOMY     FRACTURE SURGERY Left    leg    Family History  Problem Relation Age of Onset   Birth defects Paternal Aunt        spina bifida   Hypertension Paternal Grandmother     Social History   Tobacco Use    Smoking status: Never   Smokeless tobacco: Never  Vaping Use   Vaping Use: Never used  Substance Use Topics   Alcohol use: No    Comment: occasional,, not now   Drug use: No    Allergies: No Known Allergies  Medications Prior to Admission  Medication Sig Dispense Refill Last Dose   acetaminophen (TYLENOL) 325 MG tablet Take 2 tablets (650 mg total) by mouth every 4 (four) hours as needed (for pain scale < 4).   12/04/2022 at 1700   aspirin EC 81 MG tablet Take 81 mg by mouth daily. Swallow whole.   12/04/2022   ferrous sulfate 325 (65 FE) MG EC tablet Take 325 mg by mouth 3 (three) times daily with meals.   12/04/2022   ondansetron (ZOFRAN) 4 MG tablet Take 1 tablet (4 mg total) by mouth every 8 (eight) hours as needed for nausea or vomiting. 20 tablet 0 Past Month   Prenatal Vit-Fe Fumarate-FA (PRENATAL MULTIVITAMIN) TABS tablet Take  1 tablet by mouth daily at 12 noon.   12/04/2022   sertraline (ZOLOFT) 25 MG tablet Take 25 mg by mouth daily.   12/04/2022    Review of Systems  Constitutional: Negative.   Eyes:  Positive for photophobia.  Respiratory: Negative.    Cardiovascular: Negative.   Gastrointestinal: Negative.   Endocrine: Negative.   Musculoskeletal: Negative.   Skin: Negative.   Allergic/Immunologic: Negative.   Neurological:  Positive for headaches (since Monday; Tylenol hleps minimally, "but not for long").  Hematological: Negative.   Psychiatric/Behavioral: Negative.     Physical Exam   Patient Vitals for the past 24 hrs:  BP Temp Temp src Pulse Resp Height Weight  12/04/22 2332 (!) 140/74 -- -- 99 -- -- --  12/04/22 2315 (!) 152/79 -- -- 96 -- -- --  12/04/22 2300 (!) 143/89 -- -- 91 -- -- --  12/04/22 2245 (!) 151/87 -- -- 93 -- -- --  12/04/22 2230 (!) 145/88 -- -- 96 -- -- --  12/04/22 2216 (!) 162/92 -- -- 90 -- -- --  12/04/22 2200 (!) 153/84 -- -- 89 -- -- --  12/04/22 2145 139/88 -- -- 83 -- -- --  12/04/22 2130 133/75 -- -- 85 -- -- --  12/04/22 2115  133/78 -- -- 78 -- -- --  12/04/22 2100 125/72 -- -- 85 -- -- --  12/04/22 2045 130/79 -- -- 92 -- -- --  12/04/22 2030 123/64 -- -- 82 -- -- --  12/04/22 2015 135/65 -- -- 82 -- -- --  12/04/22 2001 132/78 -- -- 88 -- -- --  12/04/22 2000 131/74 -- -- 86 -- -- --  12/04/22 1951 133/76 -- -- 89 -- -- --  12/04/22 1923 128/62 98.8 F (37.1 C) Oral 89 18 5\' 3"  (1.6 m) 106.9 kg    Physical Exam Vitals and nursing note reviewed.  Constitutional:      Appearance: Normal appearance. She is obese.  HENT:     Head: Normocephalic and atraumatic.  Cardiovascular:     Rate and Rhythm: Normal rate.  Pulmonary:     Effort: Pulmonary effort is normal.  Musculoskeletal:        General: Normal range of motion.  Skin:    General: Skin is warm and dry.  Neurological:     Mental Status: She is alert and oriented to person, place, and time.  Psychiatric:        Mood and Affect: Mood normal.        Behavior: Behavior normal.        Thought Content: Thought content normal.        Judgment: Judgment normal.    REACTIVE NST - FHR: 140 bpm / moderate variability / accels present / decels absent / TOCO: Occ UC with UI noted  MAU Course  Procedures  MDM CCUA CBC CMP P/C Ratio Serial BP's  Caffeine 200 mg po -- H/A unchanged after 1 hour Flexeril 10 mg po --   Results for orders placed or performed during the hospital encounter of 12/04/22 (from the past 24 hour(s))  Protein / creatinine ratio, urine     Status: Abnormal   Collection Time: 12/04/22  7:40 PM  Result Value Ref Range   Creatinine, Urine 64 mg/dL   Total Protein, Urine 10 mg/dL   Protein Creatinine Ratio 0.16 (H) 0.00 - 0.15 mg/mg[Cre]  CBC     Status: Abnormal   Collection Time: 12/04/22  8:25 PM  Result Value Ref  Range   WBC 10.8 (H) 4.0 - 10.5 K/uL   RBC 4.19 3.87 - 5.11 MIL/uL   Hemoglobin 11.4 (L) 12.0 - 15.0 g/dL   HCT 35.6 (L) 36.0 - 46.0 %   MCV 85.0 80.0 - 100.0 fL   MCH 27.2 26.0 - 34.0 pg   MCHC 32.0 30.0 -  36.0 g/dL   RDW 17.1 (H) 11.5 - 15.5 %   Platelets 290 150 - 400 K/uL   nRBC 0.0 0.0 - 0.2 %  Comprehensive metabolic panel     Status: Abnormal   Collection Time: 12/04/22  8:25 PM  Result Value Ref Range   Sodium 133 (L) 135 - 145 mmol/L   Potassium 3.8 3.5 - 5.1 mmol/L   Chloride 101 98 - 111 mmol/L   CO2 21 (L) 22 - 32 mmol/L   Glucose, Bld 77 70 - 99 mg/dL   BUN 6 6 - 20 mg/dL   Creatinine, Ser 0.64 0.44 - 1.00 mg/dL   Calcium 9.3 8.9 - 10.3 mg/dL   Total Protein 6.4 (L) 6.5 - 8.1 g/dL   Albumin 2.9 (L) 3.5 - 5.0 g/dL   AST 25 15 - 41 U/L   ALT 14 0 - 44 U/L   Alkaline Phosphatase 78 38 - 126 U/L   Total Bilirubin 0.5 0.3 - 1.2 mg/dL   GFR, Estimated >60 >60 mL/min   Anion gap 11 5 - 15     Report given to Fatima Blank, CNM @ [address]  [address], [place] 12/04/2022, 8:18 PM   MDM:  BP elevated and headache not resolved with Tylenol, caffeine, and oxycodone.  Pt reports HSV outbreak starting 4-5 days ago that is not fully healed.  Consult Dr Harolyn Rutherford with assessment and findings.  Admit to [facility] for delivery for [facility] with severe features by cesarean for HSV outbreak.    Assessment and Plan   1. Severe preeclampsia, third trimester   2. [redacted] weeks gestation of pregnancy      Admit to [place].  Fatima Blank, CNM 12:04 AM

## 2022-12-04 NOTE — MAU Note (Signed)
Pt says she has had H/A since Monday- was here. Here - gave meds- Went to [place]  - Thursday- BP- 140/83 From office went to [facility]- labs, meds.  Plan- Induction at 37 weeks . Did not put on BP meds-  For home- for H/A-  XS Tyl 2 tab - took 5pm. Was also Reglan and Bendryl- did not take - bc would make her sleepy  and was suppose to work- but she called out - L/D  upstairs

## 2022-12-04 NOTE — H&P (Incomplete)
OBSTETRIC ADMISSION HISTORY AND PHYSICAL  Peggy Mckenzie is a 32 y.o. female [phone] with IUP at [redacted]w[redacted]d by *** presenting for ***. She reports +FMs, No LOF, no VB, no blurry vision, headaches or peripheral edema, and RUQ pain.  She plans on *** feeding. She request *** for birth control. She received her prenatal care at {Blank single:19197::"[facility]"}   Dating: By *** --->  Estimated Date of Delivery: 01/06/23  Sono:    @***w***d, CWD, normal anatomy, *** presentation, *** lie, ***g, ***% EFW   Prenatal History/Complications: ***  Past Medical History: Past Medical History:  Diagnosis Date  . Anemia   . Bipolar 1 disorder (HCC)   . Compartment syndrome (HCC) 2006   Left leg  . Depression   . Fracture 2006   Left  leg requiring rods and screws  . Headache   . Hemorrhage    post partum hemorrhage  . Infection    frequent UTI's  . Pregnancy induced hypertension   . Pyelonephritis during pregnancy 2021    Past Surgical History: Past Surgical History:  Procedure Laterality Date  . CYST REMOVAL HAND    . CYSTOLITHOTOMY    . FRACTURE SURGERY Left    leg    Obstetrical History: OB History     Gravida  6   Para  4   Term  3   Preterm  1   AB  1   Living  4      SAB  1   IAB      Ectopic      Multiple  0   Live Births  4           Social History Social History   Socioeconomic History  . Marital status: Married    Spouse name: Not on file  . Number of children: Not on file  . Years of education: Not on file  . Highest education level: Not on file  Occupational History  . Not on file  Tobacco Use  . Smoking status: Never  . Smokeless tobacco: Never  Vaping Use  . Vaping Use: Never used  Substance and Sexual Activity  . Alcohol use: No    Comment: occasional,, not now  . Drug use: No  . Sexual activity: Yes    Birth control/protection: None  Other Topics Concern  . Not on file  Social History Narrative  .  Not on file   Social Determinants of Health   Financial Resource Strain: Not on file  Food Insecurity: Not on file  Transportation Needs: Not on file  Physical Activity: Not on file  Stress: Not on file  Social Connections: Not on file    Family History: Family History  Problem Relation Age of Onset  . Birth defects Paternal Aunt        spina bifida  . Hypertension Paternal Grandmother     Allergies: No Known Allergies  Medications Prior to Admission  Medication Sig Dispense Refill Last Dose  . acetaminophen (TYLENOL) 325 MG tablet Take 2 tablets (650 mg total) by mouth every 4 (four) hours as needed (for pain scale < 4).   12/04/2022 at 1700  . aspirin EC 81 MG tablet Take 81 mg by mouth daily. Swallow whole.   12/04/2022  . ferrous sulfate 325 (65 FE) MG EC tablet Take 325 mg by mouth 3 (three) times daily with meals.   12/04/2022  . ondansetron (ZOFRAN) 4 MG tablet Take 1 tablet (4 mg total) by  mouth every 8 (eight) hours as needed for nausea or vomiting. 20 tablet 0 Past Month  . Prenatal Vit-Fe Fumarate-FA (PRENATAL MULTIVITAMIN) TABS tablet Take 1 tablet by mouth daily at 12 noon.   12/04/2022  . sertraline (ZOLOFT) 25 MG tablet Take 25 mg by mouth daily.   12/04/2022     Review of Systems   All systems reviewed and negative except as stated in HPI  Blood pressure (!) 140/74, pulse 99, temperature 98.8 F (37.1 C), temperature source Oral, resp. rate 18, height 5\' 3"  (1.6 m), weight 106.9 kg, currently breastfeeding. General appearance: {general exam:16600} Lungs: clear to auscultation bilaterally Heart: regular rate and rhythm Abdomen: soft, non-tender; bowel sounds normal Pelvic: *** Extremities: Homans sign is negative, no sign of DVT DTR's *** Presentation: {desc; fetal presentation:14558} Fetal monitoring{findings; monitor fetal heart monitor:31527} Uterine activity{Uterine contractions:31516}     Prenatal labs: ABO, Rh:   Antibody:   Rubella:   RPR:     HBsAg:    HIV:    GBS:    1 hr Glucola *** Genetic screening  *** Anatomy US ***  Prenatal Transfer Tool  Maternal Diabetes: {Maternal Diabetes:3043596} Genetic Screening: {Genetic Screening:20205} Maternal Ultrasounds/Referrals: {Maternal Ultrasounds / Referrals:20211} Fetal Ultrasounds or other Referrals:  {Fetal Ultrasounds or Other Referrals:20213} Maternal Substance Abuse:  {Maternal Substance Abuse:20223} Significant Maternal Medications:  {Significant Maternal Meds:20233} Significant Maternal Lab Results:  {Significant Maternal Lab Results:20235} Number of Prenatal Visits:{Prenatal Visits:27860} Other Comments:  {Other Comments:20251}  Results for orders placed or performed during the hospital encounter of 12/04/22 (from the past 24 hour(s))  Protein / creatinine ratio, urine   Collection Time: 12/04/22  7:40 PM  Result Value Ref Range   Creatinine, Urine 64 mg/dL   Total Protein, Urine 10 mg/dL   Protein Creatinine Ratio 0.16 (H) 0.00 - 0.15 mg/mg[Cre]  CBC   Collection Time: 12/04/22  8:25 PM  Result Value Ref Range   WBC 10.8 (H) 4.0 - 10.5 K/uL   RBC 4.19 3.87 - 5.11 MIL/uL   Hemoglobin 11.4 (L) 12.0 - 15.0 g/dL   HCT 35.6 (L) 36.0 - 46.0 %   MCV 85.0 80.0 - 100.0 fL   MCH 27.2 26.0 - 34.0 pg   MCHC 32.0 30.0 - 36.0 g/dL   RDW 17.1 (H) 11.5 - 15.5 %   Platelets 290 150 - 400 K/uL   nRBC 0.0 0.0 - 0.2 %  Comprehensive metabolic panel   Collection Time: 12/04/22  8:25 PM  Result Value Ref Range   Sodium 133 (L) 135 - 145 mmol/L   Potassium 3.8 3.5 - 5.1 mmol/L   Chloride 101 98 - 111 mmol/L   CO2 21 (L) 22 - 32 mmol/L   Glucose, Bld 77 70 - 99 mg/dL   BUN 6 6 - 20 mg/dL   Creatinine, Ser 0.64 0.44 - 1.00 mg/dL   Calcium 9.3 8.9 - 10.3 mg/dL   Total Protein 6.4 (L) 6.5 - 8.1 g/dL   Albumin 2.9 (L) 3.5 - 5.0 g/dL   AST 25 15 - 41 U/L   ALT 14 0 - 44 U/L   Alkaline Phosphatase 78 38 - 126 U/L   Total Bilirubin 0.5 0.3 - 1.2 mg/dL   GFR, Estimated >60 >60  mL/min   Anion gap 11 5 - 15    Patient Active Problem List   Diagnosis Date Noted  . Bipolar depression ([place]) 05/11/2021  . HSV infection 05/11/2021  . Preeclampsia, severe, third trimester 05/11/2021  . Gestational  hypertension 05/08/2021    Assessment/Plan:  KOURTNEY TERRIQUEZ is a 32 y.o. [id number] at [redacted]w[redacted]d here for***  #Labor:*** #Pain: *** #FWB: *** #ID:  *** #MOF: *** #MOC:*** #Circ:  ***  Abram Sander, MD  12/04/2022, 11:54 PM

## 2022-12-05 ENCOUNTER — Other Ambulatory Visit: Payer: Self-pay

## 2022-12-05 ENCOUNTER — Encounter (HOSPITAL_COMMUNITY)
Admission: AD | Disposition: A | Discharge status: Home / Self Care | Payer: Self-pay | Source: Home / Self Care | Attending: Obstetrics & Gynecology

## 2022-12-05 ENCOUNTER — Inpatient Hospital Stay (HOSPITAL_COMMUNITY): Payer: 59 | Admitting: Certified Registered"

## 2022-12-05 ENCOUNTER — Encounter (HOSPITAL_COMMUNITY): Payer: Self-pay | Admitting: Obstetrics & Gynecology

## 2022-12-05 DIAGNOSIS — O9852 Other viral diseases complicating childbirth: Secondary | ICD-10-CM

## 2022-12-05 DIAGNOSIS — Z3A35 35 weeks gestation of pregnancy: Secondary | ICD-10-CM

## 2022-12-05 DIAGNOSIS — Z98891 History of uterine scar from previous surgery: Secondary | ICD-10-CM

## 2022-12-05 DIAGNOSIS — Z30430 Encounter for insertion of intrauterine contraceptive device: Secondary | ICD-10-CM | POA: Diagnosis not present

## 2022-12-05 DIAGNOSIS — R519 Headache, unspecified: Secondary | ICD-10-CM | POA: Diagnosis present

## 2022-12-05 DIAGNOSIS — Z0289 Encounter for other administrative examinations: Secondary | ICD-10-CM

## 2022-12-05 DIAGNOSIS — O1413 Severe pre-eclampsia, third trimester: Secondary | ICD-10-CM | POA: Diagnosis not present

## 2022-12-05 DIAGNOSIS — O9902 Anemia complicating childbirth: Secondary | ICD-10-CM | POA: Diagnosis not present

## 2022-12-05 DIAGNOSIS — O1414 Severe pre-eclampsia complicating childbirth: Secondary | ICD-10-CM | POA: Diagnosis not present

## 2022-12-05 DIAGNOSIS — B009 Herpesviral infection, unspecified: Secondary | ICD-10-CM | POA: Diagnosis not present

## 2022-12-05 DIAGNOSIS — O9832 Other infections with a predominantly sexual mode of transmission complicating childbirth: Secondary | ICD-10-CM | POA: Diagnosis not present

## 2022-12-05 DIAGNOSIS — A6000 Herpesviral infection of urogenital system, unspecified: Secondary | ICD-10-CM | POA: Diagnosis not present

## 2022-12-05 DIAGNOSIS — D62 Acute posthemorrhagic anemia: Secondary | ICD-10-CM | POA: Diagnosis not present

## 2022-12-05 HISTORY — PX: INTRAUTERINE DEVICE (IUD) INSERTION: SHX5877

## 2022-12-05 LAB — TYPE AND SCREEN
ABO/RH(D): O POS
Antibody Screen: NEGATIVE

## 2022-12-05 LAB — RPR: RPR Ser Ql: NONREACTIVE

## 2022-12-05 SURGERY — Surgical Case
Anesthesia: Spinal

## 2022-12-05 MED ORDER — ENOXAPARIN SODIUM 60 MG/0.6ML IJ SOSY
50.0000 mg | PREFILLED_SYRINGE | INTRAMUSCULAR | Status: DC
  Administered 2022-12-05 – 2022-12-07 (×3): 50 mg via SUBCUTANEOUS
  Filled 2022-12-05 (×3): qty 0.6

## 2022-12-05 MED ORDER — POVIDONE-IODINE 10 % EX SWAB
2.0000 | Freq: Once | CUTANEOUS | Status: AC
  Administered 2022-12-05: 2 via TOPICAL

## 2022-12-05 MED ORDER — SCOPOLAMINE 1 MG/3DAYS TD PT72
MEDICATED_PATCH | TRANSDERMAL | Status: DC | PRN
  Administered 2022-12-05: 1 via TRANSDERMAL

## 2022-12-05 MED ORDER — NALOXONE HCL 0.4 MG/ML IJ SOLN: 0.4000 mg | INTRAMUSCULAR | Status: DC | PRN

## 2022-12-05 MED ORDER — LACTATED RINGERS IV SOLN: INTRAVENOUS | Status: DC

## 2022-12-05 MED ORDER — MENTHOL 3 MG MT LOZG: 1.0000 | LOZENGE | OROMUCOSAL | Status: DC | PRN

## 2022-12-05 MED ORDER — SCOPOLAMINE 1 MG/3DAYS TD PT72: 1.0000 | MEDICATED_PATCH | Freq: Once | TRANSDERMAL | Status: DC

## 2022-12-05 MED ORDER — DIPHENHYDRAMINE HCL 25 MG PO CAPS
25.0000 mg | ORAL_CAPSULE | Freq: Four times a day (QID) | ORAL | Status: DC | PRN
  Filled 2022-12-05: qty 1

## 2022-12-05 MED ORDER — MORPHINE SULFATE (PF) 0.5 MG/ML IJ SOLN
INTRAMUSCULAR | Status: AC
  Filled 2022-12-05: qty 10

## 2022-12-05 MED ORDER — LABETALOL HCL 5 MG/ML IV SOLN: 20.0000 mg | INTRAVENOUS | Status: DC | PRN

## 2022-12-05 MED ORDER — SERTRALINE HCL 50 MG PO TABS
25.0000 mg | ORAL_TABLET | Freq: Every day | ORAL | Status: DC
  Administered 2022-12-05 – 2022-12-08 (×4): 25 mg via ORAL
  Filled 2022-12-05 (×4): qty 1

## 2022-12-05 MED ORDER — MEPERIDINE HCL 25 MG/ML IJ SOLN: 6.2500 mg | INTRAMUSCULAR | Status: DC | PRN

## 2022-12-05 MED ORDER — NALBUPHINE HCL 10 MG/ML IJ SOLN
5.0000 mg | INTRAMUSCULAR | Status: DC | PRN
  Administered 2022-12-05: 10 mg via INTRAVENOUS
  Administered 2022-12-05: 5 mg via INTRAVENOUS
  Filled 2022-12-05 (×3): qty 1

## 2022-12-05 MED ORDER — SODIUM CHLORIDE 0.9% FLUSH: 3.0000 mL | INTRAVENOUS | Status: DC | PRN

## 2022-12-05 MED ORDER — MAGNESIUM HYDROXIDE 400 MG/5ML PO SUSP: 30.0000 mL | ORAL | Status: DC | PRN

## 2022-12-05 MED ORDER — BUPIVACAINE IN DEXTROSE 0.75-8.25 % IT SOLN
INTRATHECAL | Status: DC | PRN
  Administered 2022-12-05: 1.5 mL via INTRATHECAL

## 2022-12-05 MED ORDER — OXYTOCIN-SODIUM CHLORIDE 30-0.9 UT/500ML-% IV SOLN
INTRAVENOUS | Status: AC
  Filled 2022-12-05: qty 500

## 2022-12-05 MED ORDER — DIPHENHYDRAMINE HCL 50 MG/ML IJ SOLN
INTRAMUSCULAR | Status: AC
  Filled 2022-12-05: qty 1

## 2022-12-05 MED ORDER — ACETAMINOPHEN 500 MG PO TABS
1000.0000 mg | ORAL_TABLET | Freq: Four times a day (QID) | ORAL | Status: DC
  Administered 2022-12-05 – 2022-12-08 (×13): 1000 mg via ORAL
  Filled 2022-12-05 (×13): qty 2

## 2022-12-05 MED ORDER — MEASLES, MUMPS & RUBELLA VAC IJ SOLR: 0.5000 mL | Freq: Once | INTRAMUSCULAR | Status: DC

## 2022-12-05 MED ORDER — MAGNESIUM SULFATE 40 GM/1000ML IV SOLN
2.0000 g/h | INTRAVENOUS | Status: AC
  Administered 2022-12-05: 2 g/h via INTRAVENOUS
  Filled 2022-12-05: qty 1000

## 2022-12-05 MED ORDER — COCONUT OIL OIL: 1.0000 | TOPICAL_OIL | Status: DC | PRN

## 2022-12-05 MED ORDER — SOD CITRATE-CITRIC ACID 500-334 MG/5ML PO SOLN
ORAL | Status: AC
  Filled 2022-12-05: qty 30

## 2022-12-05 MED ORDER — FERROUS SULFATE 325 (65 FE) MG PO TABS
325.0000 mg | ORAL_TABLET | ORAL | Status: DC
  Administered 2022-12-06 – 2022-12-08 (×2): 325 mg via ORAL
  Filled 2022-12-05 (×2): qty 1

## 2022-12-05 MED ORDER — DIBUCAINE (PERIANAL) 1 % EX OINT: 1.0000 | TOPICAL_OINTMENT | CUTANEOUS | Status: DC | PRN

## 2022-12-05 MED ORDER — HYDRALAZINE HCL 20 MG/ML IJ SOLN: 10.0000 mg | INTRAMUSCULAR | Status: DC | PRN

## 2022-12-05 MED ORDER — OXYCODONE-ACETAMINOPHEN 5-325 MG PO TABS: 2.0000 | ORAL_TABLET | ORAL | Status: DC | PRN

## 2022-12-05 MED ORDER — TETANUS-DIPHTH-ACELL PERTUSSIS 5-2.5-18.5 LF-MCG/0.5 IM SUSY: 0.5000 mL | PREFILLED_SYRINGE | Freq: Once | INTRAMUSCULAR | Status: DC

## 2022-12-05 MED ORDER — KETOROLAC TROMETHAMINE 30 MG/ML IJ SOLN
30.0000 mg | Freq: Four times a day (QID) | INTRAMUSCULAR | Status: AC
  Administered 2022-12-05 – 2022-12-06 (×3): 30 mg via INTRAVENOUS
  Filled 2022-12-05 (×3): qty 1

## 2022-12-05 MED ORDER — DIPHENHYDRAMINE HCL 50 MG/ML IJ SOLN
12.5000 mg | INTRAMUSCULAR | Status: DC | PRN
  Administered 2022-12-05 (×2): 12.5 mg via INTRAVENOUS

## 2022-12-05 MED ORDER — LABETALOL HCL 5 MG/ML IV SOLN: 80.0000 mg | INTRAVENOUS | Status: DC | PRN

## 2022-12-05 MED ORDER — SIMETHICONE 80 MG PO CHEW: 80.0000 mg | CHEWABLE_TABLET | ORAL | Status: DC | PRN

## 2022-12-05 MED ORDER — OXYTOCIN-SODIUM CHLORIDE 30-0.9 UT/500ML-% IV SOLN
INTRAVENOUS | Status: DC | PRN
  Administered 2022-12-05: 30 [IU] via INTRAVENOUS

## 2022-12-05 MED ORDER — DEXAMETHASONE SODIUM PHOSPHATE 10 MG/ML IJ SOLN
INTRAMUSCULAR | Status: DC | PRN
  Administered 2022-12-05: 10 mg via INTRAVENOUS

## 2022-12-05 MED ORDER — FENTANYL CITRATE (PF) 100 MCG/2ML IJ SOLN
INTRAMUSCULAR | Status: DC | PRN
  Administered 2022-12-05: 15 ug via INTRATHECAL

## 2022-12-05 MED ORDER — OXYCODONE HCL 5 MG PO TABS
5.0000 mg | ORAL_TABLET | ORAL | Status: DC | PRN
  Administered 2022-12-06: 5 mg via ORAL
  Administered 2022-12-06 (×2): 10 mg via ORAL
  Administered 2022-12-07: 5 mg via ORAL
  Filled 2022-12-05 (×2): qty 2
  Filled 2022-12-05 (×2): qty 1

## 2022-12-05 MED ORDER — PHENYLEPHRINE HCL-NACL 20-0.9 MG/250ML-% IV SOLN
INTRAVENOUS | Status: DC | PRN
  Administered 2022-12-05: 60 ug/min via INTRAVENOUS

## 2022-12-05 MED ORDER — ONDANSETRON HCL 4 MG/2ML IJ SOLN
INTRAMUSCULAR | Status: DC | PRN
  Administered 2022-12-05: 4 mg via INTRAVENOUS

## 2022-12-05 MED ORDER — PARAGARD INTRAUTERINE COPPER IU IUD
INTRAUTERINE_SYSTEM | INTRAUTERINE | Status: AC
  Filled 2022-12-05: qty 1

## 2022-12-05 MED ORDER — KETOROLAC TROMETHAMINE 30 MG/ML IJ SOLN
INTRAMUSCULAR | Status: AC
  Filled 2022-12-05: qty 1

## 2022-12-05 MED ORDER — DIPHENHYDRAMINE HCL 25 MG PO CAPS
25.0000 mg | ORAL_CAPSULE | ORAL | Status: DC | PRN
  Administered 2022-12-05 (×3): 25 mg via ORAL
  Filled 2022-12-05 (×2): qty 1

## 2022-12-05 MED ORDER — SODIUM CHLORIDE 0.9 % IV SOLN: INTRAVENOUS | Status: DC | PRN

## 2022-12-05 MED ORDER — CEFAZOLIN SODIUM-DEXTROSE 2-4 GM/100ML-% IV SOLN
2.0000 g | INTRAVENOUS | Status: AC
  Administered 2022-12-05: 2 g via INTRAVENOUS
  Filled 2022-12-05: qty 100

## 2022-12-05 MED ORDER — NALOXONE HCL 4 MG/10ML IJ SOLN: 1.0000 ug/kg/h | INTRAVENOUS | Status: DC | PRN

## 2022-12-05 MED ORDER — GABAPENTIN 300 MG PO CAPS
300.0000 mg | ORAL_CAPSULE | Freq: Two times a day (BID) | ORAL | Status: DC
  Administered 2022-12-05 – 2022-12-08 (×7): 300 mg via ORAL
  Filled 2022-12-05 (×7): qty 1

## 2022-12-05 MED ORDER — LEVONORGESTREL 20 MCG/DAY IU IUD
INTRAUTERINE_SYSTEM | INTRAUTERINE | Status: AC
  Filled 2022-12-05: qty 1

## 2022-12-05 MED ORDER — WITCH HAZEL-GLYCERIN EX PADS: 1.0000 | MEDICATED_PAD | CUTANEOUS | Status: DC | PRN

## 2022-12-05 MED ORDER — MAGNESIUM SULFATE 40 GM/1000ML IV SOLN
2.0000 g/h | INTRAVENOUS | Status: DC
  Administered 2022-12-05: 2 g/h via INTRAVENOUS
  Filled 2022-12-05: qty 1000

## 2022-12-05 MED ORDER — LABETALOL HCL 5 MG/ML IV SOLN: 40.0000 mg | INTRAVENOUS | Status: DC | PRN

## 2022-12-05 MED ORDER — HYDROMORPHONE HCL 1 MG/ML IJ SOLN: 1.0000 mg | INTRAMUSCULAR | Status: DC | PRN

## 2022-12-05 MED ORDER — PARAGARD INTRAUTERINE COPPER IU IUD: 1.0000 | INTRAUTERINE_SYSTEM | INTRAUTERINE | Status: DC

## 2022-12-05 MED ORDER — OXYTOCIN-SODIUM CHLORIDE 30-0.9 UT/500ML-% IV SOLN: 2.5000 [IU]/h | INTRAVENOUS | Status: AC

## 2022-12-05 MED ORDER — MAGNESIUM SULFATE BOLUS VIA INFUSION
4.0000 g | Freq: Once | INTRAVENOUS | Status: AC
  Administered 2022-12-05: 4 g via INTRAVENOUS
  Filled 2022-12-05: qty 1000

## 2022-12-05 MED ORDER — IBUPROFEN 600 MG PO TABS
600.0000 mg | ORAL_TABLET | Freq: Four times a day (QID) | ORAL | Status: DC
  Administered 2022-12-06 – 2022-12-08 (×9): 600 mg via ORAL
  Filled 2022-12-05 (×9): qty 1

## 2022-12-05 MED ORDER — FENTANYL CITRATE (PF) 100 MCG/2ML IJ SOLN
INTRAMUSCULAR | Status: AC
  Filled 2022-12-05: qty 2

## 2022-12-05 MED ORDER — MORPHINE SULFATE (PF) 0.5 MG/ML IJ SOLN
INTRAMUSCULAR | Status: DC | PRN
  Administered 2022-12-05: 150 ug via INTRATHECAL

## 2022-12-05 MED ORDER — TRANEXAMIC ACID-NACL 1000-0.7 MG/100ML-% IV SOLN
1000.0000 mg | INTRAVENOUS | Status: AC
  Administered 2022-12-05: 1000 mg via INTRAVENOUS

## 2022-12-05 MED ORDER — LEVONORGESTREL 20 MCG/DAY IU IUD
1.0000 | INTRAUTERINE_SYSTEM | Freq: Once | INTRAUTERINE | Status: AC
  Administered 2022-12-05: 1 via INTRAUTERINE

## 2022-12-05 MED ORDER — STERILE WATER FOR IRRIGATION IR SOLN
Status: DC | PRN
  Administered 2022-12-05: 1

## 2022-12-05 MED ORDER — TRANEXAMIC ACID-NACL 1000-0.7 MG/100ML-% IV SOLN
INTRAVENOUS | Status: AC
  Filled 2022-12-05: qty 100

## 2022-12-05 MED ORDER — EPHEDRINE SULFATE (PRESSORS) 50 MG/ML IJ SOLN
INTRAMUSCULAR | Status: DC | PRN
  Administered 2022-12-05: 5 mg via INTRAVENOUS

## 2022-12-05 MED ORDER — DEXAMETHASONE SODIUM PHOSPHATE 10 MG/ML IJ SOLN
10.0000 mg | Freq: Once | INTRAMUSCULAR | Status: AC
  Administered 2022-12-05: 10 mg via INTRAVENOUS
  Filled 2022-12-05: qty 1

## 2022-12-05 MED ORDER — PRENATAL MULTIVITAMIN CH
1.0000 | ORAL_TABLET | Freq: Every day | ORAL | Status: DC
  Administered 2022-12-05 – 2022-12-08 (×4): 1 via ORAL
  Filled 2022-12-05 (×4): qty 1

## 2022-12-05 MED ORDER — ZOLPIDEM TARTRATE 5 MG PO TABS: 5.0000 mg | ORAL_TABLET | Freq: Every evening | ORAL | Status: DC | PRN

## 2022-12-05 MED ORDER — DEXAMETHASONE SODIUM PHOSPHATE 4 MG/ML IJ SOLN
INTRAMUSCULAR | Status: AC
  Filled 2022-12-05: qty 3

## 2022-12-05 MED ORDER — SCOPOLAMINE 1 MG/3DAYS TD PT72
MEDICATED_PATCH | TRANSDERMAL | Status: AC
  Filled 2022-12-05: qty 1

## 2022-12-05 MED ORDER — NALBUPHINE HCL 10 MG/ML IJ SOLN: 5.0000 mg | INTRAMUSCULAR | Status: DC | PRN

## 2022-12-05 MED ORDER — SENNOSIDES-DOCUSATE SODIUM 8.6-50 MG PO TABS
2.0000 | ORAL_TABLET | Freq: Every day | ORAL | Status: DC
  Administered 2022-12-06 – 2022-12-08 (×3): 2 via ORAL
  Filled 2022-12-05 (×3): qty 2

## 2022-12-05 MED ORDER — ONDANSETRON HCL 4 MG/2ML IJ SOLN: 4.0000 mg | Freq: Three times a day (TID) | INTRAMUSCULAR | Status: DC | PRN

## 2022-12-05 MED ORDER — KETOROLAC TROMETHAMINE 30 MG/ML IJ SOLN
30.0000 mg | Freq: Once | INTRAMUSCULAR | Status: AC | PRN
  Administered 2022-12-05: 30 mg via INTRAVENOUS

## 2022-12-05 SURGICAL SUPPLY — 36 items
APL PRP STRL LF DISP 70% ISPRP (MISCELLANEOUS) ×4
APL SKNCLS STERI-STRIP NONHPOA (GAUZE/BANDAGES/DRESSINGS) ×2
BENZOIN TINCTURE PRP APPL 2/3 (GAUZE/BANDAGES/DRESSINGS) IMPLANT
CHLORAPREP W/TINT 26 (MISCELLANEOUS) ×4 IMPLANT
CLAMP UMBILICAL CORD (MISCELLANEOUS) ×2 IMPLANT
CLOTH BEACON ORANGE TIMEOUT ST (SAFETY) ×2 IMPLANT
DRSG OPSITE POSTOP 4X10 (GAUZE/BANDAGES/DRESSINGS) ×2 IMPLANT
ELECT REM PT RETURN 9FT ADLT (ELECTROSURGICAL) ×2
ELECTRODE REM PT RTRN 9FT ADLT (ELECTROSURGICAL) ×2 IMPLANT
EXTRACTOR VACUUM M CUP 4 TUBE (SUCTIONS) IMPLANT
GAUZE SPONGE 4X4 12PLY STRL LF (GAUZE/BANDAGES/DRESSINGS) IMPLANT
GLOVE BIOGEL PI IND STRL 7.0 (GLOVE) ×6 IMPLANT
GLOVE ECLIPSE 7.0 STRL STRAW (GLOVE) ×2 IMPLANT
GOWN STRL REUS W/TWL LRG LVL3 (GOWN DISPOSABLE) ×4 IMPLANT
KIT ABG SYR 3ML LUER SLIP (SYRINGE) IMPLANT
MAT PREVALON FULL STRYKER (MISCELLANEOUS) IMPLANT
NDL HYPO 25X5/8 SAFETYGLIDE (NEEDLE) ×2 IMPLANT
NEEDLE HYPO 22GX1.5 SAFETY (NEEDLE) ×2 IMPLANT
NEEDLE HYPO 25X5/8 SAFETYGLIDE (NEEDLE) ×2 IMPLANT
NS IRRIG 1000ML POUR BTL (IV SOLUTION) ×2 IMPLANT
PACK C SECTION WH (CUSTOM PROCEDURE TRAY) ×2 IMPLANT
PAD ABD 7.5X8 STRL (GAUZE/BANDAGES/DRESSINGS) ×2 IMPLANT
PAD OB MATERNITY 4.3X12.25 (PERSONAL CARE ITEMS) ×2 IMPLANT
RTRCTR C-SECT PINK 25CM LRG (MISCELLANEOUS) IMPLANT
STRIP CLOSURE SKIN 1/2X4 (GAUZE/BANDAGES/DRESSINGS) IMPLANT
SUT PDS AB 0 CTX 36 PDP370T (SUTURE) IMPLANT
SUT PLAIN 2 0 XLH (SUTURE) IMPLANT
SUT VIC AB 0 CTX 36 (SUTURE) ×6
SUT VIC AB 0 CTX36XBRD ANBCTRL (SUTURE) ×4 IMPLANT
SUT VIC AB 2-0 CT1 27 (SUTURE) ×2
SUT VIC AB 2-0 CT1 TAPERPNT 27 (SUTURE) IMPLANT
SUT VIC AB 4-0 KS 27 (SUTURE) ×2 IMPLANT
SYR CONTROL 10ML LL (SYRINGE) ×2 IMPLANT
TOWEL OR 17X24 6PK STRL BLUE (TOWEL DISPOSABLE) ×2 IMPLANT
TRAY FOLEY W/BAG SLVR 14FR LF (SET/KITS/TRAYS/PACK) ×2 IMPLANT
WATER STERILE IRR 1000ML POUR (IV SOLUTION) ×2 IMPLANT

## 2022-12-05 NOTE — Discharge Summary (Shared)
Postpartum Discharge Summary  Date of Service updated***     Patient Name: Peggy Mckenzie DOB: [birth year] MRN: [id number]  Date of admission: 12/04/2022 Delivery date:12/05/2022  Delivering provider: [name] A  Date of discharge: 12/05/2022  Admitting diagnosis: S/P cesarean section [Z98.891] Intrauterine pregnancy: [redacted]w[redacted]d     Secondary diagnosis:  Principal Problem:   Preeclampsia, severe, third trimester Active Problems:   HSV infection   S/P cesarean section  Additional problems: ***    Discharge diagnosis: Preterm Pregnancy Delivered and Preeclampsia (severe)                                              Post partum procedures:{Postpartum procedures:23558} Augmentation: N/A Complications: None  Hospital course: Sceduled C/S   32 y.o. yo [id number] at [redacted]w[redacted]d was admitted to the hospital 12/04/2022 for scheduled cesarean section with the following indication: Preeclampsia w/ severe features, active HSV .Delivery details are as follows:  Membrane Rupture Time/Date: 1:53 AM ,12/05/2022   Delivery Method:C-Section, Low Vertical  Details of operation can be found in separate operative note.  Patient had a postpartum course complicated by***.  She is ambulating, tolerating a regular diet, passing flatus, and urinating well. Patient is discharged home in stable condition on  12/05/22        Newborn Data: Birth date:12/05/2022  Birth time:1:55 AM  Gender:Female  Living status:Living  Apgars: ,  Weight:2920 g     Magnesium Sulfate received: Yes: Seizure prophylaxis BMZ received: No Rhophylac:{Rhophylac received:30440032} MMR:{MMR:30440033} T-DaP:{Tdap:23962} Flu: {JIR:67893} Transfusion:{Transfusion received:30440034}  Physical exam  Vitals:   12/05/22 0016 12/05/22 0032 12/05/22 0045 12/05/22 0101  BP: 139/88 133/88 132/75 (!) 143/75  Pulse: 99 (!) 106 96 96  Resp:      Temp:      TempSrc:      Weight:      Height:       General: {Exam;  general:21111117} Lochia: {Desc; appropriate/inappropriate:30686::"appropriate"} Uterine Fundus: {Desc; firm/soft:30687} Incision: {Exam; incision:21111123} DVT Evaluation: {Exam; dvt:2111122} Labs: Lab Results  Component Value Date   WBC 10.8 (H) 12/04/2022   HGB 11.4 (L) 12/04/2022   HCT 35.6 (L) 12/04/2022   MCV 85.0 12/04/2022   PLT 290 12/04/2022      Latest Ref Rng & Units 12/04/2022    8:25 PM  CMP  Glucose 70 - 99 mg/dL 77   BUN 6 - 20 mg/dL 6   Creatinine 0.44 - 1.00 mg/dL 0.64   Sodium 135 - 145 mmol/L 133   Potassium 3.5 - 5.1 mmol/L 3.8   Chloride 98 - 111 mmol/L 101   CO2 22 - 32 mmol/L 21   Calcium 8.9 - 10.3 mg/dL 9.3   Total Protein 6.5 - 8.1 g/dL 6.4   Total Bilirubin 0.3 - 1.2 mg/dL 0.5   Alkaline Phos 38 - 126 U/L 78   AST 15 - 41 U/L 25   ALT 0 - 44 U/L 14    Edinburgh Score:    05/22/2021    4:53 PM  Edinburgh Postnatal Depression Scale Screening Tool  I have been able to laugh and see the funny side of things. 0  I have looked forward with enjoyment to things. 0  I have blamed myself unnecessarily when things went wrong. 1  I have been anxious or worried for no good reason. 1  I have felt scared or panicky  for no good reason. 1  Things have been getting on top of me. 1  I have been so unhappy that I have had difficulty sleeping. 1  I have felt sad or miserable. 1  I have been so unhappy that I have been crying. 1  The thought of harming myself has occurred to me. 0  Edinburgh Postnatal Depression Scale Total 7     After visit meds:  Allergies as of 12/05/2022   No Known Allergies   Med Rec must be completed prior to using this [facility]***        Discharge home in stable condition Infant Feeding: {Baby feeding:23562} Infant Disposition:{CHL IP OB HOME WITH SMOLMB:86754} Discharge instruction: per After Visit Summary and Postpartum booklet. Activity: Advance as tolerated. Pelvic rest for 6 weeks.  Diet: {OB GBEE:10071219} Future  Appointments:No future appointments. Follow up Visit:   Please schedule this patient for a In person postpartum visit in 4 weeks with the following provider: Any provider. Additional Postpartum F/U:Incision check 1 week  High risk pregnancy complicated by:  Preeclampsia w/severe heatures  Delivery mode:  C-Section, Low Vertical  Anticipated Birth Control:  PP IUD placed   12/05/2022 Concepcion Living, MD

## 2022-12-05 NOTE — Anesthesia Procedure Notes (Signed)
Spinal  Patient location during procedure: OR Start time: 12/05/2022 1:25 AM End time: 12/05/2022 1:27 AM Staffing Performed: anesthesiologist  Anesthesiologist: Darral Dash, DO Performed by: Darral Dash, DO Authorized by: Darral Dash, DO   Preanesthetic Checklist Completed: patient identified, IV checked, site marked, risks and benefits discussed, surgical consent, monitors and equipment checked, pre-op evaluation and timeout performed Spinal Block Patient position: sitting Prep: DuraPrep Patient monitoring: heart rate, cardiac monitor, continuous pulse ox and blood pressure Approach: midline Location: L4-5 Injection technique: single-shot Needle Needle type: Pencan  Needle gauge: 24 G Needle length: 10 cm Assessment Events: CSF return Additional Notes Patient identified. Risks/Benefits/Options discussed with patient including but not limited to bleeding, infection, nerve damage, paralysis, failed block, incomplete pain control, headache, blood pressure changes, nausea, vomiting, reactions to medications, itching and postpartum back pain. Confirmed with bedside nurse the patient's most recent platelet count. Confirmed with patient that they are not currently taking any anticoagulation, have any bleeding history or any family history of bleeding disorders. Patient expressed understanding and wished to proceed. All questions were answered. Sterile technique was used throughout the entire procedure. Please see nursing notes for vital signs. Warning signs of high block given to the patient including shortness of breath, tingling/numbness in hands, complete motor block, or any concerning symptoms with instructions to call for help. Patient was given instructions on fall risk and not to get out of bed. All questions and concerns addressed with instructions to call with any issues or inadequate analgesia.

## 2022-12-05 NOTE — Anesthesia Postprocedure Evaluation (Signed)
Anesthesia Post Note  Patient: MYLES MALLICOAT  Procedure(s) Performed: CESAREAN SECTION INTRAUTERINE DEVICE (IUD) INSERTION     Patient location during evaluation: PACU Anesthesia Type: Spinal Level of consciousness: oriented and awake and alert Pain management: pain level controlled Vital Signs Assessment: post-procedure vital signs reviewed and stable Respiratory status: spontaneous breathing and respiratory function stable Cardiovascular status: blood pressure returned to baseline and stable Postop Assessment: no headache, no backache and no apparent nausea or vomiting Anesthetic complications: no   No notable events documented.  Last Vitals:  Vitals:   12/05/22 0400 12/05/22 0414  BP: 120/82 (!) 117/53  Pulse: 94 90  Resp: (!) 22 20  Temp:  37.1 C  SpO2: 97% 98%    Last Pain:  Vitals:   12/05/22 0414  TempSrc: Oral  PainSc:                  March Rummage Maurita Havener

## 2022-12-05 NOTE — Transfer of Care (Signed)
Immediate Anesthesia Transfer of Care Note  Patient: Peggy Mckenzie  Procedure(s) Performed: CESAREAN SECTION INTRAUTERINE DEVICE (IUD) INSERTION  Patient Location: PACU  Anesthesia Type:Spinal  Level of Consciousness: awake, alert  and oriented  Airway & Oxygen Therapy: Patient Spontanous Breathing  Post-op Assessment: Report given to RN and Post -op Vital signs reviewed and stable  Post vital signs: Reviewed and stable  Last Vitals:  Vitals Value Taken Time  BP 115/89 12/05/22 0300  Temp    Pulse 90 12/05/22 0305  Resp    SpO2 99 % 12/05/22 0305  Vitals shown include unvalidated device data.  Last Pain:  Vitals:   12/04/22 2242  TempSrc:   PainSc: 5          Complications: No notable events documented.

## 2022-12-05 NOTE — Lactation Note (Signed)
This note was copied from a baby's chart. Lactation Consultation Note  Patient Name: Peggy Mckenzie ACZYS'A Date: 12/05/2022 Reason for consult: Initial assessment;Late-preterm 34-36.6wks;Other (Comment);Exclusive pumping and bottle feeding (GHTN on MgSO4) Age:34 hours  LC in to visit with P5 Mom of LPTI delivered by C/Section.  Baby STS on Mom's chest when LC entered the room. Mom is interested in pumping and breast milk feeding baby at present.   Peggy Mckenzie set up DEBP and assisted with first pumping.  40 ml expressed.  Crib card placed in crib and Mom is aware of guidelines for supplementation.    Plan recommended- 1-Keep baby STS as much as possible. 2- At least every 3 hrs, supplement baby with EBM+/formula per LPT guidelines, by paced bottle. 3-Pump both breast on initiation setting unless volume continues to be over 20 ml times 4, then change to maintenance setting.   Mom aware of IP and OP lactation support available to her and encouraged to call prn.  Maternal Data Has patient been taught Hand Expression?: Yes Does the patient have breastfeeding experience prior to this delivery?: Yes How long did the patient breastfeed?: 7 months  First baby had an undiagnosed tongue and lip restriction and had difficult breast and bottle feeding.    Feeding Mother's Current Feeding Choice: Breast Milk and Formula Nipple Type: Extra Slow Flow  Lactation Tools Discussed/Used Tools: Pump;Flanges;Bottle Flange Size: 24 Breast pump type: Double-Electric Breast Pump Pump Education: Setup, frequency, and cleaning;Milk Storage Reason for Pumping: Support milk supply/LPTI/Mom choosing to pump and bottle at present Pumping frequency: Encouraged double pumping every 2-3 hrs when awake Pumped volume: 40 mL  Interventions Interventions: Breast feeding basics reviewed;Skin to skin;Breast massage;Hand express;Expressed milk;Support pillows;DEBP;Education;Pace feeding;LC Magazine features editor;Infant  Driven Feeding Algorithm education  Discharge Pump: Employee Pump;Personal;Hands Free (Mom received this already) [facility] Program: No  Consult Status Consult Status: [name] Date: 12/06/22 [name] type: In-patient    Peggy Mckenzie 12/05/2022, 10:56 AM

## 2022-12-05 NOTE — Anesthesia Preprocedure Evaluation (Signed)
Anesthesia Evaluation  Patient identified by MRN, date of birth, ID band Patient awake    Reviewed: Allergy & Precautions, NPO status , Patient's Chart, lab work & pertinent test results  Airway Mallampati: II  TM Distance: >3 FB Neck ROM: Full    Dental no notable dental hx.    Pulmonary neg pulmonary ROS   Pulmonary exam normal        Cardiovascular hypertension,  Rhythm:Regular Rate:Normal     Neuro/Psych  Headaches   Depression Bipolar Disorder      GI/Hepatic negative GI ROS, Neg liver ROS,,,  Endo/Other  negative endocrine ROS    Renal/GU negative Renal ROS  negative genitourinary   Musculoskeletal negative musculoskeletal ROS (+)    Abdominal Normal abdominal exam  (+)   Peds  Hematology  (+) Blood dyscrasia, anemia Lab Results      Component                Value               Date                      WBC                      10.8 (H)            12/04/2022                HGB                      11.4 (L)            12/04/2022                HCT                      35.6 (L)            12/04/2022                MCV                      85.0                12/04/2022                PLT                      290                 12/04/2022              Anesthesia Other Findings   Reproductive/Obstetrics (+) Pregnancy                             Anesthesia Physical Anesthesia Plan  ASA: 2  Anesthesia Plan: Spinal   Post-op Pain Management:    Induction:   PONV Risk Score and Plan: 2 and Treatment may vary due to age or medical condition  Airway Management Planned: Simple Face Mask, Natural Airway and Nasal Cannula  Additional Equipment: None  Intra-op Plan:   Post-operative Plan:   Informed Consent: I have reviewed the patients History and Physical, chart, labs and discussed the procedure including the risks, benefits and alternatives for the proposed anesthesia with the  patient or authorized representative who has indicated his/her understanding and  acceptance.     Dental advisory given  Plan Discussed with:   Anesthesia Plan Comments:        Anesthesia Quick Evaluation

## 2022-12-05 NOTE — H&P (Signed)
History    CSN: [id number]   Arrival date and time: 12/04/22 1901    Event Date/Time   First Provider Initiated Contact with Patient 12/04/22 2018          Chief Complaint  Patient presents with   Headache    HPI [name] is a 32 y.o. year old [id number] female at [redacted]w[redacted]d weeks gestation who presents to [facility] reporting headache since Monday. She has tried Tylenol 1000 mg for the H/A; last dose @ 1700. She reports that gives only minimal relief. She reports that at her OB appointment yesterday, her BP was 140/83. She was sent to the hospital (in [place], [place]) for a PEC evaluation. She was not given any anti-HTN meds. When she called her OB's office today to report the on-going H/A, she was advised to take Tylenol 1000 mg, Benadryl and Reglan. She did not take the Benadryl and Reglan, because she was scheduled to work Quarry manager. She has since called out of work and decided to come here for evaluation. She reports BPs at home today were 149/98 and 144/93. She receives [facility] in [place], [place]. She is scheduled for IOL @ 37 weeks.    OB History       Gravida  6   Para  4   Term  3   Preterm  1   AB  1   Living  4        SAB  1   IAB      Ectopic      Multiple  0   Live Births  4                   Past Medical History:  Diagnosis Date   Anemia     Bipolar 1 disorder (HCC)     Compartment syndrome (HCC) 2006    Left leg   Depression     Fracture 2006    Left  leg requiring rods and screws   Headache     Hemorrhage      post partum hemorrhage   Infection      frequent UTI's   Pregnancy induced hypertension     Pyelonephritis during pregnancy 2021           Past Surgical History:  Procedure Laterality Date   CYST REMOVAL HAND       CYSTOLITHOTOMY       FRACTURE SURGERY Left      leg           Family History  Problem Relation Age of Onset   Birth defects Paternal Aunt          spina bifida   Hypertension Paternal  Grandmother        Social History         Tobacco Use   Smoking status: Never   Smokeless tobacco: Never  Vaping Use   Vaping Use: Never used  Substance Use Topics   Alcohol use: No      Comment: occasional,, not now   Drug use: No      Allergies: No Known Allergies          Medications Prior to Admission  Medication Sig Dispense Refill Last Dose   acetaminophen (TYLENOL) 325 MG tablet Take 2 tablets (650 mg total) by mouth every 4 (four) hours as needed (for pain scale < 4).     12/04/2022 at 1700   aspirin EC 81 MG tablet  Take 81 mg by mouth daily. Swallow whole.     12/04/2022   ferrous sulfate 325 (65 FE) MG EC tablet Take 325 mg by mouth 3 (three) times daily with meals.     12/04/2022   ondansetron (ZOFRAN) 4 MG tablet Take 1 tablet (4 mg total) by mouth every 8 (eight) hours as needed for nausea or vomiting. 20 tablet 0 Past Month   Prenatal Vit-Fe Fumarate-FA (PRENATAL MULTIVITAMIN) TABS tablet Take 1 tablet by mouth daily at 12 noon.     12/04/2022   sertraline (ZOLOFT) 25 MG tablet Take 25 mg by mouth daily.     12/04/2022      Review of Systems  Constitutional: Negative.   Eyes:  Positive for photophobia.  Respiratory: Negative.    Cardiovascular: Negative.   Gastrointestinal: Negative.   Endocrine: Negative.   Musculoskeletal: Negative.   Skin: Negative.   Allergic/Immunologic: Negative.   Neurological:  Positive for headaches (since Monday; Tylenol hleps minimally, "but not for long").  Hematological: Negative.   Psychiatric/Behavioral: Negative.      Physical Exam    Patient Vitals for the past 24 hrs:   BP Temp Temp src Pulse Resp Height Weight  12/04/22 2332 (!) 140/74 -- -- 99 -- -- --  12/04/22 2315 (!) 152/79 -- -- 96 -- -- --  12/04/22 2300 (!) 143/89 -- -- 91 -- -- --  12/04/22 2245 (!) 151/87 -- -- 93 -- -- --  12/04/22 2230 (!) 145/88 -- -- 96 -- -- --  12/04/22 2216 (!) 162/92 -- -- 90 -- -- --  12/04/22 2200 (!) 153/84 -- -- 89 -- -- --   12/04/22 2145 139/88 -- -- 83 -- -- --  12/04/22 2130 133/75 -- -- 85 -- -- --  12/04/22 2115 133/78 -- -- 78 -- -- --  12/04/22 2100 125/72 -- -- 85 -- -- --  12/04/22 2045 130/79 -- -- 92 -- -- --  12/04/22 2030 123/64 -- -- 82 -- -- --  12/04/22 2015 135/65 -- -- 82 -- -- --  12/04/22 2001 132/78 -- -- 88 -- -- --  12/04/22 2000 131/74 -- -- 86 -- -- --  12/04/22 1951 133/76 -- -- 89 -- -- --  12/04/22 1923 128/62 98.8 F (37.1 C) Oral 89 18 5\' 3"  (1.6 m) 106.9 kg      Physical Exam Vitals and nursing note reviewed.  Constitutional:      Appearance: Normal appearance. She is obese.  HENT:     Head: Normocephalic and atraumatic.  Cardiovascular:     Rate and Rhythm: Normal rate.  Pulmonary:     Effort: Pulmonary effort is normal.  Musculoskeletal:        General: Normal range of motion.  Skin:    General: Skin is warm and dry.  Neurological:     Mental Status: She is alert and oriented to person, place, and time.  Psychiatric:        Mood and Affect: Mood normal.        Behavior: Behavior normal.        Thought Content: Thought content normal.        Judgment: Judgment normal.      REACTIVE NST - FHR: 140 bpm / moderate variability / accels present / decels absent / TOCO: Occ UC with UI noted  MAU Course  Procedures   MDM CCUA CBC CMP P/C Ratio Serial BP's  Caffeine 200 mg po -- H/A unchanged after 1 hour  Flexeril 10 mg po --    Lab Results Last 24 Hours       Results for orders placed or performed during the hospital encounter of 12/04/22 (from the past 24 hour(s))  Protein / creatinine ratio, urine     Status: Abnormal    Collection Time: 12/04/22  7:40 PM  Result Value Ref Range    Creatinine, Urine 64 mg/dL    Total Protein, Urine 10 mg/dL    Protein Creatinine Ratio 0.16 (H) 0.00 - 0.15 mg/mg[Cre]  CBC     Status: Abnormal    Collection Time: 12/04/22  8:25 PM  Result Value Ref Range    WBC 10.8 (H) 4.0 - 10.5 K/uL    RBC 4.19 3.87 - 5.11 MIL/uL     Hemoglobin 11.4 (L) 12.0 - 15.0 g/dL    HCT 95.6 (L) 38.7 - 46.0 %    MCV 85.0 80.0 - 100.0 fL    MCH 27.2 26.0 - 34.0 pg    MCHC 32.0 30.0 - 36.0 g/dL    RDW 56.4 (H) 33.2 - 15.5 %    Platelets 290 150 - 400 K/uL    nRBC 0.0 0.0 - 0.2 %  Comprehensive metabolic panel     Status: Abnormal    Collection Time: 12/04/22  8:25 PM  Result Value Ref Range    Sodium 133 (L) 135 - 145 mmol/L    Potassium 3.8 3.5 - 5.1 mmol/L    Chloride 101 98 - 111 mmol/L    CO2 21 (L) 22 - 32 mmol/L    Glucose, Bld 77 70 - 99 mg/dL    BUN 6 6 - 20 mg/dL    Creatinine, Ser 9.51 0.44 - 1.00 mg/dL    Calcium 9.3 8.9 - 88.4 mg/dL    Total Protein 6.4 (L) 6.5 - 8.1 g/dL    Albumin 2.9 (L) 3.5 - 5.0 g/dL    AST 25 15 - 41 U/L    ALT 14 0 - 44 U/L    Alkaline Phosphatase 78 38 - 126 U/L    Total Bilirubin 0.5 0.3 - 1.2 mg/dL    GFR, Estimated >16 >60 mL/min    Anion gap 11 5 - 15       Report given to Sharen Counter, CNM @ 2146   Raelyn Mora, CNM 12/04/2022, 8:18 PM    MDM:   BP elevated and headache not resolved with Tylenol, caffeine, and oxycodone.  Pt reports HSV outbreak starting 4-5 days ago that is not fully healed.  Consult Dr Macon Large with assessment and findings.  Admit to OBOR for delivery for [facility] with severe features by cesarean for HSV outbreak.     Assessment and Plan    1. Severe preeclampsia, third trimester   2. [redacted] weeks gestation of pregnancy       Admit to OBOR.   Sharen Counter, CNM 12:04 AM     Attestation of Attending Supervision of Advanced Practice Provider (CNM/NP/PA): Evaluation and management procedures were performed by the Advanced Practice Provider under my supervision and collaboration.  I have reviewed the Advanced Practice Provider's note and chart, and I agree with the management and plan. I have also made any necessary editorial changes.   Patient with severe preeclampsia at [redacted]w[redacted]d, delivery indicated.  Ongoing untreated genital HSV outbreak,  cesarean delivery indicated.  This was discussed with patient, she agreed with this plan.  Patient noted that she wants copper IUD placed at time of cesarean section  for long term contraception.  The risks of surgery were discussed with the patient including but were not limited to: bleeding which may require transfusion or reoperation; infection which may require antibiotics; injury to bowel, bladder, ureters or other surrounding organs; injury to the fetus; need for additional procedures including hysterectomy in the event of a life-threatening hemorrhage; formation of adhesions; placental abnormalities wth subsequent pregnancies; incisional problems; thromboembolic phenomenon and other postoperative/anesthesia complications.   For the postplacental IUD placement, discussed risks of postpartum expulsion, irregular bleeding, cramping, infection, malpositioning or misplacement of the IUD which may require further procedures. Also discussed >99% contraception efficacy, increased risk of ectopic pregnancy with failure of method.   The patient concurred with the proposed plan, giving informed written consent for the procedures.   Patient has been NPO since 1300 yesterday she will remain NPO for procedure. Anesthesia, Neonatology and OR aware. Preoperative prophylactic antibiotics, TXA and SCDs ordered on call to the OR.  To OR when ready.       [name], MD, [place] Attending [place], [facility] for [employer], [place]

## 2022-12-05 NOTE — Op Note (Signed)
[place] PROCEDURE DATE: 12/05/2022  PREOPERATIVE DIAGNOSES: Intrauterine pregnancy at [redacted]w[redacted]d weeks gestation; herpes virus infection and severe preeclampsia  POSTOPERATIVE DIAGNOSES: The same  PROCEDURE: Low Transverse Cesarean Section and Intrauterine Device Placement  SURGEON:  Dr. [name]  ASSISTANT:  Dr. Gifford Shave. An experienced assistant was required given the standard of surgical care given the complexity of the case.  This assistant was needed for exposure, dissection, suctioning, retraction, instrument exchange, assisting with delivery with administration of fundal pressure, and for overall help during the procedure.  ANESTHESIOLOGY TEAM: Anesthesiologist: Darral Dash, DO CRNA: Kathie Rhodes, CRNA  INDICATIONS: Peggy Mckenzie is a 32 y.o. [phone] at [redacted]w[redacted]d here for cesarean section secondary to the indications listed under preoperative diagnoses; please see preoperative note for further details.  The risks of surgery were discussed with the patient including but were not limited to: bleeding which may require transfusion or reoperation; infection which may require antibiotics; injury to bowel, bladder, ureters or other surrounding organs; injury to the fetus; need for additional procedures including hysterectomy in the event of a life-threatening hemorrhage; formation of adhesions; placental abnormalities wth subsequent pregnancies; incisional problems; thromboembolic phenomenon and other postoperative/anesthesia complications.  The patient concurred with the proposed plan, giving informed written consent for the procedure.    FINDINGS:  Viable female infant in cephalic presentation.  Apgars pending at time of this note but infant stable in the operating room and with her mother.  Clear amniotic fluid.  Intact placenta, three vessel cord.  Normal uterus, fallopian tubes and ovaries bilaterally. Mirena IUD was placed into fundal region of the uterus prior  to hysterotomy closure.  ANESTHESIA: Spinal ESTIMATED BLOOD LOSS: 286 ml SPECIMENS: Placenta sent to L&D COMPLICATIONS: None immediate  PROCEDURE IN DETAIL:  The patient preoperatively received intravenous antibiotics and had sequential compression devices applied to her lower extremities.  She was then taken to the operating room where spinal anesthesia was administered and was found to be adequate. She was then placed in a dorsal supine position with a leftward tilt, and prepped and draped in a sterile manner.  A foley catheter was placed into her bladder and attached to constant gravity.  After an adequate timeout was performed, a Pfannenstiel skin incision was made with scalpel and carried through to the underlying layer of fascia. The fascia was incised in the midline, and this incision was extended bilaterally in a blunt fashion.  The underlying rectus muscles were dissected off the fascia superiorly and inferiorly in a blunt fashion. The rectus muscles were separated in the midline and the peritoneum was entered bluntly. The Alexis self-retaining retractor was introduced into the abdominal cavity.  Attention was turned to the lower uterine segment where a low transverse hysterotomy was made with a scalpel and extended bilaterally bluntly.  The infant was successfully delivered, the cord was clamped and cut after one minute, and the infant was handed over to the awaiting neonatology team. Uterine massage was then administered, and the placenta delivered intact with a three-vessel cord. The uterus was then cleared of clots and debris.  The hysterotomy was closed with 0 Vicryl in a running locked fashion, and an imbricating layer was also placed with 0 Vicryl.  The Mirena IUD was placed in the fundal region, and the strings were pushed through the lower uterine segment into cervix and upper vagina. The pelvis was cleared of all clot and debris. Hemostasis was confirmed on all surfaces.  The retractor was  removed.  The peritoneum was closed with a 0 Vicryl running stitch. The fascia was then closed using 0 Vicryl in a running fashion.  The subcutaneous layer was irrigated, reapproximated with 2-0 plain gut interrupted stitches, and the skin was closed with a 4-0 Vicryl subcuticular stitch. The patient tolerated the procedure well. Sponge, instrument and needle counts were correct x 3.  She was taken to the recovery room in stable condition.    [name], MD, [place] for [employer], [place]

## 2022-12-06 ENCOUNTER — Encounter (HOSPITAL_COMMUNITY): Payer: Self-pay | Admitting: Obstetrics & Gynecology

## 2022-12-06 LAB — CBC
HCT: 29.2 % — ABNORMAL LOW (ref 36.0–46.0)
Hemoglobin: 9.7 g/dL — ABNORMAL LOW (ref 12.0–15.0)
MCH: 28.3 pg (ref 26.0–34.0)
MCHC: 33.2 g/dL (ref 30.0–36.0)
MCV: 85.1 fL (ref 80.0–100.0)
Platelets: 317 10*3/uL (ref 150–400)
RBC: 3.43 MIL/uL — ABNORMAL LOW (ref 3.87–5.11)
RDW: 17.4 % — ABNORMAL HIGH (ref 11.5–15.5)
WBC: 15.4 10*3/uL — ABNORMAL HIGH (ref 4.0–10.5)
nRBC: 0 % (ref 0.0–0.2)

## 2022-12-06 MED ORDER — FUROSEMIDE 20 MG PO TABS
20.0000 mg | ORAL_TABLET | Freq: Every day | ORAL | Status: DC
  Administered 2022-12-06 – 2022-12-08 (×3): 20 mg via ORAL
  Filled 2022-12-06 (×3): qty 1

## 2022-12-06 NOTE — Clinical Social Work Maternal (Addendum)
CLINICAL SOCIAL WORK MATERNAL/CHILD NOTE  Patient Details  Name: Peggy Mckenzie MRN: [id number] Date of Birth: [birth year]  Date:  12/06/2022  Clinical Social Worker Initiating Note:  Idamae Lusher, LCSWA Date/Time: Initiated:  12/06/22/1646     Child's Name:  Peggy Mckenzie   Biological Parents:  Mother, Father Peggy Mckenzie, [place]: 10/19/1984)   Need for Interpreter:  None   Reason for Referral:  Behavioral Health Concerns   Address:  [place] [address]    Phone number:  [phone] (home)     Additional phone number:   Household Members/Support Persons (HM/SP):   Household Member/Support Person 1, Household Member/Support Person 2, Household Member/Support Person 3, Household Member/Support Person 4, Household Member/Support Person 5   HM/SP Name Relationship DOB or Age  HM/SP -Peggy Mckenzie FOB 10/19/1984  HM/SP -Peggy Mckenzie Son 05/10/2021  HM/SP -3 Peggy Mckenzie Daughter 08/21/2016  HM/SP -Peggy Mckenzie Son 10/18/2014  HM/SP -5 Peggy Mckenzie 12/14/2009  HM/SP -6        HM/SP -7        HM/SP -8          Natural Supports (not living in the home):  Extended Family, Friends   Professional Supports: Case Metallurgist (Nurse, mental health)   Employment: Full-time   Type of Work: Passenger transport manager, [address] [facility]   Education:  [place] arranged:    Museum/gallery curator Resources:  [employer], Multimedia programmer    Other Resources:  [facility]   Cultural/Religious Considerations Which May Impact Care:  None identified  Strengths:  Ability to meet basic needs  , Home prepared for child  , Pediatrician chosen   Psychotropic Medications:         Pediatrician:    [place]olic[place] area  Pediatrician List:   Peggy  Mckenzie      Pediatrician Fax Number:    Risk Factors/Current Problems:  Mental Health Concerns      Cognitive State:  Able to Concentrate  , Alert  , Goal Oriented  , Linear Thinking  , Insightful     Mood/Affect:  Euthymic  , Comfortable  , Relaxed  , Interested     CSW Assessment: CSW was consulted due to history of Bipolar Disorder I and history of depression. CSW met with MOB at bedside to complete assessment. CSW introduced self and explained reason for consult. MOB presented as calm, was agreeable to consult and remained engaged throughout encounter.   CSW inquired how MOB has felt emotionally since delivery and during pregnancy. MOB shared that having a c-section was not planned and MOB identified feelings of overwhelm prior to procedure and stress during recovery due to being limited in mobility and experiencing physical pain. CSW provided emotional support and validated MOB's feelings. MOB noted that she did experience depression during the beginning of her pregnancy, triggered by a short interval between pregnancies and noted psychosocial stressors,marked by a custody court case with her older children's father regarding child support. MOB states she expressed concerns of depression with her OBGYN during the beginning of her pregnancy and was restarted on Zoloft. MOB shares that she felt okay the remainder of her pregnancy and notes that Zoloft has also worked well for her in the past to manage symptoms of depression and postpartum anxiety.   MOB shares that during her pregnancy  with infant, she was connected with a Maternity Care Freight forwarder social worker, Peggy Mckenzie after she scored positive on GAD7 and PHQ9 screeners during an OBGYN pnc visit (when MOB was restarted on Zoloft). MOB shares that she believes the program was optional through [facility] but did not realize that it was an optional support at the time. MOB shared that the social worker attended all prenatal care visits with MOB; however, MOB did not feel she needed the extra support.  MOB added that she did experience  postpartum depression and/or anxiety after all of her past pregnancies (MOB has 5 children including infant). MOB states that she was first diagnosed with Bipolar Disorder I after she had her first child in 2011. MOB recalls endorsing symptoms of mania around this time (2011-2012). MOB recalls she was prescribed [name] and Zoloft. MOB denies endorsing manic episodes since soon after her first child was born. MOB explained that she has experienced bouts of depression and postpartum depression and anxiety since this time which she has been able to successfully treat with medication management. MOB states she discontinued [name] when she became pregnant with her 4th child in 2021. MOB recalls endorsing postpartum anxiety after her 4th child was born in 2022, marked by feeling afraid to put her child to sleep due to fear of SIDS. MOB states she was started on Zoloft around 4 months postpartum which helped to decrease feelings of anxiety. MOB is not current with a therapist but was agreeable to outpatient mental health resources, which CSW provided.   MOB identified FOB, extended family, and friends as supports. 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 CSW commended MOB for prioritizing her mental health. MOB noted that FOB will be taking 2-3 weeks off from work to help MOB heal from her C-section and adjust postpartum. MOB denied current SI/HI.  CSW provided education regarding the baby blues period vs. perinatal mood disorders, discussed treatment and gave resources for mental health follow up if concerns arise.  CSW recommends self-evaluation during the postpartum time period using the New Mom Checklist from Postpartum Progress and encouraged MOB to contact a medical professional if symptoms are noted at any time.    MOB reports she has all needed items for infant, including a car seat and bassinet. MOB receives [facility] and expressed interest in food stamps. CSW provided  MOB with online application for food stamps via text. MOB denied additional resource needs.  CSW provided review of Sudden Infant Death Syndrome (SIDS) precautions.    CSW identifies no further need for intervention and no barriers to discharge at this time.  CSW Plan/Description:  No Further Intervention Required/No Barriers to Discharge, Sudden Infant Death Syndrome (SIDS) Education, Perinatal Mood and Anxiety Disorder (PMADs) Education, Other Information/Referral to [place] 12/06/2022, 4:53 PM

## 2022-12-06 NOTE — Progress Notes (Signed)
POSTPARTUM PROGRESS NOTE  POD #1  Subjective:  ATLAS KUC is a 32 y.o. [id number] s/p pLTCS at [redacted]w[redacted]d. Today she notes that she is doing ok. She denies any problems with ambulating, voiding or po intake. Denies nausea or vomiting. She has not yet passed flatus, no BM.  Pain is tolerable, taking medication prn.  Lochia appropriately  Denies fever/chills/chest pain/SOB.  no HA, no blurry vision, no RUQ pain  Objective: Blood pressure 116/69, pulse 74, temperature 98.4 F (36.9 C), temperature source Oral, resp. rate 17, height 5\' 3"  (1.6 m), weight 106.9 kg, SpO2 99 %, unknown if currently breastfeeding. UOP: 800cc/hr  Physical Exam:  General: alert, cooperative and no distress Chest: no respiratory distress Heart: regular rate and rhythm Abdomen: soft, nontender, +BS Uterine Fundus: firm, appropriately tender Incision: with pressure dressing- clean and dry DVT Evaluation: No calf swelling or tenderness Extremities: 1+ edema, no clonus Skin: warm, dry  Results for orders placed or performed during the hospital encounter of 12/04/22 (from the past 24 hour(s))  CBC     Status: Abnormal   Collection Time: 12/06/22  4:19 AM  Result Value Ref Range   WBC 15.4 (H) 4.0 - 10.5 K/uL   RBC 3.43 (L) 3.87 - 5.11 MIL/uL   Hemoglobin 9.7 (L) 12.0 - 15.0 g/dL   HCT 29.2 (L) 36.0 - 46.0 %   MCV 85.1 80.0 - 100.0 fL   MCH 28.3 26.0 - 34.0 pg   MCHC 33.2 30.0 - 36.0 g/dL   RDW 17.4 (H) 11.5 - 15.5 %   Platelets 317 150 - 400 K/uL   nRBC 0.0 0.0 - 0.2 %    Assessment/Plan: PRINCETTA UPLINGER is a 32 y.o. [id number] s/p pLTCS at [redacted]w[redacted]d POD#1 complicated by: 1) Preeclampsia with severe features -s/p Magnesium -BP low normal, discontinued Procardia -continue lasix for a total of 5 days  2) Postop care -pain controlled -encourage ambulation -SCDs for DVT prophylaxis  Contraception: IUD in place Feeding: breast feeding  Dispo: Continue postop care as outlined above   LOS: 1 day    Janyth Pupa, DO Faculty Attending, [facility] 12/06/2022, 9:35 AM

## 2022-12-06 NOTE — Lactation Note (Signed)
This note was copied from a baby's chart. Lactation Consultation Note  Patient Name: Peggy Mckenzie Date: 12/06/2022 Reason for consult: [name] assessment;Infant weight loss;Late-preterm 34-36.6wks;Breastfeeding assistance (4.66% WL) Age:34 hours  LC entered the room and the infant was laying on the birth parent.  Per the birth parent, She has not been pumping and putting the infant to the breast.  She stated that she pumped 51mL yesterday and was only able to get drops after that.   The birth parent commented that she became discouraged and has not pumped.  LC encouraged the birth parent to stick to her every 3 hours pumping schedule.  The birth parent remarked that she has started to feel full today and is planning to pump after taking a shower. We talked about how Mag & C-sections can affect milk supply and why supply and demand are important.  The birth parent asked questions about pumping when she returns to work due to her supply decreasing when she went back to work with her previous child.  All questions were answered.  The infant has increased in supplementation volumes.  The birth parent said that she would like to work on latching the infant today and will call for latch assistance.   Feeding Mother's Current Feeding Choice: Breast Milk and Formula  Lactation Tools Discussed/Used Pumping frequency: q3hrs  Interventions Interventions: Breast feeding basics reviewed;Education  Consult Status Consult Status: [name] Date: 12/07/22 [name] type: In-patient  Lysbeth Penner 12/06/2022, 12:25 PM

## 2022-12-07 DIAGNOSIS — D62 Acute posthemorrhagic anemia: Secondary | ICD-10-CM | POA: Diagnosis not present

## 2022-12-07 MED ORDER — OXYCODONE HCL 5 MG PO TABS: 5.0000 mg | ORAL_TABLET | ORAL | Status: DC | PRN

## 2022-12-07 MED ORDER — FERROUS SULFATE 325 (65 FE) MG PO TABS: 325.0000 mg | ORAL_TABLET | ORAL | Status: DC

## 2022-12-07 NOTE — Progress Notes (Signed)
POSTPARTUM PROGRESS NOTE  POD #2  Subjective:  Peggy Mckenzie is a 32 y.o. [id number] s/p pLTCS at [redacted]w[redacted]d. Today she notes that she is doing ok. She denies any problems with ambulating, voiding or po intake. Denies nausea or vomiting. She has passed flatus, no BM.  Pain is tolerable, taking medication prn.  Lochia appropriate.  Denies fever/chills/chest pain/SOB.  no HA, no blurry vision, no RUQ pain  Objective: Blood pressure (!) 115/57, pulse 67, temperature 97.9 F (36.6 C), temperature source Oral, resp. rate 18, height 5\' 3"  (1.6 m), weight 106.9 kg, SpO2 98 %, unknown if currently breastfeeding.  Physical Exam:  General: alert, cooperative and no distress Chest: no respiratory distress Heart: regular rate and rhythm Abdomen: soft, nontender, +BS Uterine Fundus: firm, appropriately tender Incision: with pressure dressing- clean and dry DVT Evaluation: No calf swelling or tenderness Extremities: 1+ edema, no clonus Skin: warm, dry  Labs:    Latest Ref Rng & Units 12/06/2022    4:19 AM 12/04/2022    8:25 PM 11/30/2022   11:31 PM  CBC  WBC 4.0 - 10.5 K/uL 15.4  10.8  9.8   Hemoglobin 12.0 - 15.0 g/dL 9.7  11.4  10.4   Hematocrit 36.0 - 46.0 % 29.2  35.6  31.9   Platelets 150 - 400 K/uL 317  290  271     Assessment/Plan: Peggy Mckenzie is a 32 y.o. [id number] s/p pLTCS at [redacted]w[redacted]d POD#2 complicated by: 1) Preeclampsia with severe features -s/p Magnesium -BP low normal, not on Procardia -continue lasix for a total of 5 days  2) Postop care -pain controlled -oral iron therapy prescribed for acute blood loss anemia -encourage ambulation -SCDs for DVT prophylaxis  Contraception: IUD in place Feeding: breast feeding  Dispo: Continue postop care as outlined above   LOS: 2 days   [name], MD Faculty Attending, [facility] 12/07/2022, 7:51 AM

## 2022-12-07 NOTE — Lactation Note (Signed)
This note was copied from a baby's chart. Lactation Consultation Note  Patient Name: Peggy Mckenzie [id number] Date: 12/07/2022 Reason for consult: [name] assessment;Late-preterm 34-36.6wks Age:34 hours  LC in to see mother of LPTI, 35.[redacted] weeks gestation with previous experience of breastfeeding for 7 months.  Mother has been pumping and expressing 90 ml as her milk is coming to volume. Baby is tolerating breast milk and formula by bottle. Mother would like to latch baby now that her milk volume has increased. Baby alert with diaper change. Assisted mother with basic breastfeeding education in football hold and good pillow support. With a few attempts, baby latched to the breast well and fed with audible swallows. She would come off the breast and with cues, mother was able to re-latch. Mother is aware due to gestational age of baby to keep breastfeedings to active feedings with swallows, limit to 10 minutes and watch for infant fatigue. She will continue to supplement baby with expressed milk.   Mother reports she does have a DEBP for home use and has received her pump through her insurance already [employer] employee).  Reviewed milk collection, storage, establishing/ maintaining  a milk supply and management of engorgement. Mother is hopeful that she and baby will go home tomorrow.      Feeding Nipple Type: Nfant Slow Flow (purple)  LATCH Score Latch: Repeated attempts needed to sustain latch, nipple held in mouth throughout feeding, stimulation needed to elicit sucking reflex.  Audible Swallowing: Spontaneous and intermittent  Type of Nipple: Everted at rest and after stimulation  Comfort (Breast/Nipple): Filling, red/small blisters or bruises, mild/mod discomfort  Hold (Positioning): Assistance needed to correctly position infant at breast and maintain latch.  LATCH Score: 7   Lactation Tools Discussed/Used Tools: Pump;Flanges Flange Size: 24 Breast pump type:  Double-Electric Breast Pump Pump Education:  (reviewed) Reason for Pumping: feeding supplementation/ production Pumping frequency: Q 3 hours/ expressing 90 ml  Interventions Interventions: Breast feeding basics reviewed;Assisted with latch;Skin to skin;Hand express;Breast compression;Adjust position;Support pillows;Education  Discharge Discharge Education: Engorgement and breast care;Warning signs for feeding baby Pump:  (mother has obtained breast pump from insurance)  Consult Status Consult Status: [name] Date: 12/08/22 [name] type: In-patient   Stana Bunting M 12/07/2022, 5:01 PM

## 2022-12-08 ENCOUNTER — Other Ambulatory Visit (HOSPITAL_COMMUNITY): Payer: Self-pay

## 2022-12-08 MED ORDER — SENNOSIDES-DOCUSATE SODIUM 8.6-50 MG PO TABS
2.0000 | ORAL_TABLET | Freq: Two times a day (BID) | ORAL | 2 refills | Status: AC | PRN
  Filled 2022-12-08: qty 30, 8d supply, fill #0

## 2022-12-08 MED ORDER — FUROSEMIDE 20 MG PO TABS: 20.0000 mg | ORAL_TABLET | Freq: Every day | ORAL | 0 refills | Status: DC

## 2022-12-08 MED ORDER — FERROUS SULFATE 325 (65 FE) MG PO TABS
325.0000 mg | ORAL_TABLET | ORAL | 3 refills | Status: AC
  Filled 2022-12-08: qty 30, 60d supply, fill #0

## 2022-12-08 MED ORDER — IBUPROFEN 600 MG PO TABS
600.0000 mg | ORAL_TABLET | Freq: Four times a day (QID) | ORAL | 2 refills | Status: AC | PRN
  Filled 2022-12-08: qty 60, 15d supply, fill #0

## 2022-12-08 MED ORDER — IBUPROFEN 600 MG PO TABS: 600.0000 mg | ORAL_TABLET | Freq: Four times a day (QID) | ORAL | 2 refills | Status: DC | PRN

## 2022-12-08 MED ORDER — SENNOSIDES-DOCUSATE SODIUM 8.6-50 MG PO TABS: 2.0000 | ORAL_TABLET | Freq: Two times a day (BID) | ORAL | 2 refills | Status: DC | PRN

## 2022-12-08 MED ORDER — OXYCODONE HCL 5 MG PO TABS
5.0000 mg | ORAL_TABLET | ORAL | 0 refills | Status: AC | PRN
  Filled 2022-12-08: qty 30, 5d supply, fill #0

## 2022-12-08 MED ORDER — FUROSEMIDE 20 MG PO TABS
20.0000 mg | ORAL_TABLET | Freq: Every day | ORAL | 0 refills | Status: AC
  Filled 2022-12-08: qty 3, 3d supply, fill #0

## 2022-12-08 MED ORDER — FERROUS SULFATE 325 (65 FE) MG PO TABS: 325.0000 mg | ORAL_TABLET | ORAL | 3 refills | Status: DC

## 2022-12-08 MED ORDER — OXYCODONE HCL 5 MG PO TABS: 5.0000 mg | ORAL_TABLET | ORAL | 0 refills | Status: DC | PRN

## 2022-12-08 NOTE — Lactation Note (Signed)
This note was copied from a baby's chart. Lactation Consultation Note  Patient Name: Peggy Mckenzie [id number] Date: 12/08/2022 Baby 52 days old  Reason for consult: [name] assessment;Late-preterm 34-36.6wks;Infant weight loss (per mom milk is in and pumped off 80 ml off each breast this afternoon. LC reviewed BF D/C teaching and the [facility] resources. Per mom plans to work on the breast feeding. LC discussed gradually increasing the volume per feeding.) Maternal Data    Feeding Mother's Current Feeding Choice: Breast Milk and Formula Nipple Type: Extra Slow Flow    Lactation Tools Discussed/Used Tools: Pump Flange Size: 24 Breast pump type: Double-Electric Breast Pump;Manual Pump Education: Milk Storage Pumped volume: 180 mL  Interventions    Discharge Discharge Education: Engorgement and breast care;Warning signs for feeding baby;Outpatient recommendation Pump: Personal;DEBP  Consult Status Consult Status: Complete Date: 12/08/22    Myer Haff 12/08/2022, 4:15 PM

## 2022-12-09 ENCOUNTER — Other Ambulatory Visit: Payer: Self-pay | Admitting: Obstetrics & Gynecology

## 2022-12-16 ENCOUNTER — Telehealth (HOSPITAL_COMMUNITY): Payer: Self-pay | Admitting: *Deleted

## 2022-12-16 NOTE — Telephone Encounter (Signed)
Mom reports feeling good. Incision healing well per mom. No concerns regarding herself at this time. EPDS=4 (hospital score=5) Mom reports baby is well. Feeding, peeing, and pooping without difficulty. Reviewed safe sleep. Mom has no concerns about baby at present.  Odis Hollingshead, RN 12-16-2022 at 3:30pm

## 2022-12-22 ENCOUNTER — Inpatient Hospital Stay (HOSPITAL_COMMUNITY)
Admission: AD | Admit: 2022-12-22 | Discharge: 2022-12-22 | Disposition: A | Discharge status: Home / Self Care | Payer: 59 | Attending: Obstetrics & Gynecology | Admitting: Obstetrics & Gynecology

## 2022-12-22 ENCOUNTER — Encounter (HOSPITAL_COMMUNITY): Payer: Self-pay | Admitting: Obstetrics & Gynecology

## 2022-12-22 DIAGNOSIS — L089 Local infection of the skin and subcutaneous tissue, unspecified: Secondary | ICD-10-CM

## 2022-12-22 DIAGNOSIS — T148XXA Other injury of unspecified body region, initial encounter: Secondary | ICD-10-CM | POA: Diagnosis not present

## 2022-12-22 DIAGNOSIS — O99215 Obesity complicating the puerperium: Secondary | ICD-10-CM | POA: Insufficient documentation

## 2022-12-22 DIAGNOSIS — O99893 Other specified diseases and conditions complicating puerperium: Secondary | ICD-10-CM | POA: Diagnosis present

## 2022-12-22 DIAGNOSIS — Z98891 History of uterine scar from previous surgery: Secondary | ICD-10-CM

## 2022-12-22 DIAGNOSIS — O8600 Infection of obstetric surgical wound, unspecified: Secondary | ICD-10-CM | POA: Diagnosis not present

## 2022-12-22 MED ORDER — OXYCODONE HCL 5 MG PO TABS
10.0000 mg | ORAL_TABLET | Freq: Once | ORAL | Status: AC
  Administered 2022-12-22: 10 mg via ORAL
  Filled 2022-12-22: qty 2

## 2022-12-22 MED ORDER — IBUPROFEN 800 MG PO TABS
400.0000 mg | ORAL_TABLET | Freq: Once | ORAL | Status: AC
  Administered 2022-12-22: 400 mg via ORAL
  Filled 2022-12-22: qty 1

## 2022-12-22 NOTE — MAU Provider Note (Signed)
History     CSN: [id number]  Arrival date and time: 12/22/22 2028   Event Date/Time   First Provider Initiated Contact with Patient 12/22/22 2109      Chief Complaint  Patient presents with   Post-op Problem   HPI Peggy Mckenzie is at [id number] currently about 2.5 weeks post operative for pLTCS at 35 weeks for PEC with active HSV outbreak. Noticed the she had increased discharge from left side of the incision, serosanguinous. Then she developed more pain on the left side of incision with pus drainage to the left of midline.  She reports disturbed gait due to pain. Denies fever, chills.   OB History     Gravida  6   Para  5   Term  3   Preterm  2   AB  1   Living  5      SAB  1   IAB      Ectopic      Multiple  0   Live Births  5           Past Medical History:  Diagnosis Date   Anemia    Bipolar 1 disorder ([place])    Compartment syndrome ([place]) 2006   Left leg   Depression    Fracture 2006   Left  leg requiring rods and screws   Headache    Hemorrhage    post partum hemorrhage   Infection    frequent UTI's   Pregnancy induced hypertension    Pyelonephritis during pregnancy 2021    Past Surgical History:  Procedure Laterality Date   CESAREAN SECTION N/A 12/05/2022   Procedure: CESAREAN SECTION;  Surgeon: Osborne Oman, MD;  Location: [facility] LD ORS;  Service: Obstetrics;  Laterality: N/A;   CYST REMOVAL HAND     CYSTOLITHOTOMY     FRACTURE SURGERY Left    leg   INTRAUTERINE DEVICE (IUD) INSERTION  12/05/2022   Procedure: INTRAUTERINE DEVICE (IUD) INSERTION;  Surgeon: Osborne Oman, MD;  Location: [facility] LD ORS;  Service: Obstetrics;;    Family History  Problem Relation Age of Onset   Birth defects Paternal Aunt        spina bifida   Hypertension Paternal Grandmother     Social History   Tobacco Use   Smoking status: Never   Smokeless tobacco: Never  Vaping Use   Vaping Use: Never used  Substance Use Topics   Alcohol use: No     Comment: occasional,, not now   Drug use: No    Allergies: No Known Allergies  Medications Prior to Admission  Medication Sig Dispense Refill Last Dose   ferrous sulfate 325 (65 FE) MG tablet Take 1 tablet (325 mg total) by mouth every other day. 30 tablet 3 12/21/2022   Prenatal Vit-Fe Fumarate-FA (PRENATAL MULTIVITAMIN) TABS tablet Take 1 tablet by mouth daily at 12 noon.   12/21/2022   sertraline (ZOLOFT) 25 MG tablet Take 25 mg by mouth daily.   12/21/2022   acetaminophen (TYLENOL) 325 MG tablet Take 2 tablets (650 mg total) by mouth every 4 (four) hours as needed (for pain scale < 4).      furosemide (LASIX) 20 MG tablet Take 1 tablet (20 mg total) by mouth daily for 3 days. 3 tablet 0    ibuprofen (ADVIL) 600 MG tablet Take 1 tablet (600 mg total) by mouth every 6 (six) hours as needed for mild pain or moderate pain. 60 tablet 2  oxyCODONE (OXY IR/ROXICODONE) 5 MG immediate release tablet Take 1 tablet (5 mg total) by mouth every 4 (four) hours as needed for severe pain or breakthrough pain. 30 tablet 0    senna-docusate (SENOKOT-S) 8.6-50 MG tablet Take 2 tablets by mouth 2 (two) times daily as needed for mild constipation or moderate constipation. 30 tablet 2     Review of Systems  Constitutional:  Negative for chills and fever.  HENT:  Negative for congestion and sore throat.   Eyes:  Negative for pain and visual disturbance.  Respiratory:  Negative for cough, chest tightness and shortness of breath.   Cardiovascular:  Negative for chest pain.  Gastrointestinal:  Negative for abdominal pain, diarrhea, nausea and vomiting.  Endocrine: Negative for cold intolerance and heat intolerance.  Genitourinary:  Negative for dysuria and flank pain.  Musculoskeletal:  Negative for back pain.  Skin:  Negative for rash.  Allergic/Immunologic: Negative for food allergies.  Neurological:  Negative for dizziness and light-headedness.  Psychiatric/Behavioral:  Negative for agitation.     Physical Exam   Blood pressure 116/70, pulse 72, temperature 99 F (37.2 C), resp. rate 16, height 5' 3"$  (1.6 m), weight 98.9 kg, SpO2 99 %, unknown if currently breastfeeding.  Physical Exam Vitals and nursing note reviewed.  Constitutional:      General: She is not in acute distress.    Appearance: She is well-developed.  HENT:     Head: Normocephalic and atraumatic.  Eyes:     General: No scleral icterus.    Conjunctiva/sclera: Conjunctivae normal.  Cardiovascular:     Rate and Rhythm: Normal rate.  Pulmonary:     Effort: Pulmonary effort is normal.  Chest:     Chest wall: No tenderness.  Abdominal:     Palpations: Abdomen is soft.     Tenderness: There is no abdominal tenderness. There is no guarding or rebound.    Genitourinary:    Vagina: Normal.  Musculoskeletal:        General: Normal range of motion.     Cervical back: Normal range of motion and neck supple.  Skin:    General: Skin is warm and dry.     Findings: No rash.  Neurological:     Mental Status: She is alert and oriented to person, place, and time.    MAU Course  Procedures -pretreated with ibuprofen and oxycodone - Cut two sutures that were above skin - small defect opened left of midline, < 80m in width and depth.  - Flushed area with copious normal saline ~20cc - placed badge  MDM: moderate  This patient presents to the ED for concern of   Chief Complaint  Patient presents with   Post-op Problem     These complains involves an extensive number of treatment options, and is a complaint that carries with it a high risk of complications and morbidity.  The differential diagnosis for  1. Pain at incision INCLUDES deep wound infection, superficial infection, reaction to suture.    Co morbidities that complicate the patient evaluation: obesity, recent c-section  External records from outside source obtained and reviewed including CareEverywhere and Prenatal care records  Lab Tests:  none  Medicines ordered and prescription drug management:  Medications:  Oxycodone and ibuprofen here in MAU   Reevaluation of the patient after these medicines showed that the patient improved I have reviewed the patients home medicines and have made adjustments as needed  Test Considered: CT, not warranted given mild symptoms CBC- not  warranted given lack of systemic symptoms Do no feel antibiotics are warranted as this is a superficial irritation, likely due to exposed suture.   Critical Interventions: Other suture removal and irrigation.    MAU Course: Bedside procedure/wash out of small incision defect  After the interventions noted above, I reevaluated the patient and found that they have :improved  Dispostion: discharged   Assessment and Plan   1. Wound infection   2. S/P cesarean section    - Follow up with outpatient OB - Return for spreading erythema, worsening pain, fever, chills  Juanita Craver [facility] 12/22/2022, 9:14 PM

## 2022-12-22 NOTE — MAU Note (Signed)
.  Peggy Mckenzie is a 32 y.o. at Unknown here in MAU reporting incision pain Sunday night on L side. Had been feeling great until then. Yesterday was draining small amt clear liquid from L side. Today is draining more purulent, bloody drainage and is painful and red. C/S was Jan 27th   Onset of complaint: Sunday night Pain score: 4 Vitals:   12/22/22 2048 12/22/22 2049  BP:  116/70  Pulse: 72   Resp: 16   Temp: 99 F (37.2 C)   SpO2: 99%      FHT:n/a Lab orders placed from triage:  none

## 2023-01-12 ENCOUNTER — Ambulatory Visit: Payer: 59 | Admitting: Obstetrics and Gynecology

## 2023-01-26 ENCOUNTER — Ambulatory Visit: Payer: 59 | Admitting: Advanced Practice Midwife

## 2023-01-30 IMAGING — DX DG CHEST 1V PORT
1 series · 1 of 1 positions shown · non-contrast
Comparison: None.

CLINICAL DATA: Questionable sepsis - evaluate for abnormality

Patient reports right breast pain, tenderness, and warmth. Currently
breast feeding.
EXAM:
PORTABLE CHEST 1 VIEW

[chest ap]
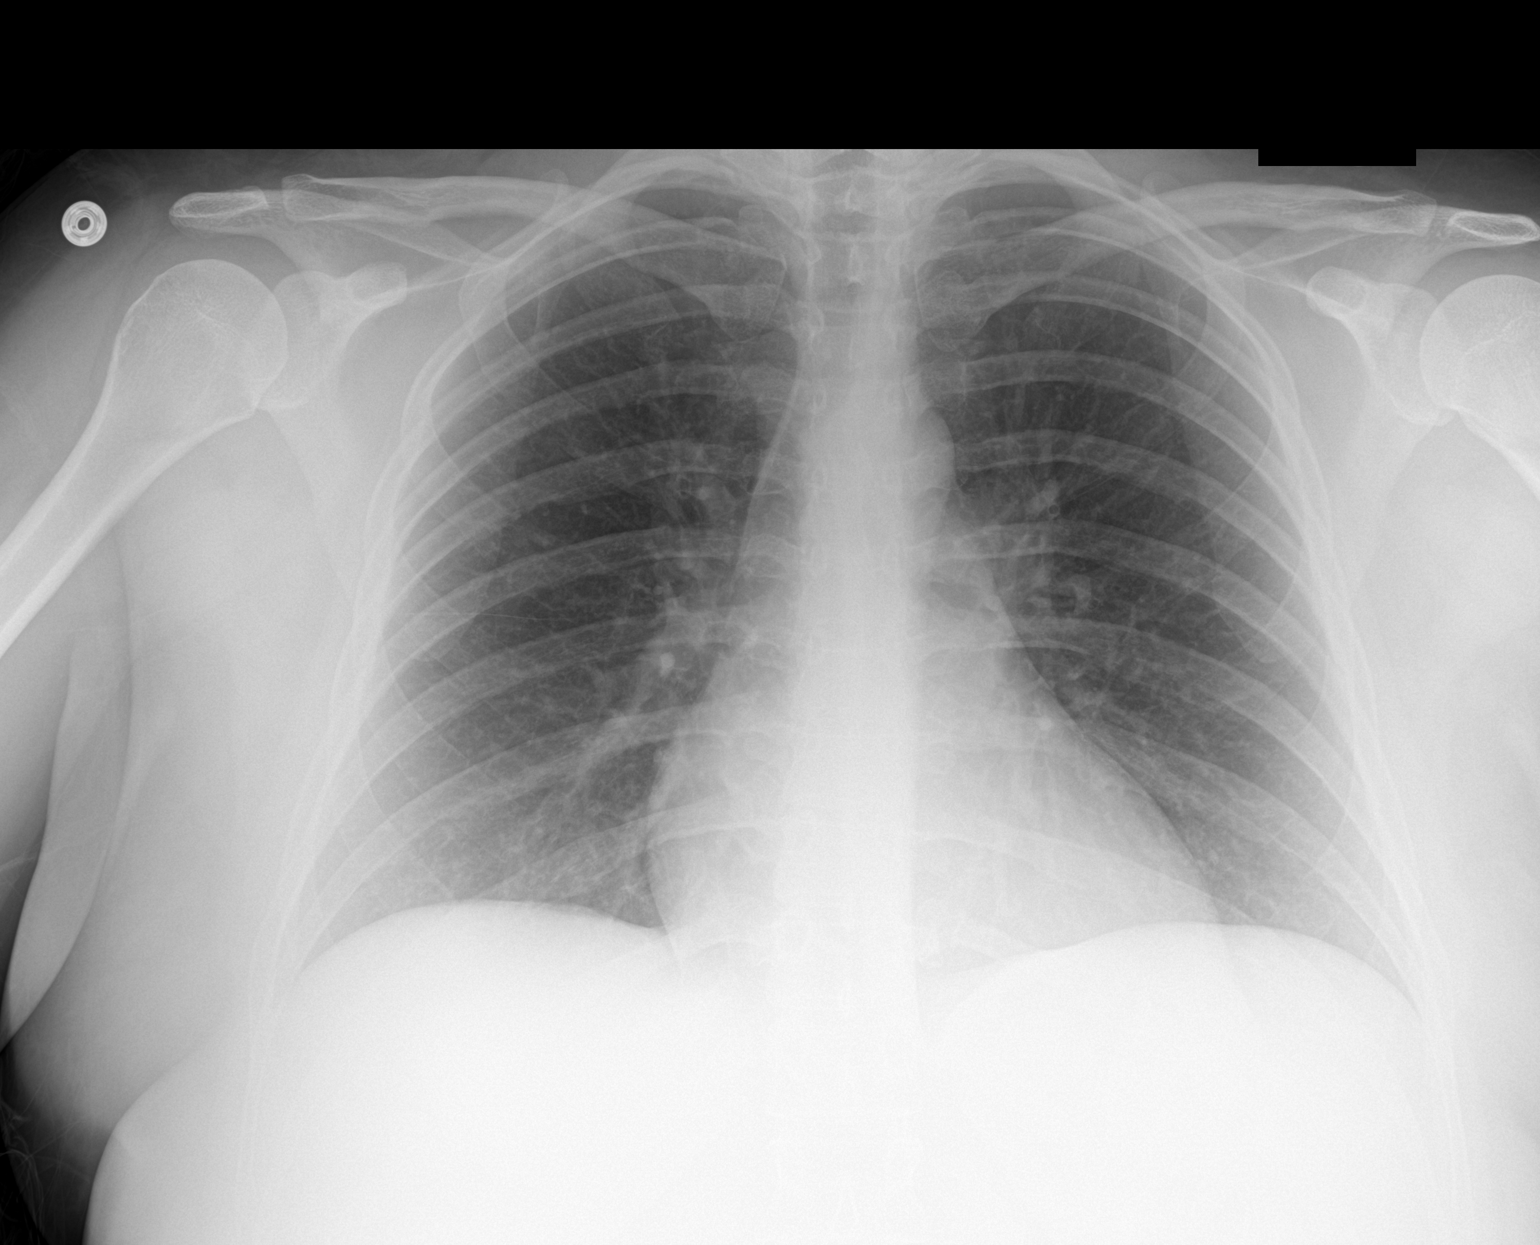

[1 of 1 positions shown; findings below may reference images not displayed]

FINDINGS: The cardiomediastinal contours are normal. The lungs are clear.
Pulmonary vasculature is normal. No consolidation, pleural effusion,
or pneumothorax. No acute osseous abnormalities are seen.
IMPRESSION: Negative portable AP view of the chest.

## 2023-07-23 ENCOUNTER — Emergency Department (HOSPITAL_COMMUNITY)
Admission: EM | Admit: 2023-07-23 | Discharge: 2023-07-24 | Disposition: A | Discharge status: Home / Self Care | Payer: Managed Care, Other (non HMO) | Attending: Emergency Medicine | Admitting: Emergency Medicine

## 2023-07-23 ENCOUNTER — Encounter (HOSPITAL_COMMUNITY): Payer: Self-pay

## 2023-07-23 ENCOUNTER — Other Ambulatory Visit: Payer: Self-pay

## 2023-07-23 ENCOUNTER — Emergency Department (HOSPITAL_COMMUNITY): Payer: Managed Care, Other (non HMO)

## 2023-07-23 DIAGNOSIS — R109 Unspecified abdominal pain: Secondary | ICD-10-CM | POA: Diagnosis present

## 2023-07-23 DIAGNOSIS — N200 Calculus of kidney: Secondary | ICD-10-CM | POA: Diagnosis not present

## 2023-07-23 DIAGNOSIS — D72829 Elevated white blood cell count, unspecified: Secondary | ICD-10-CM | POA: Diagnosis not present

## 2023-07-23 DIAGNOSIS — G8918 Other acute postprocedural pain: Secondary | ICD-10-CM | POA: Diagnosis not present

## 2023-07-23 LAB — COMPREHENSIVE METABOLIC PANEL
ALT: 15 U/L (ref 0–44)
AST: 15 U/L (ref 15–41)
Albumin: 4.2 g/dL (ref 3.5–5.0)
Alkaline Phosphatase: 66 U/L (ref 38–126)
Anion gap: 9 (ref 5–15)
BUN: 17 mg/dL (ref 6–20)
CO2: 27 mmol/L (ref 22–32)
Calcium: 9.2 mg/dL (ref 8.9–10.3)
Chloride: 104 mmol/L (ref 98–111)
Creatinine, Ser: 0.53 mg/dL (ref 0.44–1.00)
GFR, Estimated: >60 mL/min (ref >60)
Glucose, Bld: 96 mg/dL (ref 70–99)
Potassium: 4.4 mmol/L (ref 3.5–5.1)
Sodium: 140 mmol/L (ref 135–145)
Total Bilirubin: 0.5 mg/dL (ref 0.3–1.2)
Total Protein: 7.5 g/dL (ref 6.5–8.1)

## 2023-07-23 LAB — CBC WITH DIFFERENTIAL/PLATELET
Abs Immature Granulocytes: 0.03 10*3/uL (ref 0.00–0.07)
Basophils Absolute: 0.1 10*3/uL (ref 0.0–0.1)
Basophils Relative: 0 %
Eosinophils Absolute: 0.3 10*3/uL (ref 0.0–0.5)
Eosinophils Relative: 2 %
HCT: 40.3 % (ref 36.0–46.0)
Hemoglobin: 12.7 g/dL (ref 12.0–15.0)
Immature Granulocytes: 0 %
Lymphocytes Relative: 35 %
Lymphs Abs: 4 10*3/uL (ref 0.7–4.0)
MCH: 28.1 pg (ref 26.0–34.0)
MCHC: 31.5 g/dL (ref 30.0–36.0)
MCV: 89.2 fL (ref 80.0–100.0)
Monocytes Absolute: 0.8 10*3/uL (ref 0.1–1.0)
Monocytes Relative: 7 %
Neutro Abs: 6.3 10*3/uL (ref 1.7–7.7)
Neutrophils Relative %: 56 %
Platelets: 363 10*3/uL (ref 150–400)
RBC: 4.52 MIL/uL (ref 3.87–5.11)
RDW: 13.2 % (ref 11.5–15.5)
WBC: 11.3 10*3/uL — ABNORMAL HIGH (ref 4.0–10.5)
nRBC: 0 % (ref 0.0–0.2)

## 2023-07-23 LAB — URINALYSIS, ROUTINE W REFLEX MICROSCOPIC
Bacteria, UA: NONE SEEN
Bilirubin Urine: NEGATIVE
Glucose, UA: NEGATIVE mg/dL
Ketones, ur: NEGATIVE mg/dL
Nitrite: NEGATIVE
Protein, ur: 30 mg/dL — AB
RBC / HPF: >50 RBC/hpf (ref 0–5)
Specific Gravity, Urine: 1.013 (ref 1.005–1.030)
pH: 7 (ref 5.0–8.0)

## 2023-07-23 LAB — HCG, QUANTITATIVE, PREGNANCY: hCG, Beta Chain, Quant, S: <1 m[IU]/mL (ref <5)

## 2023-07-23 LAB — I-STAT CG4 LACTIC ACID, ED: Lactic Acid, Venous: 1.8 mmol/L (ref 0.5–1.9)

## 2023-07-23 LAB — LIPASE, BLOOD: Lipase: 30 U/L (ref 11–51)

## 2023-07-23 NOTE — ED Provider Triage Note (Signed)
Emergency Medicine Provider Triage Evaluation Note  Peggy Mckenzie , a 32 y.o. female  was evaluated in triage.  Pt complains of left flank pain that started this morning.  Had a cystoscopy, lithotripsy, and stent placed on 9/10.  Urologist recommended patient report to the ED to rule out stent displacement.  Patient admits to hematuria.  No fever however, shivering in triage.  Review of Systems  Positive: Flank pain Negative: CP  Physical Exam  BP (!) 148/97 (BP Location: Right Arm)   Pulse 76   Temp 99.1 F (37.3 C) (Oral)   Resp 18   Ht 5\' 3"  (1.6 m)   Wt 97.5 kg   SpO2 100%   BMI 38.09 kg/m  Gen:   Awake, no distress   Resp:  Normal effort  MSK:   Moves extremities without difficulty  Other:    Medical Decision Making  Medically screening exam initiated at 7:13 PM.  Appropriate orders placed.  Peggy Mckenzie was informed that the remainder of the evaluation will be completed by another provider, this initial triage assessment does not replace that evaluation, and the importance of remaining in the ED until their evaluation is complete.  Labs CT renal study   Jesusita Oka 07/23/23 1914

## 2023-07-23 NOTE — ED Triage Notes (Signed)
Cystoscopy, lithotripsy, and stent placement in left kidney on Tuesday. Pt was doing well at home and then pain started this AM around 0800. Pain has progressively gotten worse. Surgeon told pt to come to ED. Denies N/v.

## 2023-07-24 DIAGNOSIS — G8918 Other acute postprocedural pain: Secondary | ICD-10-CM | POA: Diagnosis not present

## 2023-07-24 MED ORDER — KETOROLAC TROMETHAMINE 15 MG/ML IJ SOLN
15.0000 mg | Freq: Once | INTRAMUSCULAR | Status: AC
  Administered 2023-07-24: 15 mg via INTRAVENOUS
  Filled 2023-07-24: qty 1

## 2023-07-24 NOTE — Discharge Instructions (Signed)
Thank you for allowing [place] to take care of you today.  We hope you begin feeling better soon.  To-Do: Please follow-up with your Urologist Please return to the Emergency Department or call 911 if you experience chest pain, shortness of breath, severe pain, severe fever, altered mental status, or have any reason to think that you need emergency medical care.  Thank you again.  Hope you feel better soon.  Department of Emergency Medicine [facility]

## 2023-07-24 NOTE — ED Provider Notes (Signed)
[facility] Provider Note  CSN: [id number] Arrival date & time: 07/23/23 1846  Chief Complaint(s) Post-op Problem and Flank Pain  HPI Peggy Mckenzie is a 32 y.o. female with a past medical history listed below including renal stones requiring lithotripsy and sided ureteral stent who presents to the emergency department with worsening left flank pain.  Pain not controlled with prescribed diclofenac or Tylenol.  No associated nausea or vomiting.  She denies any dysuria.  No abdominal pain, diarrhea, constipation.  No other physical complaints.   Flank Pain    Past Medical History Past Medical History:  Diagnosis Date   Anemia    Bipolar 1 disorder (HCC)    Compartment syndrome (HCC) 2006   Left leg   Depression    Fracture 2006   Left  leg requiring rods and screws   Headache    Hemorrhage    post partum hemorrhage   Infection    frequent UTI's   Pregnancy induced hypertension    Pyelonephritis during pregnancy 2021   Patient Active Problem List   Diagnosis Date Noted   Postoperative anemia due to acute blood loss 12/07/2022   S/P cesarean section 12/05/2022   Bipolar depression (HCC) 05/11/2021   HSV infection 05/11/2021   Preeclampsia, severe, third trimester 05/11/2021   Home Medication(s) Prior to Admission medications   Medication Sig Start Date End Date Taking? Authorizing Provider  amoxicillin-clavulanate (AUGMENTIN) 875-125 MG tablet Take 1 tablet by mouth 2 (two) times daily. 07/10/23  Yes [provider]  diclofenac (VOLTAREN) 75 MG EC tablet Take 75 mg by mouth 2 (two) times daily.   Yes [provider]  oxybutynin (DITROPAN-XL) 5 MG 24 hr tablet Take 5 mg by mouth at bedtime.   Yes [provider]  tamsulosin (FLOMAX) 0.4 MG CAPS capsule Take 0.4 mg by mouth at bedtime.   Yes [provider]  acetaminophen (TYLENOL) 325 MG tablet Take 2 tablets (650 mg total) by mouth every  4 (four) hours as needed (for pain scale < 4). Patient not taking: Reported on 07/24/2023 05/15/21   Holshouser, Gerhard Munch, CNM  ferrous sulfate 325 (65 FE) MG tablet Take 1 tablet (325 mg total) by mouth every other day. Patient not taking: Reported on 07/24/2023 12/08/22   Tereso Newcomer, MD  furosemide (LASIX) 20 MG tablet Take 1 tablet (20 mg total) by mouth daily for 3 days. Patient not taking: Reported on 07/24/2023 12/08/22 07/24/23  Tereso Newcomer, MD  ibuprofen (ADVIL) 600 MG tablet Take 1 tablet (600 mg total) by mouth every 6 (six) hours as needed for mild pain or moderate pain. Patient not taking: Reported on 07/24/2023 12/08/22   Anyanwu, Jethro Bastos, MD  oxyCODONE (OXY IR/ROXICODONE) 5 MG immediate release tablet Take 1 tablet (5 mg total) by mouth every 4 (four) hours as needed for severe pain or breakthrough pain. Patient not taking: Reported on 07/24/2023 12/08/22   Anyanwu, Jethro Bastos, MD  senna-docusate (SENOKOT-S) 8.6-50 MG tablet Take 2 tablets by mouth 2 (two) times daily as needed for mild constipation or moderate constipation. Patient not taking: Reported on 07/24/2023 12/08/22   Tereso Newcomer, MD  lisdexamfetamine (VYVANSE) 20 MG capsule 1 capsule in the morning Orally Once a day 01/29/22 02/26/22  Allergies Patient has no known allergies.  Review of Systems Review of Systems  Genitourinary:  Positive for flank pain.   As noted in HPI  Physical Exam Vital Signs  I have reviewed the triage vital signs BP 119/71   Pulse (!) 58   Temp 98.2 F (36.8 C) (Oral)   Resp 16   Ht 5\' 3"  (1.6 m)   Wt 97.5 kg   SpO2 96%   BMI 38.09 kg/m   Physical Exam Vitals reviewed.  Constitutional:      General: She is not in acute distress.    Appearance: She is well-developed. She is not diaphoretic.  HENT:     Head: Normocephalic and atraumatic.      Right Ear: External ear normal.     Left Ear: External ear normal.     Nose: Nose normal.  Eyes:     General: No scleral icterus.    Conjunctiva/sclera: Conjunctivae normal.  Neck:     Trachea: Phonation normal.  Cardiovascular:     Rate and Rhythm: Normal rate and regular rhythm.  Pulmonary:     Effort: Pulmonary effort is normal. No respiratory distress.     Breath sounds: No stridor.  Abdominal:     General: There is no distension.     Tenderness: There is no abdominal tenderness. There is left CVA tenderness.  Musculoskeletal:        General: Normal range of motion.     Cervical back: Normal range of motion.  Neurological:     Mental Status: She is alert and oriented to person, place, and time.  Psychiatric:        Behavior: Behavior normal.     ED Results and Treatments Labs (all labs ordered are listed, but only abnormal results are displayed) Labs Reviewed  CBC WITH DIFFERENTIAL/PLATELET - Abnormal; Notable for the following components:      Result Value   WBC 11.3 (*)    All other components within normal limits  URINALYSIS, ROUTINE W REFLEX MICROSCOPIC - Abnormal; Notable for the following components:   Color, Urine STRAW (*)    APPearance HAZY (*)    Hgb urine dipstick LARGE (*)    Protein, ur 30 (*)    Leukocytes,Ua SMALL (*)    All other components within normal limits  COMPREHENSIVE METABOLIC PANEL  LIPASE, BLOOD  HCG, QUANTITATIVE, PREGNANCY  I-STAT CG4 LACTIC ACID, ED                                                                                                                         EKG  EKG Interpretation Date/Time:    Ventricular Rate:    PR Interval:    QRS Duration:    QT Interval:    QTC Calculation:   R Axis:      Text Interpretation:         Radiology CT Renal Stone Study  Result Date: 07/23/2023 CLINICAL DATA:  History of recent lithotripsy and stent  placement on the left with increasing pain, initial encounter EXAM: CT ABDOMEN  AND PELVIS WITHOUT CONTRAST TECHNIQUE: Multidetector CT imaging of the abdomen and pelvis was performed following the standard protocol without IV contrast. RADIATION DOSE REDUCTION: This exam was performed according to the departmental dose-optimization program which includes automated exposure control, adjustment of the mA and/or kV according to patient size and/or use of iterative reconstruction technique. COMPARISON:  07/16/2020 FINDINGS: Lower chest: No acute abnormality. Hepatobiliary: No focal liver abnormality is seen. No gallstones, gallbladder wall thickening, or biliary dilatation. Pancreas: Unremarkable. No pancreatic ductal dilatation or surrounding inflammatory changes. Spleen: Normal in size without focal abnormality. Adrenals/Urinary Tract: Adrenal glands are within normal limits. Right kidney shows punctate stone in the lower pole without obstructive change. Left kidney demonstrates a few stones measuring 2 mm or less with ureteral stent in place. No obstructive changes are seen. No ureteral stones are noted. The bladder is partially distended. Stomach/Bowel: Colon shows no obstructive changes. Very mild wall thickening and pericolonic inflammatory changes noted in the mid descending colon. This may represent some very early changes of diverticulitis. The appendix is air-filled and within normal limits. Small bowel and stomach are within normal limits. Vascular/Lymphatic: No significant vascular findings are present. No enlarged abdominal or pelvic lymph nodes. Reproductive: Uterus and bilateral adnexa are unremarkable. IUD is noted in place. Other: No abdominal wall hernia or abnormality. No abdominopelvic ascites. Musculoskeletal: No acute or significant osseous findings. IMPRESSION: Scattered nonobstructing stones are noted bilaterally. Left ureteral stent in place without evidence of obstruction. Mild wall thickening and pericolonic inflammatory change in the mid descending colon which may  represent some very early diverticulitis. Electronically Signed   By: Alcide Clever M.D.   On: 07/23/2023 22:13    Medications Ordered in ED Medications  ketorolac (TORADOL) 15 MG/ML injection 15 mg (15 mg Intravenous Given 07/24/23 0322)   Procedures Procedures  (including critical care time) Medical Decision Making / ED Course   Medical Decision Making Amount and/or Complexity of Data Reviewed Labs: ordered.  Risk Prescription drug management.    Left flank pain. Differential diagnosis listed below  Will need to assess for evidence of urinary tract infection/pyelonephritis.  Will assess for complication from the recent ureteral stent.  Also considering new drop stone.  CBC with mild leukocytosis.  No anemia. Metabolic panel without significant electrolyte derangements or renal insufficiency.  No evidence of bili obstruction or pancreatitis. hCG obtained to assist in guiding care and ruling out pregnancy related process which was negative. UA with leukocytes and RBCs but no bacteria in your urine.  Not suspicious for infection.  Likely inflammatory changes from stenting. CT scan showed well-placed stent without obstruction.  Additionally did reveal mild wall thickening and pericolonic inflammatory changes concerning for early diverticulitis.  Patient is not complaining of any abdominal pain and does not have any tenderness to palpation that would fit with this clinical picture.  These changes may be related to recent lithotripsy/stenting.  Pain was controlled with a single dose of IV Toradol.  No need for antibiotics at this time.  Recommended close monitoring for any new abdominal pain that would be concerning for diverticulitis.  Strict return precautions given.    Final Clinical Impression(s) / ED Diagnoses Final diagnoses:  Post-operative pain  Left flank pain   The patient appears reasonably screened and/or stabilized for discharge and I doubt any other medical condition  or other [facility] requiring further screening, evaluation, or treatment in the ED at this time.  I have discussed the findings, Dx and Tx plan with the patient/family who expressed understanding and agree(s) with the plan. Discharge instructions discussed at length. The patient/family was given strict return precautions who verbalized understanding of the instructions. No further questions at time of discharge.  Disposition: Discharge  Condition: Good  ED Discharge Orders     None        Follow Up: Primary care provider  Call  to schedule an appointment for close follow up  Urology  Call      This chart was dictated using voice recognition software.  Despite best efforts to proofread,  errors can occur which can change the documentation meaning.    Nira Conn, MD 07/24/23 [phone]

## 2024-04-05 ENCOUNTER — Encounter: Payer: Self-pay | Admitting: Sports Medicine

## 2024-04-05 ENCOUNTER — Other Ambulatory Visit: Payer: Self-pay | Admitting: Sports Medicine

## 2024-04-05 DIAGNOSIS — M79662 Pain in left lower leg: Secondary | ICD-10-CM

## 2024-04-10 ENCOUNTER — Ambulatory Visit
Admission: RE | Admit: 2024-04-10 | Discharge: 2024-04-10 | Disposition: A | Discharge status: Home / Self Care | Source: Ambulatory Visit | Attending: Sports Medicine | Admitting: Sports Medicine

## 2024-04-10 DIAGNOSIS — M79662 Pain in left lower leg: Secondary | ICD-10-CM
